# Patient Record
Sex: Male | Born: 1935 | Race: Black or African American | Hispanic: No | Marital: Married | Smoking: Former smoker
Health system: Southern US, Community
[De-identification: ages and names within clinical notes are randomized; demographics above are authoritative.]

## PROBLEM LIST (undated history)

## (undated) DIAGNOSIS — I469 Cardiac arrest, cause unspecified: Secondary | ICD-10-CM

## (undated) DIAGNOSIS — I10 Essential (primary) hypertension: Secondary | ICD-10-CM

## (undated) DIAGNOSIS — J9621 Acute and chronic respiratory failure with hypoxia: Secondary | ICD-10-CM

## (undated) DIAGNOSIS — U071 COVID-19: Secondary | ICD-10-CM

## (undated) DIAGNOSIS — I482 Chronic atrial fibrillation, unspecified: Secondary | ICD-10-CM

## (undated) HISTORY — PX: HIP SURGERY: SHX245

---

## 2001-10-08 ENCOUNTER — Ambulatory Visit (HOSPITAL_COMMUNITY): Admission: RE | Admit: 2001-10-08 | Discharge: 2001-10-08 | Payer: Self-pay | Admitting: Family Medicine

## 2001-10-08 ENCOUNTER — Encounter: Payer: Self-pay | Admitting: Family Medicine

## 2001-10-15 ENCOUNTER — Ambulatory Visit (HOSPITAL_COMMUNITY): Admission: RE | Admit: 2001-10-15 | Discharge: 2001-10-15 | Payer: Self-pay | Admitting: Orthopaedic Surgery

## 2001-10-15 ENCOUNTER — Encounter: Payer: Self-pay | Admitting: Orthopaedic Surgery

## 2014-02-23 ENCOUNTER — Ambulatory Visit (HOSPITAL_COMMUNITY)
Admission: RE | Admit: 2014-02-23 | Discharge: 2014-02-23 | Disposition: A | Discharge status: Home / Self Care | Payer: Medicare Other | Source: Ambulatory Visit | Attending: Family Medicine | Admitting: Family Medicine

## 2014-02-23 ENCOUNTER — Other Ambulatory Visit (HOSPITAL_COMMUNITY): Payer: Self-pay | Admitting: Family Medicine

## 2014-02-23 DIAGNOSIS — M25552 Pain in left hip: Secondary | ICD-10-CM | POA: Insufficient documentation

## 2014-02-23 DIAGNOSIS — M2550 Pain in unspecified joint: Secondary | ICD-10-CM

## 2014-03-10 ENCOUNTER — Encounter: Payer: Self-pay | Admitting: Orthopedic Surgery

## 2014-04-06 ENCOUNTER — Ambulatory Visit: Payer: Medicare Other | Admitting: Orthopedic Surgery

## 2014-04-07 ENCOUNTER — Ambulatory Visit (INDEPENDENT_AMBULATORY_CARE_PROVIDER_SITE_OTHER): Payer: Medicare Other | Admitting: Orthopedic Surgery

## 2014-04-07 VITALS — BP 155/92 | Ht 74.0 in | Wt 216.4 lb

## 2014-04-07 DIAGNOSIS — M1612 Unilateral primary osteoarthritis, left hip: Secondary | ICD-10-CM

## 2014-04-07 DIAGNOSIS — M5136 Other intervertebral disc degeneration, lumbar region: Secondary | ICD-10-CM

## 2014-04-07 NOTE — Patient Instructions (Signed)
CALL [facility] TO SET UP HOME EXERCISE PROGRAM
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
## 2014-10-12 ENCOUNTER — Ambulatory Visit: Payer: Medicare Other | Admitting: Orthopedic Surgery

## 2014-10-12 ENCOUNTER — Encounter: Payer: Self-pay | Admitting: Orthopedic Surgery

## 2018-06-08 ENCOUNTER — Other Ambulatory Visit: Payer: Self-pay

## 2018-06-08 ENCOUNTER — Encounter (HOSPITAL_COMMUNITY): Payer: Self-pay

## 2018-06-08 ENCOUNTER — Emergency Department (HOSPITAL_COMMUNITY)
Admission: EM | Admit: 2018-06-08 | Discharge: 2018-06-08 | Disposition: A | Discharge status: Home / Self Care | Payer: Medicare HMO | Attending: Emergency Medicine | Admitting: Emergency Medicine

## 2018-06-08 DIAGNOSIS — R42 Dizziness and giddiness: Secondary | ICD-10-CM

## 2018-06-08 DIAGNOSIS — W57XXXA Bitten or stung by nonvenomous insect and other nonvenomous arthropods, initial encounter: Secondary | ICD-10-CM | POA: Insufficient documentation

## 2018-06-08 DIAGNOSIS — S40862A Insect bite (nonvenomous) of left upper arm, initial encounter: Secondary | ICD-10-CM | POA: Diagnosis not present

## 2018-06-08 DIAGNOSIS — I10 Essential (primary) hypertension: Secondary | ICD-10-CM | POA: Insufficient documentation

## 2018-06-08 DIAGNOSIS — Y999 Unspecified external cause status: Secondary | ICD-10-CM | POA: Diagnosis not present

## 2018-06-08 DIAGNOSIS — Y929 Unspecified place or not applicable: Secondary | ICD-10-CM | POA: Insufficient documentation

## 2018-06-08 DIAGNOSIS — Y939 Activity, unspecified: Secondary | ICD-10-CM | POA: Diagnosis not present

## 2018-06-08 HISTORY — DX: Essential (primary) hypertension: I10

## 2018-06-08 NOTE — ED Triage Notes (Addendum)
Pt reports something bit him under r arm a few days ago.  Reports area is tender and he has been having some dizziness.    Pt says he fell due to dizziness while playing golf yesterday.  Denies injury.  Denies fever.

## 2018-06-08 NOTE — Discharge Instructions (Addendum)
Take 1/2 your blood pressure medication a day. Follow up with your primary care doctor this week for blood pressure recheck and recheck of your dizziness.  Make sure you are staying well hydrated. Your urine should be clear to pale yellow.  Monitor your armpit for signs of infection - look for pain, swelling, redness.  Return to the ER if you develop fevers, rash, chest pain, worsening, dizziness, or any new, worsening, or concerning symptoms.

## 2018-06-08 NOTE — ED Notes (Signed)
Pt ambulated well without assistance. Pt denies dizziness when ambulated.

## 2018-06-08 NOTE — ED Provider Notes (Signed)
[facility] EMERGENCY DEPARTMENT Provider Note   CSN: [id number] Arrival date & time: 06/08/18  [id number]    History   Chief Complaint Chief Complaint  Patient presents with  . Insect Bite    HPI Robert Carpenter is a 85 y.o. male presenting for evaluation of insect bite and dizziness.  Patient states 2 days ago he started to have itching under his right axilla.  He thinks he was bitten by something.  He reports persistent itching.  He denies pain, erythema, swelling.  He has not applied anything to this area.  He denies bite elsewhere. Additionally, patient states 2 days ago he started to have intermittent dizziness.  Dizziness is worse when he changes position, such as leaning forward or quickly turning his head.  It is described as a combination of feeling dizzy and off-balance.  No dizziness at rest.  He denies headache, vision changes, slurred speech, chest pain, shortness of breath, nausea, vomiting, cough, fevers.  He denies rashes.  He was started on lisinopril 10 mg 4 days ago, prior to this was not on any blood pressure medication.  He also takes Viagra intermittently, had a dose yesterday but none recently before that. He denies any changes, urinary symptoms, abnormal bowel movements.  He denies sick contacts at home.     HPI  Past Medical History:  Diagnosis Date  . Hypertension     There are no active problems to display for this patient.   Past Surgical History:  Procedure Laterality Date  . HIP SURGERY          Home Medications    Prior to Admission medications   Medication Sig Start Date End Date Taking? Authorizing Provider  IBUPROFEN PO Take by mouth.    [provider]    Family History No family history on file.  Social History Social History   Tobacco Use  . Smoking status: Never Smoker  . Smokeless tobacco: Never Used  Substance Use Topics  . Alcohol use: Never    Frequency: Never  . Drug use: Never     Allergies   Patient has  no known allergies.   Review of Systems Review of Systems  Skin:       Insect R axilla  Neurological: Positive for dizziness (intermittent).  All other systems reviewed and are negative.    Physical Exam Updated Vital Signs BP (!) 152/64 (BP Location: Left Arm)   Pulse (!) 58   Temp 98.6 F (37 C) (Oral)   Resp (!) 22   Ht 6\' 1"  (1.854 m)   Wt 88 kg   SpO2 99%   BMI 25.60 kg/m   Physical Exam Vitals signs and nursing note reviewed.  Constitutional:      General: He is not in acute distress.    Appearance: He is well-developed.     Comments: Resting comfortable in the bed in NAD  HENT:     Head: Normocephalic and atraumatic.  Eyes:     Extraocular Movements: Extraocular movements intact.     Conjunctiva/sclera: Conjunctivae normal.     Pupils: Pupils are equal, round, and reactive to light.     Comments: EOMI and PERRLA. No nystagmus  Neck:     Musculoskeletal: Normal range of motion and neck supple.  Cardiovascular:     Rate and Rhythm: Normal rate and regular rhythm.     Pulses: Normal pulses.  Pulmonary:     Effort: Pulmonary effort is normal. No respiratory distress.  Breath sounds: Normal breath sounds. No wheezing.     Comments: Clear lung sounds in all fields Abdominal:     General: There is no distension.     Palpations: Abdomen is soft. There is no mass.     Tenderness: There is no abdominal tenderness. There is no guarding or rebound.  Musculoskeletal: Normal range of motion.     Comments: Strength and sensation intact x4.   Skin:    General: Skin is warm and dry.     Capillary Refill: Capillary refill takes less than 2 seconds.     Findings: No rash.     Comments: No rash noted.  2 mm tick of R axilla. No surrounding tenderness, inflammation, erythema, or induration.   Neurological:     General: No focal deficit present.     Mental Status: He is alert and oriented to person, place, and time.     GCS: GCS eye subscore is 4. GCS verbal subscore  is 5. GCS motor subscore is 6.     Cranial Nerves: Cranial nerves are intact.     Sensory: Sensation is intact.     Motor: Motor function is intact.     Coordination: Coordination is intact.     Gait: Gait is intact.      ED Treatments / Results  Labs (all labs ordered are listed, but only abnormal results are displayed) Labs Reviewed - No data to display  EKG EKG Interpretation  Date/Time:  Sunday June 08 2018 09:18:22 EDT Ventricular Rate:  54 PR Interval:    QRS Duration: 157 QT Interval:  469 QTC Calculation: 445 R Axis:   -52 Text Interpretation:  Sinus rhythm Prolonged PR interval Left bundle branch block Confirmed by Nat Christen [phone]) on 06/08/2018 10:45:27 AM   Radiology No results found.  Procedures .Foreign Body Removal Date/Time: 06/08/2018 9:58 AM Performed by: Franchot Heidelberg, PA-C Authorized by: Franchot Heidelberg, PA-C  Consent: Verbal consent obtained. Risks and benefits: risks, benefits and alternatives were discussed Consent given by: patient Patient understanding: patient states understanding of the procedure being performed Patient consent: the patient's understanding of the procedure matches consent given Procedure consent: procedure consent matches procedure scheduled Relevant documents: relevant documents present and verified Patient identity confirmed: verbally with patient Intake: R axilla. Anesthesia method: none.  Sedation: Patient sedated: no  Patient restrained: no Complexity: simple 1 objects recovered. Objects recovered: tick Post-procedure assessment: foreign body removed Patient tolerance: Patient tolerated the procedure well with no immediate complications   (including critical care time)  Medications Ordered in ED Medications - No data to display   Initial Impression / Assessment and Plan / ED Course  I have reviewed the triage vital signs and the nursing notes.  Pertinent labs & imaging results that were available  during my care of the patient were reviewed by me and considered in my medical decision making (see chart for details).        Pt presenting for intermittent dizziness and insect bite. physical exam shows tick of the R axilla without signs of infection. Neuro exam reassuring, no deficits. As he recently started BP meds, postural dizziness like 2/2 new medication, lower suspicion that it is due to tick bite.   Tick removed as described above. No fever, rash, or signs of RMSF. Case discussed with attending, Dr. Lacinda Axon evaluated the pt. EKG shows LBBB, no previous to compare. Pt without any ACS sxs such as cp or SOB. Doubt ACS or cardiac etiology in the setting  of intermitted dizziness with new BP mediation. Discussed with attending, Dr. Adriana Simasook agrees to hold on further blood work or cardiac workup. Will do orthostatics and ambulate pt. If reassuring,w ill d/c with 1/2 BP med dose and close follow up with PCP.    Pt ambulated without difficulty or dizziness. Orthostatics reassuring. Discussed findings and plan with pt. discussed taking 1/2 bp med, and f/u with pcp. Strict return precautions given. At this time, pt appears safe for d/c. Pt states he understands and agrees to plan.    Final Clinical Impressions(s) / ED Diagnoses   Final diagnoses:  Tick bite, initial encounter  Dizziness    ED Discharge Orders    None       Alveria ApleyCaccavale, Quantel Mcinturff, PA-C 06/08/18 1123    Donnetta Hutchingook, Brian, MD 06/08/18 1321

## 2018-09-02 ENCOUNTER — Ambulatory Visit (INDEPENDENT_AMBULATORY_CARE_PROVIDER_SITE_OTHER): Payer: Medicare HMO | Admitting: Orthopaedic Surgery

## 2018-09-02 ENCOUNTER — Other Ambulatory Visit: Payer: Self-pay

## 2018-09-02 ENCOUNTER — Ambulatory Visit: Payer: Medicare HMO

## 2018-09-02 ENCOUNTER — Encounter: Payer: Self-pay | Admitting: Orthopaedic Surgery

## 2018-09-02 VITALS — BP 152/78 | HR 50 | Temp 98.0°F | Ht 74.25 in | Wt 192.1 lb

## 2018-09-02 DIAGNOSIS — M25562 Pain in left knee: Secondary | ICD-10-CM

## 2018-09-02 DIAGNOSIS — G8929 Other chronic pain: Secondary | ICD-10-CM | POA: Diagnosis not present

## 2018-09-02 NOTE — Progress Notes (Signed)
Subjective:    Patient ID: Robert Carpenter, male    DOB: 1933-03-06, 85 y.o.   MRN: [id number]  HPI Robert Carpenter has had left knee pain for six to eight months getting gradually worse. Robert Carpenter has no trauma, no redness, no numbness.  Robert Carpenter has swelling and popping.  Robert Carpenter has tried heat, ice, Tylenol with no help.  It is no better. The right knee hurts at times.     Review of Systems  Constitutional: Positive for activity change.  Musculoskeletal: Positive for gait problem and joint swelling.  All other systems reviewed and are negative.  For Review of Systems, all other systems reviewed and are negative.  The following is a summary of the past history medically, past history surgically, known current medicines, social history and family history.  This information is gathered electronically by the computer from prior information and documentation.  I review this each visit and have found including this information at this point in the chart is beneficial and informative.   Past Medical History:  Diagnosis Date  . Hypertension     Past Surgical History:  Procedure Laterality Date  . HIP SURGERY      Current Outpatient Medications on File Prior to Visit  Medication Sig Dispense Refill  . IBUPROFEN PO Take by mouth.     No current facility-administered medications on file prior to visit.     Social History   Socioeconomic History  . Marital status: Married    Spouse name: Not on file  . Number of children: Not on file  . Years of education: Not on file  . Highest education level: Not on file  Occupational History  . Not on file  Social Needs  . Financial resource strain: Not on file  . Food insecurity    Worry: Not on file    Inability: Not on file  . Transportation needs    Medical: Not on file    Non-medical: Not on file  Tobacco Use  . Smoking status: Never Smoker  . Smokeless tobacco: Never Used  Substance and Sexual Activity  . Alcohol use: Never    Frequency: Never  . Drug  use: Never  . Sexual activity: Not on file  Lifestyle  . Physical activity    Days per week: Not on file    Minutes per session: Not on file  . Stress: Not on file  Relationships  . Social Herbalist on phone: Not on file    Gets together: Not on file    Attends religious service: Not on file    Active member of club or organization: Not on file    Attends meetings of clubs or organizations: Not on file    Relationship status: Not on file  . Intimate partner violence    Fear of current or ex partner: Not on file    Emotionally abused: Not on file    Physically abused: Not on file    Forced sexual activity: Not on file  Other Topics Concern  . Not on file  Social History Narrative  . Not on file    History reviewed. No pertinent family history.  BP (!) 152/78   Pulse (!) 50   Temp 98 F (36.7 C)   Ht 6' 2.25" (1.886 m)   Wt 192 lb 2 oz (87.1 kg)   BMI 24.50 kg/m   Body mass index is 24.5 kg/m.     Objective:   Physical Exam Vitals  signs reviewed.  Constitutional:      Appearance: Robert Carpenter is well-developed.  HENT:     Head: Normocephalic and atraumatic.  Eyes:     Conjunctiva/sclera: Conjunctivae normal.     Pupils: Pupils are equal, round, and reactive to light.  Neck:     Musculoskeletal: Normal range of motion and neck supple.  Cardiovascular:     Rate and Rhythm: Normal rate and regular rhythm.  Pulmonary:     Effort: Pulmonary effort is normal.  Abdominal:     Palpations: Abdomen is soft.  Musculoskeletal:       Legs:  Skin:    General: Skin is warm and dry.  Neurological:     Mental Status: Robert Carpenter is alert and oriented to person, place, and time.     Cranial Nerves: No cranial nerve deficit.     Motor: No abnormal muscle tone.     Coordination: Coordination normal.     Deep Tendon Reflexes: Reflexes are normal and symmetric. Reflexes normal.  Psychiatric:        Behavior: Behavior normal.        Thought Content: Thought content normal.         Judgment: Judgment normal.    X-rays were done of the left knee, reported separately.  Robert Carpenter has moderate DJD more medially.       Assessment & Plan:   Encounter Diagnosis  Name Primary?  . Chronic pain of left knee Yes   PROCEDURE NOTE:  The patient request injection, verbal consent was obtained.  The left knee was prepped appropriately after time out was performed.   Sterile technique was observed and anesthesia was provided by ethyl chloride and a 20-gauge needle was used to inject the knee area.  A 16-gauge needle was then used to aspirate the knee.  Color of fluid aspirated was straw  Total cc's aspirated was 20.    Injection of 1 cc of Depo-Medrol 40 mg with several cc's of plain xylocaine was then performed.  A band aid dressing was applied.  The patient was advised to apply ice later today and tomorrow to the injection sight as needed.  I showed him the x-rays and explained them to him.  Return in three weeks.  I told him to take one Aleve bid.  Call if any problem.  Precautions discussed.   Electronically Signed Darreld McleanWayne Farra Nikolic, MD 9/1/202011:03 AM

## 2018-09-23 ENCOUNTER — Other Ambulatory Visit: Payer: Self-pay

## 2018-09-23 ENCOUNTER — Ambulatory Visit (INDEPENDENT_AMBULATORY_CARE_PROVIDER_SITE_OTHER): Payer: Medicare HMO | Admitting: Orthopaedic Surgery

## 2018-09-23 ENCOUNTER — Encounter: Payer: Self-pay | Admitting: Orthopaedic Surgery

## 2018-09-23 VITALS — BP 150/84 | HR 58 | Temp 97.9°F | Ht 74.0 in | Wt 191.0 lb

## 2018-09-23 DIAGNOSIS — G8929 Other chronic pain: Secondary | ICD-10-CM | POA: Diagnosis not present

## 2018-09-23 DIAGNOSIS — M25562 Pain in left knee: Secondary | ICD-10-CM

## 2018-09-23 NOTE — Progress Notes (Signed)
CC:  My knee is much better  He has little pain of the left knee.  He has no swelling.  He is walking well.  The injection helped.  Encounter Diagnosis  Name Primary?  . Chronic pain of left knee Yes   I will see as needed.  Call if any problem.  Precautions discussed.   Electronically Signed Sanjuana Kava, MD 9/22/20209:31 AM

## 2019-01-13 DIAGNOSIS — I10 Essential (primary) hypertension: Secondary | ICD-10-CM | POA: Diagnosis not present

## 2019-01-13 DIAGNOSIS — Z9119 Patient's noncompliance with other medical treatment and regimen: Secondary | ICD-10-CM | POA: Diagnosis not present

## 2019-01-13 DIAGNOSIS — K5900 Constipation, unspecified: Secondary | ICD-10-CM | POA: Diagnosis not present

## 2019-04-29 DIAGNOSIS — H6693 Otitis media, unspecified, bilateral: Secondary | ICD-10-CM | POA: Diagnosis not present

## 2019-04-29 DIAGNOSIS — H9203 Otalgia, bilateral: Secondary | ICD-10-CM | POA: Diagnosis not present

## 2019-06-17 DIAGNOSIS — I447 Left bundle-branch block, unspecified: Secondary | ICD-10-CM | POA: Diagnosis not present

## 2019-06-17 DIAGNOSIS — I10 Essential (primary) hypertension: Secondary | ICD-10-CM | POA: Diagnosis not present

## 2019-06-17 DIAGNOSIS — Z0001 Encounter for general adult medical examination with abnormal findings: Secondary | ICD-10-CM | POA: Diagnosis not present

## 2019-06-17 DIAGNOSIS — K5900 Constipation, unspecified: Secondary | ICD-10-CM | POA: Diagnosis not present

## 2019-07-21 DIAGNOSIS — I10 Essential (primary) hypertension: Secondary | ICD-10-CM | POA: Diagnosis not present

## 2019-07-23 DIAGNOSIS — I10 Essential (primary) hypertension: Secondary | ICD-10-CM | POA: Diagnosis not present

## 2019-07-23 DIAGNOSIS — Z79899 Other long term (current) drug therapy: Secondary | ICD-10-CM | POA: Diagnosis not present

## 2019-12-14 ENCOUNTER — Encounter (HOSPITAL_COMMUNITY): Payer: Self-pay | Admitting: Emergency Medicine

## 2019-12-14 ENCOUNTER — Other Ambulatory Visit: Payer: Self-pay

## 2019-12-14 ENCOUNTER — Emergency Department (HOSPITAL_COMMUNITY): Payer: Medicare Other

## 2019-12-14 ENCOUNTER — Emergency Department (HOSPITAL_COMMUNITY)
Admission: EM | Admit: 2019-12-14 | Discharge: 2019-12-15 | Disposition: A | Discharge status: Home / Self Care | Payer: Medicare Other | Attending: Emergency Medicine | Admitting: Emergency Medicine

## 2019-12-14 DIAGNOSIS — I10 Essential (primary) hypertension: Secondary | ICD-10-CM | POA: Insufficient documentation

## 2019-12-14 DIAGNOSIS — R739 Hyperglycemia, unspecified: Secondary | ICD-10-CM | POA: Diagnosis not present

## 2019-12-14 DIAGNOSIS — R410 Disorientation, unspecified: Secondary | ICD-10-CM | POA: Insufficient documentation

## 2019-12-14 DIAGNOSIS — M1612 Unilateral primary osteoarthritis, left hip: Secondary | ICD-10-CM | POA: Diagnosis not present

## 2019-12-14 DIAGNOSIS — R531 Weakness: Secondary | ICD-10-CM | POA: Diagnosis present

## 2019-12-14 LAB — BASIC METABOLIC PANEL
Anion gap: 10 (ref 5–15)
BUN: 33 mg/dL — ABNORMAL HIGH (ref 8–23)
CO2: 22 mmol/L (ref 22–32)
Calcium: 8.9 mg/dL (ref 8.9–10.3)
Chloride: 100 mmol/L (ref 98–111)
Creatinine, Ser: 1.98 mg/dL — ABNORMAL HIGH (ref 0.61–1.24)
GFR, Estimated: 33 mL/min — ABNORMAL LOW (ref >60)
Glucose, Bld: 242 mg/dL — ABNORMAL HIGH (ref 70–99)
Potassium: 4 mmol/L (ref 3.5–5.1)
Sodium: 132 mmol/L — ABNORMAL LOW (ref 135–145)

## 2019-12-14 LAB — CBC
HCT: 41.7 % (ref 39.0–52.0)
Hemoglobin: 14.4 g/dL (ref 13.0–17.0)
MCH: 32.3 pg (ref 26.0–34.0)
MCHC: 34.5 g/dL (ref 30.0–36.0)
MCV: 93.5 fL (ref 80.0–100.0)
Platelets: 146 10*3/uL — ABNORMAL LOW (ref 150–400)
RBC: 4.46 MIL/uL (ref 4.22–5.81)
RDW: 13.2 % (ref 11.5–15.5)
WBC: 4.1 10*3/uL (ref 4.0–10.5)
nRBC: 0 % (ref 0.0–0.2)

## 2019-12-14 LAB — HEPATIC FUNCTION PANEL
ALT: 41 U/L (ref 0–44)
AST: 66 U/L — ABNORMAL HIGH (ref 15–41)
Albumin: 3.7 g/dL (ref 3.5–5.0)
Alkaline Phosphatase: 46 U/L (ref 38–126)
Bilirubin, Direct: 0.2 mg/dL (ref 0.0–0.2)
Indirect Bilirubin: 0.5 mg/dL (ref 0.3–0.9)
Total Bilirubin: 0.7 mg/dL (ref 0.3–1.2)
Total Protein: 7.5 g/dL (ref 6.5–8.1)

## 2019-12-14 MED ORDER — SODIUM CHLORIDE 0.9 % IV BOLUS
1000.0000 mL | Freq: Once | INTRAVENOUS | Status: AC
  Administered 2019-12-14: 1000 mL via INTRAVENOUS

## 2019-12-14 NOTE — ED Triage Notes (Addendum)
Pt c/o "not feeling like doing anything". Pt states hes not tired, he just doesn't feel like doing anything x 2 weeks. Pt son states hes been confused at night, having issues with his memory and his mind being "foggy" x a few days.

## 2019-12-14 NOTE — ED Notes (Signed)
Patient transported to CT 

## 2019-12-14 NOTE — ED Provider Notes (Signed)
[facility] EMERGENCY DEPARTMENT Provider Note   CSN: [id number] Arrival date & time: 12/14/19  1540     History Chief Complaint  Patient presents with  . Weakness    Robert Carpenter is a 86 y.o. male.  Patient has been more confused over the last few weeks.  He has a history of urinary tract infection before  The history is provided by the patient and medical records. No language interpreter was used.  Altered Mental Status Presenting symptoms: no behavior changes   Severity:  Mild Most recent episode:  More than 2 days ago Episode history:  Continuous Timing:  Constant Progression:  Waxing and waning Chronicity:  New Context: dementia   Associated symptoms: no abdominal pain, no hallucinations, no headaches, no rash and no seizures        Past Medical History:  Diagnosis Date  . Hypertension     There are no problems to display for this patient.   Past Surgical History:  Procedure Laterality Date  . HIP SURGERY         History reviewed. No pertinent family history.  Social History   Tobacco Use  . Smoking status: Never Smoker  . Smokeless tobacco: Never Used  Substance Use Topics  . Alcohol use: Never  . Drug use: Never    Home Medications Prior to Admission medications   Medication Sig Start Date End Date Taking? Authorizing Provider  IBUPROFEN PO Take by mouth.    [provider]    Allergies    Patient has no known allergies.  Review of Systems   Review of Systems  Constitutional: Negative for appetite change and fatigue.  HENT: Negative for congestion, ear discharge and sinus pressure.   Eyes: Negative for discharge.  Respiratory: Negative for cough.   Cardiovascular: Negative for chest pain.  Gastrointestinal: Negative for abdominal pain and diarrhea.  Genitourinary: Negative for frequency and hematuria.  Musculoskeletal: Negative for back pain.  Skin: Negative for rash.  Neurological: Negative for seizures and  headaches.  Psychiatric/Behavioral: Negative for hallucinations.    Physical Exam Updated Vital Signs BP 106/60 (BP Location: Right Arm)   Pulse 67   Temp 98.9 F (37.2 C) (Oral)   Resp 17   Ht 6\' 2"  (1.88 m)   Wt 88.5 kg   SpO2 97%   BMI 25.04 kg/m   Physical Exam Vitals reviewed.  Constitutional:      Appearance: He is well-developed.  HENT:     Head: Normocephalic.  Eyes:     General: No scleral icterus.    Extraocular Movements: EOM normal.     Conjunctiva/sclera: Conjunctivae normal.  Neck:     Thyroid: No thyromegaly.  Cardiovascular:     Rate and Rhythm: Normal rate and regular rhythm.     Heart sounds: No murmur heard. No friction rub. No gallop.   Pulmonary:     Breath sounds: No stridor. No wheezing or rales.  Chest:     Chest wall: No tenderness.  Abdominal:     General: There is no distension.     Tenderness: There is no abdominal tenderness. There is no rebound.  Musculoskeletal:        General: No edema. Normal range of motion.     Cervical back: Neck supple.     Comments: Tender left hip  Lymphadenopathy:     Cervical: No cervical adenopathy.  Skin:    Findings: No erythema or rash.  Neurological:     Mental Status: He  is alert and oriented to person, place, and time.     Motor: No abnormal muscle tone.     Coordination: Coordination normal.  Psychiatric:        Mood and Affect: Mood and affect normal.        Behavior: Behavior normal.     ED Results / Procedures / Treatments   Labs (all labs ordered are listed, but only abnormal results are displayed) Labs Reviewed  BASIC METABOLIC PANEL - Abnormal; Notable for the following components:      Result Value   Sodium 132 (*)    Glucose, Bld 242 (*)    BUN 33 (*)    Creatinine, Ser 1.98 (*)    GFR, Estimated 33 (*)    All other components within normal limits  CBC - Abnormal; Notable for the following components:   Platelets 146 (*)    All other components within normal limits   URINALYSIS, ROUTINE W REFLEX MICROSCOPIC  HEPATIC FUNCTION PANEL    EKG None  Radiology No results found.  Procedures Procedures (including critical care time)  Medications Ordered in ED Medications  sodium chloride 0.9 % bolus 1,000 mL (has no administration in time range)    ED Course  I have reviewed the triage vital signs and the nursing notes.  Pertinent labs & imaging results that were available during my care of the patient were reviewed by me and considered in my medical decision making (see chart for details).    MDM Rules/Calculators/A&P                          Patient labs are unremarkable except for elevated glucose.  Patient does not know if he is on anything for diabetes.  X-ray showed some arthritis in his hip and will send him home with some Motrin and he will follow-up with his PCP Final Clinical Impression(s) / ED Diagnoses Final diagnoses:  None    Rx / DC Orders ED Discharge Orders    None       Bethann Berkshire, MD 12/15/19 0017

## 2019-12-15 LAB — URINALYSIS, ROUTINE W REFLEX MICROSCOPIC
Bacteria, UA: NONE SEEN
Bilirubin Urine: NEGATIVE
Glucose, UA: 150 mg/dL — AB
Hgb urine dipstick: NEGATIVE
Ketones, ur: 5 mg/dL — AB
Leukocytes,Ua: NEGATIVE
Nitrite: NEGATIVE
Protein, ur: 100 mg/dL — AB
Specific Gravity, Urine: 1.026 (ref 1.005–1.030)
pH: 5 (ref 5.0–8.0)

## 2019-12-15 MED ORDER — IBUPROFEN 800 MG PO TABS: 800.0000 mg | ORAL_TABLET | Freq: Three times a day (TID) | ORAL | 0 refills | Status: DC | PRN

## 2019-12-15 MED ORDER — ACETAMINOPHEN 325 MG PO TABS
650.0000 mg | ORAL_TABLET | Freq: Once | ORAL | Status: AC
  Administered 2019-12-15: 650 mg via ORAL
  Filled 2019-12-15: qty 2

## 2019-12-15 NOTE — Discharge Instructions (Signed)
Follow-up with your family doctor this week or next week.  Make sure you bring a family member with you to go over your medicine

## 2020-02-19 DIAGNOSIS — Z86718 Personal history of other venous thrombosis and embolism: Secondary | ICD-10-CM | POA: Insufficient documentation

## 2020-02-19 DIAGNOSIS — Z8674 Personal history of sudden cardiac arrest: Secondary | ICD-10-CM | POA: Insufficient documentation

## 2020-03-09 ENCOUNTER — Inpatient Hospital Stay
Admission: EM | Admit: 2020-03-09 | Discharge: 2020-03-29 | Disposition: A | Discharge status: Skilled Nursing Facility | Payer: Medicare Other | Attending: Internal Medicine | Admitting: Internal Medicine

## 2020-03-09 ENCOUNTER — Other Ambulatory Visit (HOSPITAL_COMMUNITY): Payer: Medicare Other

## 2020-03-09 ENCOUNTER — Ambulatory Visit (HOSPITAL_COMMUNITY)
Admission: AD | Admit: 2020-03-09 | Discharge: 2020-03-09 | Disposition: A | Discharge status: Home / Self Care | Payer: Medicare Other | Source: Other Acute Inpatient Hospital | Attending: Internal Medicine | Admitting: Internal Medicine

## 2020-03-09 DIAGNOSIS — Z931 Gastrostomy status: Secondary | ICD-10-CM

## 2020-03-09 DIAGNOSIS — J9690 Respiratory failure, unspecified, unspecified whether with hypoxia or hypercapnia: Secondary | ICD-10-CM

## 2020-03-09 DIAGNOSIS — J9621 Acute and chronic respiratory failure with hypoxia: Secondary | ICD-10-CM | POA: Insufficient documentation

## 2020-03-09 DIAGNOSIS — J189 Pneumonia, unspecified organism: Secondary | ICD-10-CM

## 2020-03-09 DIAGNOSIS — I469 Cardiac arrest, cause unspecified: Secondary | ICD-10-CM | POA: Diagnosis present

## 2020-03-09 DIAGNOSIS — I482 Chronic atrial fibrillation, unspecified: Secondary | ICD-10-CM | POA: Diagnosis not present

## 2020-03-09 DIAGNOSIS — U071 COVID-19: Secondary | ICD-10-CM | POA: Diagnosis not present

## 2020-03-09 DIAGNOSIS — Z431 Encounter for attention to gastrostomy: Secondary | ICD-10-CM

## 2020-03-09 HISTORY — DX: Cardiac arrest, cause unspecified: I46.9

## 2020-03-09 HISTORY — DX: COVID-19: U07.1

## 2020-03-09 HISTORY — DX: Acute and chronic respiratory failure with hypoxia: J96.21

## 2020-03-09 HISTORY — DX: Chronic atrial fibrillation, unspecified: I48.20

## 2020-03-10 ENCOUNTER — Encounter: Payer: Self-pay | Admitting: Internal Medicine

## 2020-03-10 ENCOUNTER — Other Ambulatory Visit (HOSPITAL_COMMUNITY): Payer: Medicare Other

## 2020-03-10 DIAGNOSIS — U071 COVID-19: Secondary | ICD-10-CM | POA: Diagnosis not present

## 2020-03-10 DIAGNOSIS — I482 Chronic atrial fibrillation, unspecified: Secondary | ICD-10-CM | POA: Diagnosis not present

## 2020-03-10 DIAGNOSIS — J9621 Acute and chronic respiratory failure with hypoxia: Secondary | ICD-10-CM

## 2020-03-10 DIAGNOSIS — I469 Cardiac arrest, cause unspecified: Secondary | ICD-10-CM | POA: Diagnosis not present

## 2020-03-10 LAB — COMPREHENSIVE METABOLIC PANEL
ALT: 29 U/L (ref 0–44)
AST: 26 U/L (ref 15–41)
Albumin: 2.5 g/dL — ABNORMAL LOW (ref 3.5–5.0)
Alkaline Phosphatase: 59 U/L (ref 38–126)
Anion gap: 6 (ref 5–15)
BUN: 13 mg/dL (ref 8–23)
CO2: 29 mmol/L (ref 22–32)
Calcium: 9.4 mg/dL (ref 8.9–10.3)
Chloride: 101 mmol/L (ref 98–111)
Creatinine, Ser: 0.88 mg/dL (ref 0.61–1.24)
GFR, Estimated: >60 mL/min (ref >60)
Glucose, Bld: 109 mg/dL — ABNORMAL HIGH (ref 70–99)
Potassium: 3.9 mmol/L (ref 3.5–5.1)
Sodium: 136 mmol/L (ref 135–145)
Total Bilirubin: 0.7 mg/dL (ref 0.3–1.2)
Total Protein: 7.7 g/dL (ref 6.5–8.1)

## 2020-03-10 LAB — CBC WITH DIFFERENTIAL/PLATELET
Abs Immature Granulocytes: 0.02 10*3/uL (ref 0.00–0.07)
Basophils Absolute: 0 10*3/uL (ref 0.0–0.1)
Basophils Relative: 1 %
Eosinophils Absolute: 0.3 10*3/uL (ref 0.0–0.5)
Eosinophils Relative: 4 %
HCT: 36.7 % — ABNORMAL LOW (ref 39.0–52.0)
Hemoglobin: 12.5 g/dL — ABNORMAL LOW (ref 13.0–17.0)
Immature Granulocytes: 0 %
Lymphocytes Relative: 41 %
Lymphs Abs: 2.9 10*3/uL (ref 0.7–4.0)
MCH: 32 pg (ref 26.0–34.0)
MCHC: 34.1 g/dL (ref 30.0–36.0)
MCV: 93.9 fL (ref 80.0–100.0)
Monocytes Absolute: 0.6 10*3/uL (ref 0.1–1.0)
Monocytes Relative: 9 %
Neutro Abs: 3.2 10*3/uL (ref 1.7–7.7)
Neutrophils Relative %: 45 %
Platelets: 366 10*3/uL (ref 150–400)
RBC: 3.91 MIL/uL — ABNORMAL LOW (ref 4.22–5.81)
RDW: 14.1 % (ref 11.5–15.5)
WBC: 7.1 10*3/uL (ref 4.0–10.5)
nRBC: 0 % (ref 0.0–0.2)

## 2020-03-10 LAB — HEMOGLOBIN A1C
Hgb A1c MFr Bld: 5.8 % — ABNORMAL HIGH (ref 4.8–5.6)
Mean Plasma Glucose: 119.76 mg/dL

## 2020-03-10 LAB — PHOSPHORUS: Phosphorus: 3.1 mg/dL (ref 2.5–4.6)

## 2020-03-10 LAB — MAGNESIUM: Magnesium: 2 mg/dL (ref 1.7–2.4)

## 2020-03-10 LAB — PROTIME-INR
INR: 1.4 — ABNORMAL HIGH (ref 0.8–1.2)
Prothrombin Time: 16.2 seconds — ABNORMAL HIGH (ref 11.4–15.2)

## 2020-03-10 NOTE — Progress Notes (Signed)
Interventional Radiology Progress Note  Robert Carpenter is an 87 year old man with history of acute-on-chronic respiratory failure s/p trach placement with G-tube for long-term nutrition last replaced by IR at [facility].  Patient now admitted to [facility] for ongoing rehabillitation.  IR consulted for clogged G-tube.   PA to beside this AM to attempt to declog gastrostomy tube.   Physical Exam: NAD, trach in place, resting comfortably in bed.  Abdomen: Balloon-retention G-tube in place with stopcock adapter. Dried TF in tubing. Site intact, clean, dry.  No degradation of the tube.   Declogged gastrostomy tube with syringe injection wit h minimal effort.  Able to aspirate gastric contents and clean out tube.  G-tube currently functioning well. Ready for use.   [name], MS RD PA-C 2:09 PM    I spent a total of 20 Minutes    in face to face in clinical consultation, greater than 50% of which was counseling/coordinating care for dysphagia, gastrostomy tube malfunction.

## 2020-03-10 NOTE — Consult Note (Signed)
Pulmonary Critical Care Medicine [facility] [place]  PULMONARY SERVICE  Date of Service: 03/10/2020  PULMONARY CRITICAL CARE CONSULT   Robert Carpenter  WLN:[id number]  DOB: January 18, 1933   DOA: 03/09/2020  Referring Physician: Carron Curie, MD  HPI: Robert Carpenter is a 87 y.o. male seen for follow up of Acute on Chronic Respiratory Failure.  Patient has multiple medical problems including atrial fibrillation cardiac arrest weakness G-tube hypertension DVT thyroid nodule came into the hospital with a diagnosis of COVID-19 virus infection.  Patient was transferred to [facility] back in December and had a trach placed on January 24.  The tracheostomy apparently underwent a change on February 8 and patient subsequently pulled out his trach and coded suffered a cardiac arrest.  Patient did undergo CPR with return of circulation.  Patient also suffered ischemia from demand.  Other complicated course included development of sepsis with shock.  The patient is now transferred to our facility for further management possibility of weaning assessment.  At the time presentation patient is on T collar on 28% using the PMV  Review of Systems:  ROS performed and is unremarkable other than noted above.  Past Medical History:  Diagnosis Date  . Hypertension     Past Surgical History:  Procedure Laterality Date  . HIP SURGERY      Social History:    reports that he has never smoked. He has never used smokeless tobacco. He reports that he does not drink alcohol and does not use drugs.  Family History: Non-Contributory to the present illness  No Known Allergies  Medications: Reviewed on Rounds  Physical Exam:  Vitals: Temperature 97.8 pulse 72 respiratory rate is 29 blood pressure 120/67 saturations 99%  Ventilator Settings off the ventilator on T collar FiO2 28%  . General: Comfortable at this time . Eyes: Grossly normal lids, irises & conjunctiva . ENT: grossly tongue is  normal . Neck: no obvious mass . Cardiovascular: S1-S2 normal no gallop or rub . Respiratory: No rhonchi no rales are noted . Abdomen: Soft and nontender . Skin: no rash seen on limited exam . Musculoskeletal: not rigid . Psychiatric:unable to assess . Neurologic: no seizure no involuntary movements         Labs on Admission:  Basic Metabolic Panel: Recent Labs  Lab 03/10/20 0409  NA 136  K 3.9  CL 101  CO2 29  GLUCOSE 109*  BUN 13  CREATININE 0.88  CALCIUM 9.4  MG 2.0  PHOS 3.1    No results for input(s): PHART, PCO2ART, PO2ART, HCO3, O2SAT in the last 168 hours.  Liver Function Tests: Recent Labs  Lab 03/10/20 0409  AST 26  ALT 29  ALKPHOS 59  BILITOT 0.7  PROT 7.7  ALBUMIN 2.5*   No results for input(s): LIPASE, AMYLASE in the last 168 hours. No results for input(s): AMMONIA in the last 168 hours.  CBC: Recent Labs  Lab 03/10/20 0409  WBC 7.1  NEUTROABS 3.2  HGB 12.5*  HCT 36.7*  MCV 93.9  PLT 366    Cardiac Enzymes: No results for input(s): CKTOTAL, CKMB, CKMBINDEX, TROPONINI in the last 168 hours.  BNP (last 3 results) No results for input(s): BNP in the last 8760 hours.  ProBNP (last 3 results) No results for input(s): PROBNP in the last 8760 hours.   Radiological Exams on Admission: DG CHEST PORT 1 VIEW  Result Date: 03/10/2020 CLINICAL DATA:  Respiratory failure. EXAM: PORTABLE CHEST 1 VIEW COMPARISON:  Chest  x-ray 03/03/2020. FINDINGS: Interim removal of left PICC line. Tracheostomy tube in stable position. Stable cardiomegaly. Multifocal bilateral pulmonary infiltrates again noted. Interim slight improvement on the right. Stable elevation left hemidiaphragm. No pleural effusion or pneumothorax. Stable cardiomegaly. IMPRESSION: 1. Interim removal of left PICC line. Tracheostomy tube in stable position. 2. Multifocal bilateral pulmonary infiltrates again noted. Interim slight improvement on the right. Electronically Signed   By: Maisie Fus   Register   On: 03/10/2020 05:47   DG Abd Portable 1V  Result Date: 03/09/2020 CLINICAL DATA:  PEG status.  Respiratory failure. EXAM: PORTABLE ABDOMEN - 1 VIEW COMPARISON:  Abdominal CT 01/27/2020.  Chest radiographs 03/03/2020. FINDINGS: 1427 hours. The balloon of a percutaneous feeding tube projects over the left upper quadrant of the abdomen. No visible enteric contrast to assess the specific location of this to. The bowel gas pattern is nonobstructive. Contrast is present within scattered colonic diverticula. Bibasilar airspace opacities and diffuse lumbar spondylosis are noted. IMPRESSION: The balloon of a percutaneous feeding tube projects over the left upper quadrant of the abdomen. More specific localization of the tube could be performed by repeating a radiograph after instilling contrast through it. Electronically Signed   By: Carey Bullocks M.D.   On: 03/09/2020 15:04    Assessment/Plan Active Problems:   Acute on chronic respiratory failure with hypoxia (HCC)   Chronic atrial fibrillation (HCC)   Cardiac arrest (HCC)   COVID-19 virus infection   1. Acute on chronic respiratory failure hypoxia plan is to continue with the T collar and PMV looks like he is doing quite well.  I will have to do a little bit more research as far as whether he is going to be ultimately decannulated appears the patient had a trach in place back in January and when the patient pulled the trach out apparently coded. 2. Chronic atrial fibrillation rate now rate is controlled we will continue to monitor on telemetry. 3. Cardiac arrest right now rhythm is stable we will continue to follow along. 4. COVID-19 virus infection in recovery we will continue with supportive care  I have personally seen and evaluated the patient, evaluated laboratory and imaging results, formulated the assessment and plan and placed orders. The Patient requires high complexity decision making with multiple systems involvement.  Case  was discussed on Rounds with the Respiratory Therapy Director and the Respiratory staff Time Spent  Yevonne Pax, MD [facility] Pulmonary Critical Care Medicine Sleep Medicine

## 2020-03-11 DIAGNOSIS — I482 Chronic atrial fibrillation, unspecified: Secondary | ICD-10-CM | POA: Diagnosis not present

## 2020-03-11 DIAGNOSIS — I469 Cardiac arrest, cause unspecified: Secondary | ICD-10-CM | POA: Diagnosis not present

## 2020-03-11 DIAGNOSIS — U071 COVID-19: Secondary | ICD-10-CM | POA: Diagnosis not present

## 2020-03-11 DIAGNOSIS — J9621 Acute and chronic respiratory failure with hypoxia: Secondary | ICD-10-CM | POA: Diagnosis not present

## 2020-03-11 NOTE — Progress Notes (Signed)
Pulmonary Critical Care Medicine [facility] [place]   PULMONARY CRITICAL CARE SERVICE  PROGRESS NOTE  Date of Service: 03/11/2020  Robert Carpenter  [id number]:[id number]  DOB: 12-Sep-1933   DOA: 03/09/2020  Referring Physician: Carron Curie, MD  HPI: Robert Carpenter is a 87 y.o. male seen for follow up of Acute on Chronic Respiratory Failure.  Patient currently is on T collar 28% FiO2 should be able to change over to a cuffless trach need to be careful that he does not pull out his tracheostomy as he has done in the past  Medications: Reviewed on Rounds  Physical Exam:  Vitals: Temperature is 96.5 pulse 69 respiratory rate 29 blood pressure is 135/69 saturations 94%  Ventilator Settings on T collar FiO2 28%  . General: Comfortable at this time . Eyes: Grossly normal lids, irises & conjunctiva . ENT: grossly tongue is normal . Neck: no obvious mass . Cardiovascular: S1 S2 normal no gallop . Respiratory: No rhonchi very coarse percent . Abdomen: soft . Skin: no rash seen on limited exam . Musculoskeletal: not rigid . Psychiatric:unable to assess . Neurologic: no seizure no involuntary movements         Lab Data:   Basic Metabolic Panel: Recent Labs  Lab 03/10/20 0409  NA 136  K 3.9  CL 101  CO2 29  GLUCOSE 109*  BUN 13  CREATININE 0.88  CALCIUM 9.4  MG 2.0  PHOS 3.1    ABG: No results for input(s): PHART, PCO2ART, PO2ART, HCO3, O2SAT in the last 168 hours.  Liver Function Tests: Recent Labs  Lab 03/10/20 0409  AST 26  ALT 29  ALKPHOS 59  BILITOT 0.7  PROT 7.7  ALBUMIN 2.5*   No results for input(s): LIPASE, AMYLASE in the last 168 hours. No results for input(s): AMMONIA in the last 168 hours.  CBC: Recent Labs  Lab 03/10/20 0409  WBC 7.1  NEUTROABS 3.2  HGB 12.5*  HCT 36.7*  MCV 93.9  PLT 366    Cardiac Enzymes: No results for input(s): CKTOTAL, CKMB, CKMBINDEX, TROPONINI in the last 168 hours.  BNP (last 3 results) No results  for input(s): BNP in the last 8760 hours.  ProBNP (last 3 results) No results for input(s): PROBNP in the last 8760 hours.  Radiological Exams: DG CHEST PORT 1 VIEW  Result Date: 03/10/2020 CLINICAL DATA:  Respiratory failure. EXAM: PORTABLE CHEST 1 VIEW COMPARISON:  Chest x-ray 03/03/2020. FINDINGS: Interim removal of left PICC line. Tracheostomy tube in stable position. Stable cardiomegaly. Multifocal bilateral pulmonary infiltrates again noted. Interim slight improvement on the right. Stable elevation left hemidiaphragm. No pleural effusion or pneumothorax. Stable cardiomegaly. IMPRESSION: 1. Interim removal of left PICC line. Tracheostomy tube in stable position. 2. Multifocal bilateral pulmonary infiltrates again noted. Interim slight improvement on the right. Electronically Signed   By: Maisie Fus  Register   On: 03/10/2020 05:47   DG Abd Portable 1V  Result Date: 03/09/2020 CLINICAL DATA:  PEG status.  Respiratory failure. EXAM: PORTABLE ABDOMEN - 1 VIEW COMPARISON:  Abdominal CT 01/27/2020.  Chest radiographs 03/03/2020. FINDINGS: 1427 hours. The balloon of a percutaneous feeding tube projects over the left upper quadrant of the abdomen. No visible enteric contrast to assess the specific location of this to. The bowel gas pattern is nonobstructive. Contrast is present within scattered colonic diverticula. Bibasilar airspace opacities and diffuse lumbar spondylosis are noted. IMPRESSION: The balloon of a percutaneous feeding tube projects over the left upper quadrant of the abdomen. More specific  localization of the tube could be performed by repeating a radiograph after instilling contrast through it. Electronically Signed   By: Carey Bullocks M.D.   On: 03/09/2020 15:04    Assessment/Plan Active Problems:   Acute on chronic respiratory failure with hypoxia (HCC)   Chronic atrial fibrillation (HCC)   Cardiac arrest (HCC)   COVID-19 virus infection   1. Acute on chronic respiratory failure  with hypoxia patient will proceed to capping after changing out to a cuffless trach 2. Chronic atrial fibrillation rate is controlled 3. Cardiac arrest rhythm is stable 4. COVID-19 virus infection we will continue with supportive care   I have personally seen and evaluated the patient, evaluated laboratory and imaging results, formulated the assessment and plan and placed orders. The Patient requires high complexity decision making with multiple systems involvement.  Rounds were done with the Respiratory Therapy Director and Staff therapists and discussed with nursing staff also.  Yevonne Pax, MD [facility] Pulmonary Critical Care Medicine Sleep Medicine

## 2020-03-12 ENCOUNTER — Other Ambulatory Visit (HOSPITAL_COMMUNITY): Payer: Medicare Other

## 2020-03-12 DIAGNOSIS — J9621 Acute and chronic respiratory failure with hypoxia: Secondary | ICD-10-CM | POA: Diagnosis not present

## 2020-03-12 DIAGNOSIS — U071 COVID-19: Secondary | ICD-10-CM | POA: Diagnosis not present

## 2020-03-12 DIAGNOSIS — I482 Chronic atrial fibrillation, unspecified: Secondary | ICD-10-CM | POA: Diagnosis not present

## 2020-03-12 DIAGNOSIS — I469 Cardiac arrest, cause unspecified: Secondary | ICD-10-CM | POA: Diagnosis not present

## 2020-03-12 NOTE — Progress Notes (Signed)
Pulmonary Critical Care Medicine [facility] [place]   PULMONARY CRITICAL CARE SERVICE  PROGRESS NOTE  Date of Service: 03/12/2020  Robert Carpenter  OXB:[id number]  DOB: 02-Mar-1933   DOA: 03/09/2020  Referring Physician: Carron Curie, MD  HPI: Robert Carpenter is a 87 y.o. male seen for follow up of Acute on Chronic Respiratory Failure.  Patient is comfortable right now remains on T collar on 28% FiO2  Medications: Reviewed on Rounds  Physical Exam:  Vitals: Temperature is 97.6 pulse 70 respiratory blood pressure is reevaluated O2 saturation is 98  Ventilator Settings on T collar with an FiO2 of 28%  . General: Comfortable at this time . Eyes: Grossly normal lids, irises & conjunctiva . ENT: grossly tongue is normal . Neck: no obvious mass . Cardiovascular: S1 S2 normal no gallop . Respiratory: No rhonchi very coarse breath sounds are noted at this time . Abdomen: soft . Skin: no rash seen on limited exam . Musculoskeletal: not rigid . Psychiatric:unable to assess . Neurologic: no seizure no involuntary movements         Lab Data:   Basic Metabolic Panel: Recent Labs  Lab 03/10/20 0409  NA 136  K 3.9  CL 101  CO2 29  GLUCOSE 109*  BUN 13  CREATININE 0.88  CALCIUM 9.4  MG 2.0  PHOS 3.1    ABG: No results for input(s): PHART, PCO2ART, PO2ART, HCO3, O2SAT in the last 168 hours.  Liver Function Tests: Recent Labs  Lab 03/10/20 0409  AST 26  ALT 29  ALKPHOS 59  BILITOT 0.7  PROT 7.7  ALBUMIN 2.5*   No results for input(s): LIPASE, AMYLASE in the last 168 hours. No results for input(s): AMMONIA in the last 168 hours.  CBC: Recent Labs  Lab 03/10/20 0409  WBC 7.1  NEUTROABS 3.2  HGB 12.5*  HCT 36.7*  MCV 93.9  PLT 366    Cardiac Enzymes: No results for input(s): CKTOTAL, CKMB, CKMBINDEX, TROPONINI in the last 168 hours.  BNP (last 3 results) No results for input(s): BNP in the last 8760 hours.  ProBNP (last 3 results) No  results for input(s): PROBNP in the last 8760 hours.  Radiological Exams: DG ABDOMEN PEG TUBE LOCATION  Result Date: 03/12/2020 CLINICAL DATA:  Gastrostomy placement EXAM: ABDOMEN - 1 VIEW COMPARISON:  March 09, 2020 FINDINGS: Approximately 50 mL Omnipaque 300 contrast was administered into a gastrostomy catheter. Contrast flows from the catheter in the stomach. Contrast flows from the stomach into the duodenum. No contrast extravasation. There is no appreciable bowel dilatation or air-fluid level to suggest bowel obstruction. No free air noted. Moderate stool in colon. 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 IMPRESSION: Gastrostomy catheter positioned in stomach. Contrast flows from stomach into duodenum. No contrast extravasation. No bowel obstruction or free air evident on supine examination. Advanced arthropathy left hip joint. Electronically Signed   By: Bretta Bang III M.D.   On: 03/12/2020 09:52    Assessment/Plan Active Problems:   Acute on chronic respiratory failure with hypoxia (HCC)   Chronic atrial fibrillation (HCC)   Cardiac arrest (HCC)   COVID-19 virus infection   1. Acute on chronic respiratory failure "we will continue with T collar we will continue with secretion management supportive care. 2. Chronic atrial fibrillation rate is controlled 3. Cardiac arrest rhythm is stable 4. COVID-19 virus infection in recovery phase   I have personally seen and evaluated the patient, evaluated laboratory and imaging results,  formulated the assessment and plan and placed orders. The Patient requires high complexity decision making with multiple systems involvement.  Rounds were done with the Respiratory Therapy Director and Staff therapists and discussed with nursing staff also.  Allyne Gee, MD [facility] Pulmonary Critical Care Medicine Sleep Medicine

## 2020-03-14 DIAGNOSIS — U071 COVID-19: Secondary | ICD-10-CM | POA: Diagnosis not present

## 2020-03-14 DIAGNOSIS — I482 Chronic atrial fibrillation, unspecified: Secondary | ICD-10-CM | POA: Diagnosis not present

## 2020-03-14 DIAGNOSIS — I469 Cardiac arrest, cause unspecified: Secondary | ICD-10-CM | POA: Diagnosis not present

## 2020-03-14 DIAGNOSIS — J9621 Acute and chronic respiratory failure with hypoxia: Secondary | ICD-10-CM | POA: Diagnosis not present

## 2020-03-14 NOTE — Progress Notes (Signed)
Pulmonary Critical Care Medicine [facility] [place]   PULMONARY CRITICAL CARE SERVICE  PROGRESS NOTE  Date of Service: 03/14/2020  Robert Carpenter  IZT:[id number]  DOB: December 08, 1933   DOA: 03/09/2020  Referring Physician: Carron Curie, MD  HPI: Robert Carpenter is a 87 y.o. male seen for follow up of Acute on Chronic Respiratory Failure.  Remains off the ventilator on room air on T collar using PMV  Medications: Reviewed on Rounds  Physical Exam:  Vitals: Temperature 96.9 pulse 70 respiratory 24 blood pressure is 140/85 saturations 98%  Ventilator Settings off the vent T collar PMV  . General: Comfortable at this time . Eyes: Grossly normal lids, irises & conjunctiva . ENT: grossly tongue is normal . Neck: no obvious mass . Cardiovascular: S1 S2 normal no gallop . Respiratory: No rhonchi no rales . Abdomen: soft . Skin: no rash seen on limited exam . Musculoskeletal: not rigid . Psychiatric:unable to assess . Neurologic: no seizure no involuntary movements         Lab Data:   Basic Metabolic Panel: Recent Labs  Lab 03/10/20 0409  NA 136  K 3.9  CL 101  CO2 29  GLUCOSE 109*  BUN 13  CREATININE 0.88  CALCIUM 9.4  MG 2.0  PHOS 3.1    ABG: No results for input(s): PHART, PCO2ART, PO2ART, HCO3, O2SAT in the last 168 hours.  Liver Function Tests: Recent Labs  Lab 03/10/20 0409  AST 26  ALT 29  ALKPHOS 59  BILITOT 0.7  PROT 7.7  ALBUMIN 2.5*   No results for input(s): LIPASE, AMYLASE in the last 168 hours. No results for input(s): AMMONIA in the last 168 hours.  CBC: Recent Labs  Lab 03/10/20 0409  WBC 7.1  NEUTROABS 3.2  HGB 12.5*  HCT 36.7*  MCV 93.9  PLT 366    Cardiac Enzymes: No results for input(s): CKTOTAL, CKMB, CKMBINDEX, TROPONINI in the last 168 hours.  BNP (last 3 results) No results for input(s): BNP in the last 8760 hours.  ProBNP (last 3 results) No results for input(s): PROBNP in the last 8760  hours.  Radiological Exams: No results found.  Assessment/Plan Active Problems:   Acute on chronic respiratory failure with hypoxia (HCC)   Chronic atrial fibrillation (HCC)   Cardiac arrest (HCC)   COVID-19 virus infection   1. Acute on chronic respiratory failure hypoxia plan is to continue with the wean and proceed to capping trials 2. Chronic atrial fibrillation rate is controlled 3. Cardiac arrest rhythm stable 4. COVID-19 virus infection no change we will continue to monitor   I have personally seen and evaluated the patient, evaluated laboratory and imaging results, formulated the assessment and plan and placed orders. The Patient requires high complexity decision making with multiple systems involvement.  Rounds were done with the Respiratory Therapy Director and Staff therapists and discussed with nursing staff also.  Yevonne Pax, MD [facility] Pulmonary Critical Care Medicine Sleep Medicine

## 2020-03-15 DIAGNOSIS — I482 Chronic atrial fibrillation, unspecified: Secondary | ICD-10-CM | POA: Diagnosis not present

## 2020-03-15 DIAGNOSIS — I469 Cardiac arrest, cause unspecified: Secondary | ICD-10-CM | POA: Diagnosis not present

## 2020-03-15 DIAGNOSIS — U071 COVID-19: Secondary | ICD-10-CM | POA: Diagnosis not present

## 2020-03-15 DIAGNOSIS — J9621 Acute and chronic respiratory failure with hypoxia: Secondary | ICD-10-CM | POA: Diagnosis not present

## 2020-03-15 LAB — CBC
HCT: 36.8 % — ABNORMAL LOW (ref 39.0–52.0)
Hemoglobin: 12.5 g/dL — ABNORMAL LOW (ref 13.0–17.0)
MCH: 32 pg (ref 26.0–34.0)
MCHC: 34 g/dL (ref 30.0–36.0)
MCV: 94.1 fL (ref 80.0–100.0)
Platelets: 385 10*3/uL (ref 150–400)
RBC: 3.91 MIL/uL — ABNORMAL LOW (ref 4.22–5.81)
RDW: 13.7 % (ref 11.5–15.5)
WBC: 8 10*3/uL (ref 4.0–10.5)
nRBC: 0 % (ref 0.0–0.2)

## 2020-03-15 LAB — BASIC METABOLIC PANEL
Anion gap: 8 (ref 5–15)
BUN: 18 mg/dL (ref 8–23)
CO2: 32 mmol/L (ref 22–32)
Calcium: 9.6 mg/dL (ref 8.9–10.3)
Chloride: 99 mmol/L (ref 98–111)
Creatinine, Ser: 0.88 mg/dL (ref 0.61–1.24)
GFR, Estimated: >60 mL/min (ref >60)
Glucose, Bld: 93 mg/dL (ref 70–99)
Potassium: 3.8 mmol/L (ref 3.5–5.1)
Sodium: 139 mmol/L (ref 135–145)

## 2020-03-15 LAB — PHOSPHORUS: Phosphorus: 2.8 mg/dL (ref 2.5–4.6)

## 2020-03-15 LAB — MAGNESIUM: Magnesium: 2 mg/dL (ref 1.7–2.4)

## 2020-03-15 NOTE — Progress Notes (Signed)
Pulmonary Critical Care Medicine [facility] [place]   PULMONARY CRITICAL CARE SERVICE  PROGRESS NOTE  Date of Service: 03/15/2020  Robert Carpenter  [id number]  DOB: 1933/09/04   DOA: 03/09/2020  Referring Physician: Carron Curie, MD  HPI: Robert Carpenter is a 87 y.o. male seen for follow up of Acute on Chronic Respiratory Failure.  Patient is capping trials 2 L oxygen will be completing 24 hours today  Medications: Reviewed on Rounds  Physical Exam:  Vitals: Temperature is 97.2 pulse 72 respiratory 19 blood pressure 150/72 saturations 100%  Ventilator Settings capping of the ventilator   General: Comfortable at this time  Eyes: Grossly normal lids, irises & conjunctiva  ENT: grossly tongue is normal  Neck: no obvious mass  Cardiovascular: S1 S2 normal no gallop  Respiratory: No rhonchi very coarse breath sounds  Abdomen: soft  Skin: no rash seen on limited exam  Musculoskeletal: not rigid  Psychiatric:unable to assess  Neurologic: no seizure no involuntary movements         Lab Data:   Basic Metabolic Panel: Recent Labs  Lab 03/10/20 0409 03/15/20 0423  NA 136 139  K 3.9 3.8  CL 101 99  CO2 29 32  GLUCOSE 109* 93  BUN 13 18  CREATININE 0.88 0.88  CALCIUM 9.4 9.6  MG 2.0 2.0  PHOS 3.1 2.8    ABG: No results for input(s): PHART, PCO2ART, PO2ART, HCO3, O2SAT in the last 168 hours.  Liver Function Tests: Recent Labs  Lab 03/10/20 0409  AST 26  ALT 29  ALKPHOS 59  BILITOT 0.7  PROT 7.7  ALBUMIN 2.5*   No results for input(s): LIPASE, AMYLASE in the last 168 hours. No results for input(s): AMMONIA in the last 168 hours.  CBC: Recent Labs  Lab 03/10/20 0409 03/15/20 0423  WBC 7.1 8.0  NEUTROABS 3.2  --   HGB 12.5* 12.5*  HCT 36.7* 36.8*  MCV 93.9 94.1  PLT 366 385    Cardiac Enzymes: No results for input(s): CKTOTAL, CKMB, CKMBINDEX, TROPONINI in the last 168 hours.  BNP (last 3 results) No results for  input(s): BNP in the last 8760 hours.  ProBNP (last 3 results) No results for input(s): PROBNP in the last 8760 hours.  Radiological Exams: No results found.  Assessment/Plan Active Problems:   Acute on chronic respiratory failure with hypoxia (HCC)   Chronic atrial fibrillation (HCC)   Cardiac arrest (HCC)   COVID-19 virus infection   1. Acute on chronic respiratory failure hypoxia plan is to continue with capping trials titrate as tolerated 2. Chronic atrial fibrillation rate is controlled we will continue to follow along. 3. Cardiac arrest rhythm is stable we will continue with supportive care 4. COVID-19 virus infection recovery   I have personally seen and evaluated the patient, evaluated laboratory and imaging results, formulated the assessment and plan and placed orders. The Patient requires high complexity decision making with multiple systems involvement.  Rounds were done with the Respiratory Therapy Director and Staff therapists and discussed with nursing staff also.  Yevonne Pax, MD [facility] Pulmonary Critical Care Medicine Sleep Medicine

## 2020-03-16 ENCOUNTER — Other Ambulatory Visit (HOSPITAL_COMMUNITY): Payer: Medicare Other

## 2020-03-16 DIAGNOSIS — I482 Chronic atrial fibrillation, unspecified: Secondary | ICD-10-CM | POA: Diagnosis not present

## 2020-03-16 DIAGNOSIS — I469 Cardiac arrest, cause unspecified: Secondary | ICD-10-CM | POA: Diagnosis not present

## 2020-03-16 DIAGNOSIS — U071 COVID-19: Secondary | ICD-10-CM | POA: Diagnosis not present

## 2020-03-16 DIAGNOSIS — J9621 Acute and chronic respiratory failure with hypoxia: Secondary | ICD-10-CM | POA: Diagnosis not present

## 2020-03-16 HISTORY — PX: IR REPLC GASTRO/COLONIC TUBE PERCUT W/FLUORO: IMG2333

## 2020-03-16 MED ORDER — IOHEXOL 300 MG/ML  SOLN
50.0000 mL | Freq: Once | INTRAMUSCULAR | Status: AC | PRN
  Administered 2020-03-16: 15 mL

## 2020-03-16 MED ORDER — LIDOCAINE VISCOUS HCL 2 % MT SOLN
OROMUCOSAL | Status: AC
  Filled 2020-03-16: qty 15

## 2020-03-16 NOTE — Consult Note (Signed)
Chief Complaint: Gastrostomy tube fell out. Request is for Gastrostomy tube replacement.   Referring Physician(s): C. Dierdre Searles NP  Supervising Physician: Irish Lack  Patient Status: [facility] - In-pt ([facility])  History of Present Illness: Robert Carpenter is a 87 y.o. male inpatient ([facility]). History acute on chronic respiratory failure s/p trach placement on 1.24.22, a fib, COVID, bilateral DVT ( on eliquis) dysphagia s/p gastrostomy tube placement in February 2022. Patient pulled out the gastrostomy tube overnight.  Per [facility] Team was unable to  Place a foley catheter. Team is requesting a gastrostomy tube replacement. Size unknown.  Past Medical History:  Diagnosis Date  . Acute on chronic respiratory failure with hypoxia (HCC)   . Cardiac arrest (HCC)   . Chronic atrial fibrillation (HCC)   . COVID-19 virus infection   . Hypertension     Past Surgical History:  Procedure Laterality Date  . HIP SURGERY      Allergies: Patient has no known allergies.  Medications: Prior to Admission medications   Medication Sig Start Date End Date Taking? Authorizing Provider  ibuprofen (ADVIL) 800 MG tablet Take 1 tablet (800 mg total) by mouth every 8 (eight) hours as needed. 12/15/19   Bethann Berkshire, MD     No family history on file.  Social History   Socioeconomic History  . Marital status: Married    Spouse name: Not on file  . Number of children: Not on file  . Years of education: Not on file  . Highest education level: Not on file  Occupational History  . Not on file  Tobacco Use  . Smoking status: Never Smoker  . Smokeless tobacco: Never Used  Substance and Sexual Activity  . Alcohol use: Never  . Drug use: Never  . Sexual activity: Not on file  Other Topics Concern  . Not on file  Social History Narrative  . Not on file   Social Determinants of Health   Financial Resource Strain: Not on file  Food Insecurity: Not on file  Transportation Needs: Not on file   Physical Activity: Not on file  Stress: Not on file  Social Connections: Not on file     Imaging: DG ABDOMEN PEG TUBE LOCATION  Result Date: 03/12/2020 CLINICAL DATA:  Gastrostomy placement EXAM: ABDOMEN - 1 VIEW COMPARISON:  March 09, 2020 FINDINGS: Approximately 50 mL Omnipaque 300 contrast was administered into a gastrostomy catheter. Contrast flows from the catheter in the stomach. Contrast flows from the stomach into the duodenum. No contrast extravasation. There is no appreciable bowel dilatation or air-fluid level to suggest bowel obstruction. No free air noted. Moderate stool in colon. 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 IMPRESSION: Gastrostomy catheter positioned in stomach. Contrast flows from stomach into duodenum. No contrast extravasation. No bowel obstruction or free air evident on supine examination. Advanced arthropathy left hip joint. Electronically Signed   By: Bretta Bang III M.D.   On: 03/12/2020 09:52   DG CHEST PORT 1 VIEW  Result Date: 03/10/2020 CLINICAL DATA:  Respiratory failure. EXAM: PORTABLE CHEST 1 VIEW COMPARISON:  Chest x-ray 03/03/2020. FINDINGS: Interim removal of left PICC line. Tracheostomy tube in stable position. Stable cardiomegaly. Multifocal bilateral pulmonary infiltrates again noted. Interim slight improvement on the right. Stable elevation left hemidiaphragm. No pleural effusion or pneumothorax. Stable cardiomegaly. IMPRESSION: 1. Interim removal of left PICC line. Tracheostomy tube in stable position. 2. Multifocal bilateral pulmonary infiltrates again noted. Interim slight improvement on the right. Electronically Signed  ByMaisie Fus  Register   On: 03/10/2020 05:47   DG Abd Portable 1V  Result Date: 03/09/2020 CLINICAL DATA:  PEG status.  Respiratory failure. EXAM: PORTABLE ABDOMEN - 1 VIEW COMPARISON:  Abdominal CT 01/27/2020.  Chest radiographs 03/03/2020. FINDINGS: 1427 hours. The balloon of a percutaneous feeding tube  projects over the left upper quadrant of the abdomen. No visible enteric contrast to assess the specific location of this to. The bowel gas pattern is nonobstructive. Contrast is present within scattered colonic diverticula. Bibasilar airspace opacities and diffuse lumbar spondylosis are noted. IMPRESSION: The balloon of a percutaneous feeding tube projects over the left upper quadrant of the abdomen. More specific localization of the tube could be performed by repeating a radiograph after instilling contrast through it. Electronically Signed   By: Carey Bullocks M.D.   On: 03/09/2020 15:04    Labs:  CBC: Recent Labs    12/14/19 1754 03/10/20 0409 03/15/20 0423  WBC 4.1 7.1 8.0  HGB 14.4 12.5* 12.5*  HCT 41.7 36.7* 36.8*  PLT 146* 366 385    COAGS: Recent Labs    03/10/20 0409  INR 1.4*    BMP: Recent Labs    12/14/19 1754 03/10/20 0409 03/15/20 0423  NA 132* 136 139  K 4.0 3.9 3.8  CL 100 101 99  CO2 22 29 32  GLUCOSE 242* 109* 93  BUN 33* 13 18  CALCIUM 8.9 9.4 9.6  CREATININE 1.98* 0.88 0.88  GFRNONAA 33* >60 >60    LIVER FUNCTION TESTS: Recent Labs    12/14/19 1754 03/10/20 0409  BILITOT 0.7 0.7  AST 66* 26  ALT 41 29  ALKPHOS 46 59  PROT 7.5 7.7  ALBUMIN 3.7 2.5*    Assessment and Plan:  87 y.o. male inpatient ([facility]). History acute on chronic respiratory failure s/p trach placement on 1.24.22, a fib, COVID, bilateral DVT ( on eliquis) dysphagia s/p gastrostomy tube placement in February 2022. Patient pulled out the gastrostomy tube overnight.  Per [facility] Team was unable to  Place a foley catheter. Team is requesting a gastrostomy tube replacement. Size unknown.  Abd from 3.12.22 shows G tube in good position. Patient is on eliquis. All labs are within acceptable parameters.   Risks and benefits image guided gastrostomy tube placement was discussed with the patient including, but not limited to the need for a barium enema during the procedure,  bleeding, infection, peritonitis and/or damage to adjacent structures.  All of the patient's son's  questions were answered, patient is agreeable to proceed.  Consent signed and in chart.   Thank you for this interesting consult.  I greatly enjoyed meeting Robert Carpenter and look forward to participating in their care.  A copy of this report was sent to the requesting provider on this date.  Electronically Signed: Alene Mires, NP 03/16/2020, 8:45 AM   I spent a total of 20 Minutes    in face to face in clinical consultation, greater than 50% of which was counseling/coordinating care for gastrostomy tube replacement.

## 2020-03-16 NOTE — Procedures (Signed)
Interventional Radiology Procedure Note  Procedure: Replacement of gastrostomy tube  Complications: None  Estimated Blood Loss: None  Findings: Small tract catheterized with 5Fr catheter. Tract dilated to 16 Fr over wire. New 14 Fr balloon retention gastrostomy tube placed with tip in body of stomach. OK to use.  Jodi Marble. Fredia Sorrow, M.D Pager:  [phone]

## 2020-03-16 NOTE — Progress Notes (Signed)
Pulmonary Critical Care Medicine [facility] [place]   PULMONARY CRITICAL CARE SERVICE  PROGRESS NOTE  Date of Service: 03/16/2020  Robert Carpenter  ZOX:[id number]  DOB: [birth year]   DOA: 03/09/2020  Referring Physician: Carron Curie, MD  HPI: Robert Carpenter is a 87 y.o. male seen for follow up of Acute on Chronic Respiratory Failure.  Patient is capping right now and will be completing 48 hours  Medications: Reviewed on Rounds  Physical Exam:  Vitals: Temperature is 97 pulse 77 respiratory blood pressure 95/50 saturations 93%  Ventilator Settings capping of the ventilator  . General: Comfortable at this time . Eyes: Grossly normal lids, irises & conjunctiva . ENT: grossly tongue is normal . Neck: no obvious mass . Cardiovascular: S1 S2 normal no gallop . Respiratory: No rhonchi rales are noted at this time . Abdomen: soft . Skin: no rash seen on limited exam . Musculoskeletal: not rigid . Psychiatric:unable to assess . Neurologic: no seizure no involuntary movements         Lab Data:   Basic Metabolic Panel: Recent Labs  Lab 03/10/20 0409 03/15/20 0423  NA 136 139  K 3.9 3.8  CL 101 99  CO2 29 32  GLUCOSE 109* 93  BUN 13 18  CREATININE 0.88 0.88  CALCIUM 9.4 9.6  MG 2.0 2.0  PHOS 3.1 2.8    ABG: No results for input(s): PHART, PCO2ART, PO2ART, HCO3, O2SAT in the last 168 hours.  Liver Function Tests: Recent Labs  Lab 03/10/20 0409  AST 26  ALT 29  ALKPHOS 59  BILITOT 0.7  PROT 7.7  ALBUMIN 2.5*   No results for input(s): LIPASE, AMYLASE in the last 168 hours. No results for input(s): AMMONIA in the last 168 hours.  CBC: Recent Labs  Lab 03/10/20 0409 03/15/20 0423  WBC 7.1 8.0  NEUTROABS 3.2  --   HGB 12.5* 12.5*  HCT 36.7* 36.8*  MCV 93.9 94.1  PLT 366 385    Cardiac Enzymes: No results for input(s): CKTOTAL, CKMB, CKMBINDEX, TROPONINI in the last 168 hours.  BNP (last 3 results) No results for input(s): BNP in the  last 8760 hours.  ProBNP (last 3 results) No results for input(s): PROBNP in the last 8760 hours.  Radiological Exams: No results found.  Assessment/Plan Active Problems:   Acute on chronic respiratory failure with hypoxia (HCC)   Chronic atrial fibrillation (HCC)   Cardiac arrest (HCC)   COVID-19 virus infection   1. "Acute on chronic respiratory failure with hypoxia history of hypoxia patient is doing well with capping trials which will be continued.  Completing 48 hours 2. Chronic atrial fibrillation rate is controlled we will continue to monitor 3. Cardiac arrest rhythm stable at this time 4. COVID-19 virus infection recovery   I have personally seen and evaluated the patient, evaluated laboratory and imaging results, formulated the assessment and plan and placed orders. The Patient requires high complexity decision making with multiple systems involvement.  Rounds were done with the Respiratory Therapy Director and Staff therapists and discussed with nursing staff also.  Yevonne Pax, MD [facility] Pulmonary Critical Care Medicine Sleep Medicine

## 2020-03-17 DIAGNOSIS — J9621 Acute and chronic respiratory failure with hypoxia: Secondary | ICD-10-CM | POA: Diagnosis not present

## 2020-03-17 DIAGNOSIS — I482 Chronic atrial fibrillation, unspecified: Secondary | ICD-10-CM | POA: Diagnosis not present

## 2020-03-17 DIAGNOSIS — I469 Cardiac arrest, cause unspecified: Secondary | ICD-10-CM | POA: Diagnosis not present

## 2020-03-17 DIAGNOSIS — U071 COVID-19: Secondary | ICD-10-CM | POA: Diagnosis not present

## 2020-03-17 NOTE — Progress Notes (Signed)
Pulmonary Critical Care Medicine [facility] [place]   PULMONARY CRITICAL CARE SERVICE  PROGRESS NOTE  Date of Service: 03/17/2020  Robert Carpenter  UYQ:[id number]  DOB: [birth year]   DOA: 03/09/2020  Referring Physician: Carron Curie, MD  HPI: Robert Carpenter is a 87 y.o. male seen for follow up of Acute on Chronic Respiratory Failure.  The patient is afebrile capping on room air doing very well ready for decannulation  Medications: Reviewed on Rounds  Physical Exam:  Vitals: Temperature is 96.9 pulse 62 respiratory rate is 20 blood pressure is 118/74  Ventilator Settings capping off the ventilator  . General: Comfortable at this time . Eyes: Grossly normal lids, irises & conjunctiva . ENT: grossly tongue is normal . Neck: no obvious mass . Cardiovascular: S1 S2 normal no gallop . Respiratory: No rhonchi no rales are noted at this time . Abdomen: soft . Skin: no rash seen on limited exam . Musculoskeletal: not rigid . Psychiatric:unable to assess . Neurologic: no seizure no involuntary movements         Lab Data:   Basic Metabolic Panel: Recent Labs  Lab 03/15/20 0423  NA 139  K 3.8  CL 99  CO2 32  GLUCOSE 93  BUN 18  CREATININE 0.88  CALCIUM 9.6  MG 2.0  PHOS 2.8    ABG: No results for input(s): PHART, PCO2ART, PO2ART, HCO3, O2SAT in the last 168 hours.  Liver Function Tests: No results for input(s): AST, ALT, ALKPHOS, BILITOT, PROT, ALBUMIN in the last 168 hours. No results for input(s): LIPASE, AMYLASE in the last 168 hours. No results for input(s): AMMONIA in the last 168 hours.  CBC: Recent Labs  Lab 03/15/20 0423  WBC 8.0  HGB 12.5*  HCT 36.8*  MCV 94.1  PLT 385    Cardiac Enzymes: No results for input(s): CKTOTAL, CKMB, CKMBINDEX, TROPONINI in the last 168 hours.  BNP (last 3 results) No results for input(s): BNP in the last 8760 hours.  ProBNP (last 3 results) No results for input(s): PROBNP in the last 8760  hours.  Radiological Exams: IR Replc Gastro/Colonic Tube Percut W/Fluoro  Result Date: 03/16/2020 INDICATION: Indwelling percutaneous gastrostomy tube has become dislodged. EXAM: REPLACEMENT OF PERCUTANEOUS GASTROSTOMY TUBE UNDER FLUOROSCOPY MEDICATIONS: None ANESTHESIA/SEDATION: None CONTRAST:  15 mL Omnipaque 300-administered into the gastric lumen. FLUOROSCOPY TIME:  Fluoroscopy Time: 6 seconds.  1.0 mGy. COMPLICATIONS: None immediate. PROCEDURE: The pre-existing gastrostomy tube tract was probed with a 5 French catheter. Contrast was injected via the catheter under fluoroscopy. A guidewire was advanced into the gastric lumen. The catheter was removed. The percutaneous tract was dilated over the wire up to 16 [place]. A 14 French balloon retention gastrostomy tube was then advanced over the wire and into the stomach. The retention balloon was inflated with 7 mL of diluted contrast. The tube was injected with contrast and a fluoroscopic spot image obtained to confirm position. FINDINGS: A small percutaneous tract was able to be catheterized with a 5 French catheter advanced into the lumen of the stomach. Tract dilatation was performed in order to re-advance a balloon retention gastrostomy tube. A 14 French balloon retention gastrostomy tube was placed with the tip located in the body of the stomach. IMPRESSION: Replacement of dislodged percutaneous gastrostomy tube under fluoroscopy after catheterization of the gastrostomy tract and tract dilatation. A 14 French balloon retention gastrostomy tube was placed with the tip located in the body of the stomach. Electronically Signed   By: Irish Lack  M.D.   On: 03/16/2020 16:55    Assessment/Plan Active Problems:   Acute on chronic respiratory failure with hypoxia (HCC)   Chronic atrial fibrillation (HCC)   Cardiac arrest (HCC)   COVID-19 virus infection   1. Acute on chronic respiratory failure hypoxia patient is ready for decannulation will  proceed 2. Chronic atrial fibrillation rate controlled 3. Cardiac arrest rhythm stable 4. COVID-19 virus infection recovery we will continue to monitor   I have personally seen and evaluated the patient, evaluated laboratory and imaging results, formulated the assessment and plan and placed orders. The Patient requires high complexity decision making with multiple systems involvement.  Rounds were done with the Respiratory Therapy Director and Staff therapists and discussed with nursing staff also.  Yevonne Pax, MD [facility] Pulmonary Critical Care Medicine Sleep Medicine

## 2020-03-18 LAB — CBC
HCT: 37.1 % — ABNORMAL LOW (ref 39.0–52.0)
Hemoglobin: 12.2 g/dL — ABNORMAL LOW (ref 13.0–17.0)
MCH: 31.5 pg (ref 26.0–34.0)
MCHC: 32.9 g/dL (ref 30.0–36.0)
MCV: 95.9 fL (ref 80.0–100.0)
Platelets: 377 10*3/uL (ref 150–400)
RBC: 3.87 MIL/uL — ABNORMAL LOW (ref 4.22–5.81)
RDW: 13.8 % (ref 11.5–15.5)
WBC: 6.5 10*3/uL (ref 4.0–10.5)
nRBC: 0 % (ref 0.0–0.2)

## 2020-03-18 LAB — BASIC METABOLIC PANEL
Anion gap: 6 (ref 5–15)
BUN: 18 mg/dL (ref 8–23)
CO2: 35 mmol/L — ABNORMAL HIGH (ref 22–32)
Calcium: 9.3 mg/dL (ref 8.9–10.3)
Chloride: 99 mmol/L (ref 98–111)
Creatinine, Ser: 0.95 mg/dL (ref 0.61–1.24)
GFR, Estimated: >60 mL/min (ref >60)
Glucose, Bld: 120 mg/dL — ABNORMAL HIGH (ref 70–99)
Potassium: 3.4 mmol/L — ABNORMAL LOW (ref 3.5–5.1)
Sodium: 140 mmol/L (ref 135–145)

## 2020-03-18 LAB — PHOSPHORUS: Phosphorus: 2.6 mg/dL (ref 2.5–4.6)

## 2020-03-18 LAB — MAGNESIUM: Magnesium: 2 mg/dL (ref 1.7–2.4)

## 2020-03-19 LAB — POTASSIUM: Potassium: 3.5 mmol/L (ref 3.5–5.1)

## 2020-03-20 LAB — POTASSIUM: Potassium: 4 mmol/L (ref 3.5–5.1)

## 2020-03-22 LAB — BASIC METABOLIC PANEL
Anion gap: 8 (ref 5–15)
BUN: 22 mg/dL (ref 8–23)
CO2: 31 mmol/L (ref 22–32)
Calcium: 9.2 mg/dL (ref 8.9–10.3)
Chloride: 100 mmol/L (ref 98–111)
Creatinine, Ser: 0.89 mg/dL (ref 0.61–1.24)
GFR, Estimated: >60 mL/min (ref >60)
Glucose, Bld: 108 mg/dL — ABNORMAL HIGH (ref 70–99)
Potassium: 3.9 mmol/L (ref 3.5–5.1)
Sodium: 139 mmol/L (ref 135–145)

## 2020-03-23 LAB — BASIC METABOLIC PANEL
Anion gap: 5 (ref 5–15)
BUN: 18 mg/dL (ref 8–23)
CO2: 32 mmol/L (ref 22–32)
Calcium: 9 mg/dL (ref 8.9–10.3)
Chloride: 100 mmol/L (ref 98–111)
Creatinine, Ser: 0.9 mg/dL (ref 0.61–1.24)
GFR, Estimated: >60 mL/min (ref >60)
Glucose, Bld: 107 mg/dL — ABNORMAL HIGH (ref 70–99)
Potassium: 3.5 mmol/L (ref 3.5–5.1)
Sodium: 137 mmol/L (ref 135–145)

## 2020-03-23 LAB — MAGNESIUM: Magnesium: 2.1 mg/dL (ref 1.7–2.4)

## 2020-03-23 LAB — CBC
HCT: 34.5 % — ABNORMAL LOW (ref 39.0–52.0)
Hemoglobin: 11.5 g/dL — ABNORMAL LOW (ref 13.0–17.0)
MCH: 32.1 pg (ref 26.0–34.0)
MCHC: 33.3 g/dL (ref 30.0–36.0)
MCV: 96.4 fL (ref 80.0–100.0)
Platelets: 316 10*3/uL (ref 150–400)
RBC: 3.58 MIL/uL — ABNORMAL LOW (ref 4.22–5.81)
RDW: 14.6 % (ref 11.5–15.5)
WBC: 4.9 10*3/uL (ref 4.0–10.5)
nRBC: 0 % (ref 0.0–0.2)

## 2020-03-26 LAB — CBC
HCT: 41.3 % (ref 39.0–52.0)
Hemoglobin: 13.8 g/dL (ref 13.0–17.0)
MCH: 31.7 pg (ref 26.0–34.0)
MCHC: 33.4 g/dL (ref 30.0–36.0)
MCV: 94.7 fL (ref 80.0–100.0)
Platelets: 365 10*3/uL (ref 150–400)
RBC: 4.36 MIL/uL (ref 4.22–5.81)
RDW: 14.7 % (ref 11.5–15.5)
WBC: 7.7 10*3/uL (ref 4.0–10.5)
nRBC: 0 % (ref 0.0–0.2)

## 2020-03-26 LAB — BASIC METABOLIC PANEL
Anion gap: 11 (ref 5–15)
BUN: 23 mg/dL (ref 8–23)
CO2: 25 mmol/L (ref 22–32)
Calcium: 9.4 mg/dL (ref 8.9–10.3)
Chloride: 102 mmol/L (ref 98–111)
Creatinine, Ser: 0.94 mg/dL (ref 0.61–1.24)
GFR, Estimated: >60 mL/min (ref >60)
Glucose, Bld: 118 mg/dL — ABNORMAL HIGH (ref 70–99)
Potassium: 3.8 mmol/L (ref 3.5–5.1)
Sodium: 138 mmol/L (ref 135–145)

## 2020-03-26 LAB — URINALYSIS, ROUTINE W REFLEX MICROSCOPIC
Bilirubin Urine: NEGATIVE
Glucose, UA: NEGATIVE mg/dL
Hgb urine dipstick: NEGATIVE
Ketones, ur: NEGATIVE mg/dL
Nitrite: NEGATIVE
Protein, ur: NEGATIVE mg/dL
Specific Gravity, Urine: 1.02 (ref 1.005–1.030)
pH: 6 (ref 5.0–8.0)

## 2020-03-26 LAB — MAGNESIUM: Magnesium: 2.1 mg/dL (ref 1.7–2.4)

## 2020-03-28 ENCOUNTER — Other Ambulatory Visit (HOSPITAL_COMMUNITY): Payer: Medicare Other

## 2020-03-28 LAB — URINE CULTURE: Culture: >=100000 — AB

## 2020-03-29 ENCOUNTER — Other Ambulatory Visit (HOSPITAL_COMMUNITY): Payer: Medicare Other

## 2020-03-29 LAB — CBC
HCT: 39 % (ref 39.0–52.0)
Hemoglobin: 13 g/dL (ref 13.0–17.0)
MCH: 32.2 pg (ref 26.0–34.0)
MCHC: 33.3 g/dL (ref 30.0–36.0)
MCV: 96.5 fL (ref 80.0–100.0)
Platelets: 344 10*3/uL (ref 150–400)
RBC: 4.04 MIL/uL — ABNORMAL LOW (ref 4.22–5.81)
RDW: 14.9 % (ref 11.5–15.5)
WBC: 6.2 10*3/uL (ref 4.0–10.5)
nRBC: 0 % (ref 0.0–0.2)

## 2020-03-29 LAB — BASIC METABOLIC PANEL
Anion gap: 8 (ref 5–15)
BUN: 22 mg/dL (ref 8–23)
CO2: 31 mmol/L (ref 22–32)
Calcium: 9.2 mg/dL (ref 8.9–10.3)
Chloride: 103 mmol/L (ref 98–111)
Creatinine, Ser: 0.84 mg/dL (ref 0.61–1.24)
GFR, Estimated: >60 mL/min (ref >60)
Glucose, Bld: 100 mg/dL — ABNORMAL HIGH (ref 70–99)
Potassium: 3.4 mmol/L — ABNORMAL LOW (ref 3.5–5.1)
Sodium: 142 mmol/L (ref 135–145)

## 2020-03-29 LAB — PHOSPHORUS: Phosphorus: 3.7 mg/dL (ref 2.5–4.6)

## 2020-03-29 LAB — NOVEL CORONAVIRUS, NAA (HOSP ORDER, SEND-OUT TO REF LAB; TAT 18-24 HRS): SARS-CoV-2, NAA: NOT DETECTED

## 2020-03-29 LAB — MAGNESIUM: Magnesium: 2.1 mg/dL (ref 1.7–2.4)

## 2020-04-07 ENCOUNTER — Emergency Department (HOSPITAL_COMMUNITY): Payer: Medicare Other

## 2020-04-07 ENCOUNTER — Emergency Department (HOSPITAL_COMMUNITY)
Admission: EM | Admit: 2020-04-07 | Discharge: 2020-04-07 | Disposition: A | Discharge status: Home / Self Care | Payer: Medicare Other | Attending: Emergency Medicine | Admitting: Emergency Medicine

## 2020-04-07 ENCOUNTER — Encounter (HOSPITAL_COMMUNITY): Payer: Self-pay

## 2020-04-07 ENCOUNTER — Other Ambulatory Visit: Payer: Self-pay

## 2020-04-07 DIAGNOSIS — Z8616 Personal history of COVID-19: Secondary | ICD-10-CM | POA: Diagnosis not present

## 2020-04-07 DIAGNOSIS — F0390 Unspecified dementia without behavioral disturbance: Secondary | ICD-10-CM | POA: Insufficient documentation

## 2020-04-07 DIAGNOSIS — K9423 Gastrostomy malfunction: Secondary | ICD-10-CM | POA: Insufficient documentation

## 2020-04-07 DIAGNOSIS — I482 Chronic atrial fibrillation, unspecified: Secondary | ICD-10-CM | POA: Diagnosis not present

## 2020-04-07 DIAGNOSIS — R1310 Dysphagia, unspecified: Secondary | ICD-10-CM | POA: Diagnosis not present

## 2020-04-07 DIAGNOSIS — I10 Essential (primary) hypertension: Secondary | ICD-10-CM | POA: Insufficient documentation

## 2020-04-07 DIAGNOSIS — T85528A Displacement of other gastrointestinal prosthetic devices, implants and grafts, initial encounter: Secondary | ICD-10-CM

## 2020-04-07 HISTORY — PX: IR REPLC GASTRO/COLONIC TUBE PERCUT W/FLUORO: IMG2333

## 2020-04-07 MED ORDER — IOHEXOL 300 MG/ML  SOLN
50.0000 mL | Freq: Once | INTRAMUSCULAR | Status: AC | PRN
  Administered 2020-04-07: 15 mL

## 2020-04-07 MED ORDER — LIDOCAINE VISCOUS HCL 2 % MT SOLN
OROMUCOSAL | Status: AC
  Filled 2020-04-07: qty 15

## 2020-04-07 NOTE — ED Notes (Signed)
Pt is number 8 on the list for [facility]

## 2020-04-07 NOTE — ED Triage Notes (Signed)
Per EMS: Called out to facility, PT woke up and g-tube was out, does not remember how or when it happened. Sometime overnight. EMS does not know the size of tube.

## 2020-04-07 NOTE — Discharge Instructions (Signed)
A 14 French G-tube replacement have been performed.  It is ready for immediate use.

## 2020-04-07 NOTE — ED Provider Notes (Signed)
I personally evaluated the patient during the encounter and completed a history, physical, procedures, medical decision making to contribute to the overall care of the patient and decision making for the patient briefly, the patient is a 87 y.o. male here for G-tube that fell out.  Unable to pass a 33 [place] or 10 [place] Foley catheter.  Patient per our IR note had a 76 [place] G-tube placed 3 weeks ago.  Original G-tube was placed in mid February.  Trying to find a smaller [place] Foley catheter past but will have to talk to IR about replacing G-tube.  It appears a several weeks ago and this happened IR had to put in a 5 [place] Foley catheter and do multiple dilations until they were able to put in a 14 [place].  Supposedly feeding tube fell out overnight.  Patient is on continuous tube feeds.  Will attempt to try to get a smaller Foley catheter and but believe IR will need to use some fluoroscopy and over-the-wire catheter exchanges.  This chart was dictated using voice recognition software.  Despite best efforts to proofread,  errors can occur which can change the documentation meaning.     EKG Interpretation None           Virgina Norfolk, DO 04/07/20 5625

## 2020-04-07 NOTE — ED Provider Notes (Signed)
[facility] EMERGENCY DEPARTMENT Provider Note   CSN: [id number] Arrival date & time: 04/07/20  6962     History Chief Complaint  Patient presents with  . G-Tube Replacement    Robert Carpenter is a 87 y.o. male.  The history is provided by the EMS personnel, the patient, the nursing home and medical records. No language interpreter was used.     87 year old male significant history of atrial fibrillation, hypertension, brought here via EMS from facility due to dislodgment of G-tube.  Per EMS, pt woke up and notice that his Gtube was out but does not when or how it happened.  Pt is a poor historian with difficult speech to understand.  I have called pt's nursing unit and spoke with the nurse manager who report pt is on continuous tube feeding through his G-tube.  It was dislodged some time this AM.  Per note, G-tube was placed in February of this year.  Past Medical History:  Diagnosis Date  . Acute on chronic respiratory failure with hypoxia (HCC)   . Cardiac arrest (HCC)   . Chronic atrial fibrillation (HCC)   . COVID-19 virus infection   . Hypertension     Patient Active Problem List   Diagnosis Date Noted  . Acute on chronic respiratory failure with hypoxia (HCC)   . Chronic atrial fibrillation (HCC)   . Cardiac arrest (HCC)   . COVID-19 virus infection     Past Surgical History:  Procedure Laterality Date  . HIP SURGERY    . IR REPLC GASTRO/COLONIC TUBE PERCUT W/FLUORO  03/16/2020       History reviewed. No pertinent family history.  Social History   Tobacco Use  . Smoking status: Never Smoker  . Smokeless tobacco: Never Used  Substance Use Topics  . Alcohol use: Never  . Drug use: Never    Home Medications Prior to Admission medications   Medication Sig Start Date End Date Taking? Authorizing Provider  ibuprofen (ADVIL) 800 MG tablet Take 1 tablet (800 mg total) by mouth every 8 (eight) hours as needed. 12/15/19   Bethann Berkshire, MD     Allergies    Patient has no known allergies.  Review of Systems   Review of Systems  Unable to perform ROS: Dementia    Physical Exam Updated Vital Signs BP 129/75   Pulse 79   Temp 98.3 F (36.8 C) (Oral)   Resp 16   SpO2 98%   Physical Exam Vitals and nursing note reviewed.  Constitutional:      General: He is not in acute distress.    Appearance: He is well-developed.  HENT:     Head: Atraumatic.  Eyes:     Conjunctiva/sclera: Conjunctivae normal.  Cardiovascular:     Rate and Rhythm: Rhythm irregular.     Pulses: Normal pulses.     Heart sounds: Normal heart sounds.  Pulmonary:     Effort: Pulmonary effort is normal.     Breath sounds: Normal breath sounds.  Abdominal:     Palpations: Abdomen is soft.     Tenderness: There is no abdominal tenderness.     Comments: G-tube site noted without tube in place. It is nontender to palpation.   Musculoskeletal:     Cervical back: Neck supple.  Skin:    Findings: No rash.  Neurological:     Mental Status: He is alert.     ED Results / Procedures / Treatments   Labs (all labs ordered  are listed, but only abnormal results are displayed) Labs Reviewed - No data to display  EKG None  Radiology IR Replc Gastro/Colonic Tube Percut W/Fluoro  Result Date: 04/07/2020 INDICATION: Interval removal of percutaneous gastrostomy tube with inability to replace the gastrostomy tube at the patient's bedside. As such, patient presents now for fluoroscopic guided replacement pre-existing percutaneous gastrostomy tube. EXAM: FLUOROSCOPIC GUIDED REPLACEMENT OF GASTROSTOMY TUBE COMPARISON:  Fluoroscopic guided replacement of gastrostomy tube-03/16/2020 MEDICATIONS: None. CONTRAST:  82mL OMNIPAQUE IOHEXOL 300 MG/ML SOLN - administered into the gastric lumen FLUOROSCOPY TIME:  49 seconds COMPLICATIONS: None immediate. PROCEDURE: A timeout was performed prior to the initiation of the procedure. The track of the pre-existing gastrostomy  tube was cannulated with a Kumpe catheter was advanced through the subcutaneous tract to the level the gastric lumen. Contrast injection confirmed appropriate positioning Next, over a short Amplatz wire, the track was dilated ultimately allowing placement of a 14 French balloon retention gastrostomy tube. The balloon was inflated with saline and dilute contrast and pulled against the anterior inner lumen of the stomach and the external disc was cinched. Contrast was injected and a post procedural spot fluoroscopic image was obtained confirming appropriate positioning and functionality of the new gastrostomy tube. A dressing was applied. The patient tolerated the procedure well without immediate postprocedural complication. IMPRESSION: Successful fluoroscopic guided replacement of a new 14-French gastrostomy tube. The gastrostomy tube is ready for immediate use. Electronically Signed   By: Simonne Come M.D.   On: 04/07/2020 13:56    Procedures Procedures   Medications Ordered in ED Medications  lidocaine (XYLOCAINE) 2 % viscous mouth solution (  Not Given 04/07/20 1210)  iohexol (OMNIPAQUE) 300 MG/ML solution 50 mL (15 mLs Per Tube Contrast Given 04/07/20 1120)    ED Course  I have reviewed the triage vital signs and the nursing notes.  Pertinent labs & imaging results that were available during my care of the patient were reviewed by me and considered in my medical decision making (see chart for details).    MDM Rules/Calculators/A&P                          BP (!) 142/76 (BP Location: Right Arm)   Pulse 81   Temp 98.4 F (36.9 C) (Oral)   Resp 16   SpO2 98%   Final Clinical Impression(s) / ED Diagnoses Final diagnoses:  Dislodged gastrostomy tube    Rx / DC Orders ED Discharge Orders    None     9:16 AM Patient had a G-tube dislodgment sometime last night.  He needs that for continuous feeding.  From prior note appears to be a 26 [place].  We have attempted to insert a 10 [place]  Foley without any success.  I did reach out to IR specialist who recommend consulting for IR eval management so that an IR PA can replace it as appropriate.  Patient currently without any symptoms.  Last IR intervention in March 2022.  Care discussed with DR. Curatolo.  Interventional radiologist was able to successfully replace a 14 French gastrostomy tube under fluoroscopic guidance and it is immediately ready for use.  Patient tolerates well.  He is stable for discharge.   Fayrene Helper, PA-C 04/07/20 1524    Curatolo, Adam, DO 04/07/20 [phone]

## 2020-04-07 NOTE — ED Notes (Signed)
[facility] CALLED BY Burnette Valenti UNABLE TO GIVE TIME  

## 2020-04-07 NOTE — Procedures (Signed)
Pre procedural Dx: Dysphagia, poorly functioning feeding tube. Post procedural Dx: Same  Successful fluoroscopic guided replacement of existing 14 Fr gastrostomy tube.   The feeding tube is ready for immediate use.  EBL: None  Complications: None immediate.  Katherina Right, MD Pager #: [phone]

## 2020-05-24 ENCOUNTER — Other Ambulatory Visit: Payer: Self-pay

## 2020-05-24 ENCOUNTER — Emergency Department (HOSPITAL_COMMUNITY): Payer: Medicare Other

## 2020-05-24 ENCOUNTER — Encounter (HOSPITAL_COMMUNITY): Payer: Self-pay

## 2020-05-24 ENCOUNTER — Emergency Department (HOSPITAL_COMMUNITY)
Admission: EM | Admit: 2020-05-24 | Discharge: 2020-05-24 | Disposition: A | Discharge status: Home / Self Care | Payer: Medicare Other | Attending: Emergency Medicine | Admitting: Emergency Medicine

## 2020-05-24 DIAGNOSIS — Z8616 Personal history of COVID-19: Secondary | ICD-10-CM | POA: Insufficient documentation

## 2020-05-24 DIAGNOSIS — I10 Essential (primary) hypertension: Secondary | ICD-10-CM | POA: Diagnosis not present

## 2020-05-24 DIAGNOSIS — K9423 Gastrostomy malfunction: Secondary | ICD-10-CM | POA: Insufficient documentation

## 2020-05-24 DIAGNOSIS — Z09 Encounter for follow-up examination after completed treatment for conditions other than malignant neoplasm: Secondary | ICD-10-CM

## 2020-05-24 DIAGNOSIS — Z431 Encounter for attention to gastrostomy: Secondary | ICD-10-CM

## 2020-05-24 MED ORDER — DIATRIZOATE MEGLUMINE & SODIUM 66-10 % PO SOLN
30.0000 mL | Freq: Once | ORAL | Status: AC
  Administered 2020-05-24: 30 mL

## 2020-05-24 NOTE — ED Triage Notes (Signed)
EMS reports that SNF was trying to flush peg tube and met resistance so they forced fluid in and now believe PEG tube has become dislodged

## 2020-05-24 NOTE — ED Notes (Signed)
Called [facility] for transport back to [facility] care. Patient discharged

## 2020-05-24 NOTE — ED Triage Notes (Signed)
Patient BIB [facility] from [facility] for possible peg tube obstruction. Patient has no c/o pain.

## 2020-05-24 NOTE — ED Provider Notes (Signed)
[facility]-EMERGENCY DEPT Provider Note   CSN: [id number] Arrival date & time: 05/24/20  1720     History Chief Complaint  Patient presents with  . peg tube obstructed    Robert Carpenter is a 87 y.o. male.  87 year old male here with possible G-tube malfunction.  Was at the nursing home today and had trouble flushing the patient's tube.  Concern for possible G-tube dislocation.  Patient himself has no complaints at this time.        Past Medical History:  Diagnosis Date  . Acute on chronic respiratory failure with hypoxia (HCC)   . Cardiac arrest (HCC)   . Chronic atrial fibrillation (HCC)   . COVID-19 virus infection   . Hypertension     Patient Active Problem List   Diagnosis Date Noted  . Acute on chronic respiratory failure with hypoxia (HCC)   . Chronic atrial fibrillation (HCC)   . Cardiac arrest (HCC)   . COVID-19 virus infection     Past Surgical History:  Procedure Laterality Date  . HIP SURGERY    . IR REPLC GASTRO/COLONIC TUBE PERCUT W/FLUORO  03/16/2020  . IR REPLC GASTRO/COLONIC TUBE PERCUT W/FLUORO  04/07/2020       No family history on file.  Social History   Tobacco Use  . Smoking status: Never Smoker  . Smokeless tobacco: Never Used  Substance Use Topics  . Alcohol use: Never  . Drug use: Never    Home Medications Prior to Admission medications   Medication Sig Start Date End Date Taking? Authorizing Provider  ibuprofen (ADVIL) 800 MG tablet Take 1 tablet (800 mg total) by mouth every 8 (eight) hours as needed. 12/15/19   Bethann Berkshire, MD    Allergies    Patient has no known allergies.  Review of Systems   Review of Systems  All other systems reviewed and are negative.   Physical Exam Updated Vital Signs There were no vitals taken for this visit.  Physical Exam Vitals and nursing note reviewed.  Constitutional:      General: He is not in acute distress.    Appearance: Normal appearance. He is  well-developed. He is not toxic-appearing.  HENT:     Head: Normocephalic and atraumatic.  Eyes:     General: Lids are normal.     Conjunctiva/sclera: Conjunctivae normal.     Pupils: Pupils are equal, round, and reactive to light.  Neck:     Thyroid: No thyroid mass.     Trachea: No tracheal deviation.  Cardiovascular:     Rate and Rhythm: Normal rate and regular rhythm.     Heart sounds: Normal heart sounds. No murmur heard. No gallop.   Pulmonary:     Effort: Pulmonary effort is normal. No respiratory distress.     Breath sounds: Normal breath sounds. No stridor. No decreased breath sounds, wheezing, rhonchi or rales.  Abdominal:     General: Bowel sounds are normal. There is no distension.     Palpations: Abdomen is soft.     Tenderness: There is no abdominal tenderness. There is no rebound.    Musculoskeletal:        General: No tenderness. Normal range of motion.     Cervical back: Normal range of motion and neck supple.  Skin:    General: Skin is warm and dry.     Findings: No abrasion or rash.  Neurological:     Mental Status: He is alert and oriented to person,  place, and time.     GCS: GCS eye subscore is 4. GCS verbal subscore is 5. GCS motor subscore is 6.     Cranial Nerves: No cranial nerve deficit.     Sensory: No sensory deficit.  Psychiatric:        Speech: Speech normal.        Behavior: Behavior normal.     ED Results / Procedures / Treatments   Labs (all labs ordered are listed, but only abnormal results are displayed) Labs Reviewed - No data to display  EKG None  Radiology No results found.  Procedures Procedures   Medications Ordered in ED Medications - No data to display  ED Course  I have reviewed the triage vital signs and the nursing notes.  Pertinent labs & imaging results that were available during my care of the patient were reviewed by me and considered in my medical decision making (see chart for details).    MDM  Rules/Calculators/A&P                          Patient abdominal flat plate with Gastrografin instilled by radiology and shows good placement of patient's G-tube.  Patient notes that he has not used in over a week as he is able to eat at this time.  Will discharge back to facility Final Clinical Impression(s) / ED Diagnoses Final diagnoses:  None    Rx / DC Orders ED Discharge Orders    None       Lorre Nick, MD 05/24/20 1842

## 2020-05-24 NOTE — Discharge Instructions (Addendum)
The G-tube is in good position here and functioning appropriately Follow-up with your doctor as needed

## 2020-05-26 ENCOUNTER — Emergency Department (HOSPITAL_COMMUNITY)
Admission: EM | Admit: 2020-05-26 | Discharge: 2020-05-26 | Disposition: A | Discharge status: Home / Self Care | Payer: Medicare Other | Attending: Emergency Medicine | Admitting: Emergency Medicine

## 2020-05-26 ENCOUNTER — Encounter (HOSPITAL_COMMUNITY): Payer: Self-pay | Admitting: Emergency Medicine

## 2020-05-26 DIAGNOSIS — Z87891 Personal history of nicotine dependence: Secondary | ICD-10-CM | POA: Insufficient documentation

## 2020-05-26 DIAGNOSIS — K9423 Gastrostomy malfunction: Secondary | ICD-10-CM | POA: Insufficient documentation

## 2020-05-26 DIAGNOSIS — Z8616 Personal history of COVID-19: Secondary | ICD-10-CM | POA: Insufficient documentation

## 2020-05-26 DIAGNOSIS — I10 Essential (primary) hypertension: Secondary | ICD-10-CM | POA: Diagnosis not present

## 2020-05-26 DIAGNOSIS — T85528A Displacement of other gastrointestinal prosthetic devices, implants and grafts, initial encounter: Secondary | ICD-10-CM

## 2020-05-26 NOTE — ED Provider Notes (Signed)
[facility] EMERGENCY DEPARTMENT Provider Note  CSN: [id number] Arrival date & time: 05/26/20 2057    History No chief complaint on file.   HPI  Robert Carpenter is a 87 y.o. male brought to the ED via EMS from SNF for evaluation after G-tube came out. Patient states he has not used it in 2 weeks and has been eating fine. I spoke with staff at SNF who report they still occasionally use it for tube feeds but he often eats enough not to need it. Patient does not want it put back in but will consent to replacement attempt since the staff reports they are still using it. He is unsure how long the tube has been out today.    Past Medical History:  Diagnosis Date  . Acute on chronic respiratory failure with hypoxia (HCC)   . Cardiac arrest (HCC)   . Chronic atrial fibrillation (HCC)   . COVID-19 virus infection   . Hypertension     Past Surgical History:  Procedure Laterality Date  . HIP SURGERY    . IR REPLC GASTRO/COLONIC TUBE PERCUT W/FLUORO  03/16/2020  . IR REPLC GASTRO/COLONIC TUBE PERCUT W/FLUORO  04/07/2020    No family history on file.  Social History   Tobacco Use  . Smoking status: Former Smoker    Types: Cigarettes    Quit date: 1970    Years since quitting: 52.4  . Smokeless tobacco: Never Used  Substance Use Topics  . Alcohol use: Not Currently  . Drug use: Never     Home Medications Prior to Admission medications   Medication Sig Start Date End Date Taking? Authorizing Provider  ibuprofen (ADVIL) 800 MG tablet Take 1 tablet (800 mg total) by mouth every 8 (eight) hours as needed. 12/15/19   Bethann Berkshire, MD     Allergies    Patient has no known allergies.   Review of Systems   Review of Systems A comprehensive review of systems was completed and negative except as noted in HPI.    Physical Exam BP 140/81   Pulse 70   Temp 98.1 F (36.7 C) (Oral)   Resp 17   Ht 6\' 2"  (1.88 m)   Wt 72.6 kg   SpO2 99%   BMI 20.54 kg/m   Physical  Exam Vitals and nursing note reviewed.  HENT:     Head: Normocephalic.     Nose: Nose normal.  Eyes:     Extraocular Movements: Extraocular movements intact.  Pulmonary:     Effort: Pulmonary effort is normal.  Abdominal:     Comments: Gastrostomy stoma in LUQ, no bleeding  Musculoskeletal:        General: Normal range of motion.     Cervical back: Neck supple.  Skin:    Findings: No rash (on exposed skin).  Neurological:     Mental Status: He is alert and oriented to person, place, and time.  Psychiatric:        Mood and Affect: Mood normal.      ED Results / Procedures / Treatments   Labs (all labs ordered are listed, but only abnormal results are displayed) Labs Reviewed - No data to display  EKG None  Radiology No results found.  Procedures Procedures  Medications Ordered in the ED Medications - No data to display   MDM Rules/Calculators/A&P MDM Per old notes, patient had 14Fr G-tube although it appears they have had difficulty with replacing in the past. I attempted with a  16Fr tube (the smallest available tonight) but was unable to pass the tube. Patient refuses additional attempts. Recommend he discuss the necessity of the tube with his providers at [facility] and they can schedule an IR replacement during daytime if so desired.  ED Course  I have reviewed the triage vital signs and the nursing notes.  Pertinent labs & imaging results that were available during my care of the patient were reviewed by me and considered in my medical decision making (see chart for details).     Final Clinical Impression(s) / ED Diagnoses Final diagnoses:  Dislodged gastrostomy tube    Rx / DC Orders ED Discharge Orders    None       Pollyann Savoy, MD 05/26/20 2230

## 2020-05-26 NOTE — Discharge Instructions (Addendum)
We were unable to replace the patient's G-tube tonight. He has refused any additional attempts in the ER. Please discuss the necessity of the tube with his doctor. If you decide the tube is necessary, please schedule an outpatient replacement with the Interventional Radiology department.

## 2020-05-26 NOTE — ED Triage Notes (Signed)
Patient presents from [facility] due to removing his own G tube. He does not believe he needs it any longer and should be able to take everything PO at this point. EMS recommend he come to see a MD to determine this.    EMS vitals: 126 BP palpated 69 HR 97%

## 2020-05-26 NOTE — ED Notes (Signed)
[facility] called for transport back to [facility] 

## 2020-06-10 ENCOUNTER — Emergency Department (HOSPITAL_COMMUNITY)
Admission: EM | Admit: 2020-06-10 | Discharge: 2020-06-11 | Disposition: A | Discharge status: Home / Self Care | Payer: Medicare Other | Attending: Emergency Medicine | Admitting: Emergency Medicine

## 2020-06-10 ENCOUNTER — Emergency Department (HOSPITAL_COMMUNITY): Payer: Medicare Other

## 2020-06-10 DIAGNOSIS — R4182 Altered mental status, unspecified: Secondary | ICD-10-CM | POA: Diagnosis present

## 2020-06-10 DIAGNOSIS — I10 Essential (primary) hypertension: Secondary | ICD-10-CM | POA: Diagnosis not present

## 2020-06-10 DIAGNOSIS — Z28311 Partially vaccinated for covid-19: Secondary | ICD-10-CM | POA: Diagnosis not present

## 2020-06-10 DIAGNOSIS — R5383 Other fatigue: Secondary | ICD-10-CM

## 2020-06-10 DIAGNOSIS — U071 COVID-19: Secondary | ICD-10-CM | POA: Diagnosis not present

## 2020-06-10 DIAGNOSIS — Z87891 Personal history of nicotine dependence: Secondary | ICD-10-CM | POA: Diagnosis not present

## 2020-06-10 DIAGNOSIS — Z8616 Personal history of COVID-19: Secondary | ICD-10-CM | POA: Insufficient documentation

## 2020-06-10 LAB — CBC WITH DIFFERENTIAL/PLATELET
Abs Immature Granulocytes: 0.03 10*3/uL (ref 0.00–0.07)
Basophils Absolute: 0 10*3/uL (ref 0.0–0.1)
Basophils Relative: 1 %
Eosinophils Absolute: 0 10*3/uL (ref 0.0–0.5)
Eosinophils Relative: 1 %
HCT: 39.6 % (ref 39.0–52.0)
Hemoglobin: 13 g/dL (ref 13.0–17.0)
Immature Granulocytes: 0 %
Lymphocytes Relative: 21 %
Lymphs Abs: 1.5 10*3/uL (ref 0.7–4.0)
MCH: 30 pg (ref 26.0–34.0)
MCHC: 32.8 g/dL (ref 30.0–36.0)
MCV: 91.2 fL (ref 80.0–100.0)
Monocytes Absolute: 0.5 10*3/uL (ref 0.1–1.0)
Monocytes Relative: 7 %
Neutro Abs: 5.2 10*3/uL (ref 1.7–7.7)
Neutrophils Relative %: 70 %
Platelets: 408 10*3/uL — ABNORMAL HIGH (ref 150–400)
RBC: 4.34 MIL/uL (ref 4.22–5.81)
RDW: 14.7 % (ref 11.5–15.5)
WBC: 7.4 10*3/uL (ref 4.0–10.5)
nRBC: 0 % (ref 0.0–0.2)

## 2020-06-10 LAB — COMPREHENSIVE METABOLIC PANEL
ALT: 16 U/L (ref 0–44)
AST: 19 U/L (ref 15–41)
Albumin: 2.8 g/dL — ABNORMAL LOW (ref 3.5–5.0)
Alkaline Phosphatase: 58 U/L (ref 38–126)
Anion gap: 10 (ref 5–15)
BUN: 16 mg/dL (ref 8–23)
CO2: 27 mmol/L (ref 22–32)
Calcium: 9 mg/dL (ref 8.9–10.3)
Chloride: 103 mmol/L (ref 98–111)
Creatinine, Ser: 0.95 mg/dL (ref 0.61–1.24)
GFR, Estimated: >60 mL/min (ref >60)
Glucose, Bld: 99 mg/dL (ref 70–99)
Potassium: 3.6 mmol/L (ref 3.5–5.1)
Sodium: 140 mmol/L (ref 135–145)
Total Bilirubin: 1 mg/dL (ref 0.3–1.2)
Total Protein: 7.8 g/dL (ref 6.5–8.1)

## 2020-06-10 LAB — URINALYSIS, ROUTINE W REFLEX MICROSCOPIC
Bacteria, UA: NONE SEEN
Bilirubin Urine: NEGATIVE
Glucose, UA: NEGATIVE mg/dL
Hgb urine dipstick: NEGATIVE
Ketones, ur: 20 mg/dL — AB
Nitrite: NEGATIVE
Protein, ur: 30 mg/dL — AB
Specific Gravity, Urine: 1.017 (ref 1.005–1.030)
pH: 5 (ref 5.0–8.0)

## 2020-06-10 LAB — CBG MONITORING, ED: Glucose-Capillary: 106 mg/dL — ABNORMAL HIGH (ref 70–99)

## 2020-06-10 MED ORDER — SODIUM CHLORIDE 0.9 % IV BOLUS
500.0000 mL | Freq: Once | INTRAVENOUS | Status: AC
  Administered 2020-06-10: 500 mL via INTRAVENOUS

## 2020-06-10 NOTE — ED Notes (Signed)
PTAR Called 

## 2020-06-10 NOTE — ED Notes (Signed)
Pt given food and drink per order.

## 2020-06-10 NOTE — ED Provider Notes (Signed)
[facility] EMERGENCY DEPARTMENT Provider Note   CSN: [id number] Arrival date & time: 06/10/20  1405     History Chief Complaint  Patient presents with   Altered Mental Status    Robert Carpenter is a 87 y.o. male history of chronic atrial fibrillation (on Eliquis), hypertension, acute on chronic respiratory failure, history of DVT.  He is a resident at [facility].  Patient is alert to person and time.  Level 5 caveat applies.  Per facility representative beyond, patient's mental status is alert to person and intermittently alert to time and place.  Facility representative reports that patient tested positive for COVID-19 on 06/06/20; today he had had low oxygen saturation today on room air and was somnolent.  He has been partially vaccinated for COVID-19 only receiving one of the 2 immunizations.  He received all of his morning medications.  He had unwitnessed fall on Wednesday, patient reported that he did not hit his head or lose consciousness to facility staff.  Left hip x-ray obtained after fall was reportedly unremarkable  Patient is a poor historian.  Patient is not sure why he is in the emergency department.  Patient denies any pain, discomfort, or shortness of breath at this time.   Altered Mental Status     Past Medical History:  Diagnosis Date   Acute on chronic respiratory failure with hypoxia (HCC)    Cardiac arrest (HCC)    Chronic atrial fibrillation (HCC)    COVID-19 virus infection    Hypertension     Patient Active Problem List   Diagnosis Date Noted   Acute on chronic respiratory failure with hypoxia (HCC)    Chronic atrial fibrillation (HCC)    Cardiac arrest (HCC)    COVID-19 virus infection     Past Surgical History:  Procedure Laterality Date   HIP SURGERY     IR REPLC GASTRO/COLONIC TUBE PERCUT W/FLUORO  03/16/2020   IR REPLC GASTRO/COLONIC TUBE PERCUT W/FLUORO  04/07/2020       No family history on  file.  Social History   Tobacco Use   Smoking status: Former    Pack years: 0.00    Types: Cigarettes    Quit date: 1970    Years since quitting: 52.4   Smokeless tobacco: Never  Substance Use Topics   Alcohol use: Not Currently   Drug use: Never    Home Medications Prior to Admission medications   Medication Sig Start Date End Date Taking? Authorizing Provider  ibuprofen (ADVIL) 800 MG tablet Take 1 tablet (800 mg total) by mouth every 8 (eight) hours as needed. 12/15/19   Bethann Berkshire, MD    Allergies    Patient has no known allergies.  Review of Systems   Review of Systems  Unable to perform ROS: Other (he is only alert to person and intermittently time and place at baseline)   Physical Exam Updated Vital Signs BP 107/78 (BP Location: Right Arm)   Pulse 74   Temp (!) 97.3 F (36.3 C) (Oral)   Resp (!) 23   SpO2 99%   Physical Exam Vitals and nursing note reviewed.  Constitutional:      General: He is not in acute distress.    Appearance: He is not ill-appearing, toxic-appearing or diaphoretic.  HENT:     Head: Normocephalic and atraumatic.  Eyes:     General: No scleral icterus.       Right eye: No discharge.  Left eye: No discharge.     Extraocular Movements: Extraocular movements intact.     Pupils: Pupils are equal, round, and reactive to light.  Cardiovascular:     Rate and Rhythm: Normal rate.  Pulmonary:     Effort: Pulmonary effort is normal. No tachypnea, bradypnea or respiratory distress.     Breath sounds: Normal breath sounds. No stridor.  Abdominal:     General: Abdomen is flat. Bowel sounds are increased. There is no distension. There are no signs of injury.     Palpations: Abdomen is soft. There is no mass or pulsatile mass.     Tenderness: There is no abdominal tenderness. There is no guarding or rebound.  Musculoskeletal:     Cervical back: Normal range of motion and neck supple. No rigidity.     Right lower leg: No swelling or  tenderness. No edema.     Left lower leg: No swelling or tenderness. No edema.  Skin:    General: Skin is warm and dry.     Coloration: Skin is not cyanotic or pale.  Neurological:     General: No focal deficit present.     Mental Status: He is alert. Mental status is at baseline.     GCS: GCS eye subscore is 3. GCS verbal subscore is 4. GCS motor subscore is 6.     Cranial Nerves: No cranial nerve deficit or facial asymmetry.     Sensory: Sensation is intact.     Motor: No weakness, tremor, seizure activity or pronator drift.     Coordination: Finger-Nose-Finger Test normal.     Comments: CN II-XII intact; performed in supine position, equal grip strength +5 strength to bilateral upper extremities, +5 strength to dorsiflexion and plantarflexion, patient able to left both legs against gravity and hold each there without difficulty   Patient was easily arousable to voice  Psychiatric:        Behavior: Behavior is cooperative.    ED Results / Procedures / Treatments   Labs (all labs ordered are listed, but only abnormal results are displayed) Labs Reviewed  CBG MONITORING, ED - Abnormal; Notable for the following components:      Result Value   Glucose-Capillary 106 (*)    All other components within normal limits  CBC WITH DIFFERENTIAL/PLATELET  COMPREHENSIVE METABOLIC PANEL  URINALYSIS, ROUTINE W REFLEX MICROSCOPIC  POC SARS CORONAVIRUS 2 AG -  ED    EKG None  Radiology No results found.  Procedures Procedures   Medications Ordered in ED Medications - No data to display  ED Course  I have reviewed the triage vital signs and the nursing notes.  Pertinent labs & imaging results that were available during my care of the patient were reviewed by me and considered in my medical decision making (see chart for details).    MDM Rules/Calculators/A&P                          87 year old male easily arousable to voice.  Patient is found to be alert to person and time  only.  Per facility staff patient is normally alert to person and intermittently time and place.  [facility] concerns of change in mental status and hypoxia.  Patient is a positive for COVID-19 on 6/6.  Patient has not fully vaccinated for COVID-19.  Patient is a poor historian.  Level 5 caveat applies due to his baseline  confusion.  Will obtain COVID-19 antigen test, chest x-ray, urinalysis, CMP, CBC.  We will also obtain CT head without contrast due to patient's recent fall and being on blood thinners.  Chest x-ray shows persistent bilateral opacities may reflect areas of scarring.  Superimposed acute process difficult to exclude.  Noncontrast head CT shows no acute intracranial abnormality.  Stable atrophy and chronic microvascular ischemic changes.  Patient care transferred to [facility] at the end of my shift. Patient presentation, ED course, and plan of care discussed with review of all pertinent labs and imaging. Please see his/her note for further details regarding further ED course and disposition.   At time of signout lab work and urinalysis pending.  Oxygen saturations 98% or greater on room air.  Patient appears to be at his baseline mental status.  If lab work is unremarkable and oxygen saturations remained in normal range plan to discharge patient back to skilled nursing facility.  Unsure when patient's symptoms started he may qualify for antiviral therapy.  Final Clinical Impression(s) / ED Diagnoses Final diagnoses:  None    Rx / DC Orders ED Discharge Orders     None        Haskel Schroeder, PA-C 06/10/20 1806    Rozelle Logan, DO 06/11/20 [phone]

## 2020-06-10 NOTE — ED Triage Notes (Addendum)
Pt arrives from [facility] via EMS. Diagnosed with Covid x5 days. Staff reports he is altered today. Pt is normally confused but ambulatory but today pt somnolent, fatigued. 22g IV established in R hand, 250 ml NS administered. BGL 150 BP 140/70 HR 100 RR 20 O2 99% RA

## 2020-06-10 NOTE — ED Provider Notes (Signed)
5:30 PM Signout from [employer] at shift change.   Patient with recent diagnosis of COVID.  Reportedly somnolent today.  Awaiting completion of work-up.  Chest x-ray with some chronic findings, cannot rule out superimposed pneumonia, but not very compelling overall.  Patient has not been hypoxic here.  BP 118/82   Pulse 71   Temp (!) 97.3 F (36.3 C) (Oral)   Resp (!) 23   SpO2 100%   6:29 PM patient rechecked.  He is sleeping, off oxygen normal oxygen saturation.  Does not appear to be in any distress.  10:36 PM there was a delay in obtaining urine.  This has now been obtained and resulted and does not show signs of infection.  Patient is up in bed watching TV.  He is alert and conversant.  He is not in any respiratory distress. RR documented into 30's here but when I check he is 22-24 breaths per minute. He speaks in full sentences without any signs of increased work of breathing.   Occasional tachycardia noted due to paroxysmal atrial fibrillation.  Spoke with patient's daughter Imelda Pillow. Will plan on d/c.   BP 103/84   Pulse 93   Temp (!) 97.3 F (36.3 C) (Oral)   Resp (!) 34   SpO2 98% Renne Crigler, Cordelia Poche 06/10/20 2242    Cathren Laine, MD 06/11/20 1535

## 2020-06-10 NOTE — ED Notes (Signed)
Attempted to call report to [facility]. No answer.

## 2020-06-10 NOTE — ED Notes (Signed)
Male pure wick put on pt

## 2020-06-10 NOTE — ED Notes (Signed)
Daughter TYREECE GELLES [phone] would like an update

## 2020-06-10 NOTE — Discharge Instructions (Signed)
Please read and follow all provided instructions.  Your diagnoses today include:  1. Lethargy   2. COVID-19     Tests performed today include: Blood cell counts (white, red, and platelets) Electrolytes  Kidney function test Urine test to check for infection - no infection Chest x-ray - no signs of significant pneumonia Head CT - no signs of bleeding or other injury to the brain Vital signs. See below for your results today.   Medications prescribed:  None  Take any prescribed medications only as directed.  Home care instructions:  Follow any educational materials contained in this packet.  Follow-up instructions: Please follow-up with your primary care provider in the next 3 days for further evaluation of your symptoms.   Return instructions:  Please return to the Emergency Department if you experience worsening symptoms.  Please return if you have worsening confusion, worsening trouble breathing, persistent vomiting. Please return if you have any other emergent concerns.  Additional Information:  Your vital signs today were: BP 103/84   Pulse 93   Temp (!) 97.3 F (36.3 C) (Oral)   Resp (!) 34   SpO2 98%  If your blood pressure (BP) was elevated above 135/85 this visit, please have this repeated by your doctor within one month. --------------

## 2020-06-27 ENCOUNTER — Other Ambulatory Visit (HOSPITAL_COMMUNITY): Payer: Self-pay | Admitting: Internal Medicine

## 2020-06-27 DIAGNOSIS — R1310 Dysphagia, unspecified: Secondary | ICD-10-CM

## 2020-06-29 ENCOUNTER — Other Ambulatory Visit: Payer: Self-pay | Admitting: Radiology

## 2020-06-30 ENCOUNTER — Encounter (HOSPITAL_COMMUNITY): Payer: Self-pay

## 2020-06-30 ENCOUNTER — Other Ambulatory Visit: Payer: Self-pay

## 2020-06-30 ENCOUNTER — Ambulatory Visit (HOSPITAL_COMMUNITY)
Admission: RE | Admit: 2020-06-30 | Discharge: 2020-06-30 | Disposition: A | Discharge status: Home / Self Care | Payer: Medicare Other | Source: Ambulatory Visit | Attending: Internal Medicine | Admitting: Internal Medicine

## 2020-06-30 DIAGNOSIS — Z539 Procedure and treatment not carried out, unspecified reason: Secondary | ICD-10-CM | POA: Insufficient documentation

## 2020-06-30 DIAGNOSIS — Z79899 Other long term (current) drug therapy: Secondary | ICD-10-CM | POA: Insufficient documentation

## 2020-06-30 DIAGNOSIS — Z794 Long term (current) use of insulin: Secondary | ICD-10-CM | POA: Insufficient documentation

## 2020-06-30 DIAGNOSIS — Z87891 Personal history of nicotine dependence: Secondary | ICD-10-CM | POA: Diagnosis not present

## 2020-06-30 DIAGNOSIS — Z7901 Long term (current) use of anticoagulants: Secondary | ICD-10-CM | POA: Insufficient documentation

## 2020-06-30 DIAGNOSIS — I482 Chronic atrial fibrillation, unspecified: Secondary | ICD-10-CM | POA: Insufficient documentation

## 2020-06-30 DIAGNOSIS — Z8616 Personal history of COVID-19: Secondary | ICD-10-CM | POA: Insufficient documentation

## 2020-06-30 DIAGNOSIS — R1310 Dysphagia, unspecified: Secondary | ICD-10-CM | POA: Diagnosis not present

## 2020-06-30 DIAGNOSIS — Z86718 Personal history of other venous thrombosis and embolism: Secondary | ICD-10-CM | POA: Diagnosis not present

## 2020-06-30 LAB — BASIC METABOLIC PANEL
Anion gap: 7 (ref 5–15)
BUN: 9 mg/dL (ref 8–23)
CO2: 35 mmol/L — ABNORMAL HIGH (ref 22–32)
Calcium: 9.1 mg/dL (ref 8.9–10.3)
Chloride: 97 mmol/L — ABNORMAL LOW (ref 98–111)
Creatinine, Ser: 1.08 mg/dL (ref 0.61–1.24)
GFR, Estimated: >60 mL/min (ref >60)
Glucose, Bld: 102 mg/dL — ABNORMAL HIGH (ref 70–99)
Potassium: 5.4 mmol/L — ABNORMAL HIGH (ref 3.5–5.1)
Sodium: 139 mmol/L (ref 135–145)

## 2020-06-30 LAB — CBC
HCT: 41.3 % (ref 39.0–52.0)
Hemoglobin: 13.5 g/dL (ref 13.0–17.0)
MCH: 29.7 pg (ref 26.0–34.0)
MCHC: 32.7 g/dL (ref 30.0–36.0)
MCV: 90.8 fL (ref 80.0–100.0)
Platelets: 299 10*3/uL (ref 150–400)
RBC: 4.55 MIL/uL (ref 4.22–5.81)
RDW: 15 % (ref 11.5–15.5)
WBC: 5.9 10*3/uL (ref 4.0–10.5)
nRBC: 0 % (ref 0.0–0.2)

## 2020-06-30 MED ORDER — CEFAZOLIN SODIUM-DEXTROSE 2-4 GM/100ML-% IV SOLN: 2.0000 g | INTRAVENOUS | Status: DC

## 2020-06-30 MED ORDER — SODIUM CHLORIDE 0.9 % IV SOLN: INTRAVENOUS | Status: DC

## 2020-06-30 NOTE — H&P (Signed)
Chief Complaint: Gastrostomy tube replacement  Referring Physician(s): Eloisa Northern  Supervising Physician: Ruel Favors  Patient Status: [facility] - Out-pt  History of Present Illness: Robert Carpenter is a 87 y.o. male with history acute on chronic respiratory failure.  He underwent trach placement on 01/25/20, A fib, h/o COVID, bilateral DVT ( on eliquis), dysphagia s/p gastrostomy tube placement in February 2022.  He had replacement of the tube 03/16/2020 by Dr. Fredia Sorrow after it was inadvertently removed.  It was replaced again on 04/07/2020 by Dr. Grace Isaac after another inadvertent removal.  The patient has again removed it, about 5 weeks ago.  Per his son, who is also POA, he is eating but is not maintaining his weight so the Gtube is needed for supplementation.   He is NPO. No nausea/vomiting. No Fever/chills. ROS negative.   Past Medical History:  Diagnosis Date   Acute on chronic respiratory failure with hypoxia (HCC)    Cardiac arrest (HCC)    Chronic atrial fibrillation (HCC)    COVID-19 virus infection    Hypertension     Past Surgical History:  Procedure Laterality Date   HIP SURGERY     IR REPLC GASTRO/COLONIC TUBE PERCUT W/FLUORO  03/16/2020   IR REPLC GASTRO/COLONIC TUBE PERCUT W/FLUORO  04/07/2020    Allergies: Patient has no known allergies.  Medications: Prior to Admission medications   Medication Sig Start Date End Date Taking? Authorizing Provider  amantadine (SYMMETREL) 100 MG capsule Take 100 mg by mouth daily.   Yes [provider]  amLODipine (NORVASC) 10 MG tablet Take 10 mg by mouth daily.   Yes [provider]  apixaban (ELIQUIS) 5 MG TABS tablet Take 5 mg by mouth 2 (two) times daily.   Yes [provider]  diclofenac Sodium (VOLTAREN) 1 % GEL Apply 2 g topically in the morning and at bedtime. To left hip   Yes [provider]  famotidine (PEPCID) 20 MG tablet Take 20 mg by mouth 2 (two) times daily.   Yes  [provider]  guaiFENesin (ROBITUSSIN) 100 MG/5ML liquid Take 200 mg by mouth every 4 (four) hours as needed for cough.   Yes [provider]  insulin lispro (HUMALOG) 100 UNIT/ML injection Inject 2-10 Units into the skin 3 (three) times daily before meals. 150-200 = 2, 201-250= 4, 251-300= 6 units, 301-350 = 8 units, 351-400 = 10 units   Yes [provider]  melatonin 3 MG TABS tablet Take 3 mg by mouth at bedtime.   Yes [provider]  mirtazapine (REMERON) 7.5 MG tablet Take 7.5 mg by mouth at bedtime.   Yes [provider]  Multiple Vitamin (MULTIVITAMIN WITH MINERALS) TABS tablet Take 1 tablet by mouth daily.   Yes [provider]  Omega-3 Fatty Acids (FISH OIL) 1000 MG CAPS Take 1,000 mg by mouth daily.   Yes [provider]  senna (SENOKOT) 8.6 MG TABS tablet Take 1 tablet by mouth at bedtime.   Yes [provider]  sertraline (ZOLOFT) 50 MG tablet Take 50 mg by mouth daily.   Yes [provider]     No family history on file.  Social History   Socioeconomic History   Marital status: Married    Spouse name: Not on file   Number of children: Not on file   Years of education: Not on file   Highest education level: Not on file  Occupational History   Not on file  Tobacco  Use   Smoking status: Former    Pack years: 0.00    Types: Cigarettes    Quit date: 1970    Years since quitting: 52.5   Smokeless tobacco: Never  Substance and Sexual Activity   Alcohol use: Not Currently   Drug use: Never   Sexual activity: Not on file  Other Topics Concern   Not on file  Social History Narrative   Not on file   Social Determinants of Health   Financial Resource Strain: Not on file  Food Insecurity: Not on file  Transportation Needs: Not on file  Physical Activity: Not on file  Stress: Not on file  Social Connections: Not on file     Review of Systems: A 12 point ROS discussed and pertinent  positives are indicated in the HPI above.  All other systems are negative.  Review of Systems  Vital Signs: BP (!) 153/84   Pulse 62   Temp 98 F (36.7 C) (Oral)   Wt 72.6 kg   SpO2 93%   BMI 20.55 kg/m   Physical Exam Vitals reviewed.  Constitutional:      Appearance: Normal appearance.  HENT:     Head: Normocephalic and atraumatic.  Eyes:     Extraocular Movements: Extraocular movements intact.  Cardiovascular:     Rate and Rhythm: Normal rate and regular rhythm.  Pulmonary:     Effort: Pulmonary effort is normal. No respiratory distress.     Breath sounds: Normal breath sounds.  Abdominal:     General: There is no distension.     Palpations: Abdomen is soft.     Tenderness: There is no abdominal tenderness.  Musculoskeletal:        General: Normal range of motion.     Cervical back: Normal range of motion.  Skin:    General: Skin is warm and dry.  Neurological:     General: No focal deficit present.     Mental Status: He is alert and oriented to person, place, and time.  Psychiatric:        Mood and Affect: Mood normal.        Behavior: Behavior normal.        Thought Content: Thought content normal.    Imaging: CT Head Wo Contrast  Result Date: 06/10/2020 CLINICAL DATA:  Altered mental status. Recently diagnosed with COVID. EXAM: CT HEAD WITHOUT CONTRAST TECHNIQUE: Contiguous axial images were obtained from the base of the skull through the vertex without intravenous contrast. COMPARISON:  CT head dated December 14, 2019. FINDINGS: Brain: No evidence of acute infarction, hemorrhage, hydrocephalus, extra-axial collection or mass lesion/mass effect. Stable atrophy and chronic microvascular ischemic changes. Vascular: Atherosclerotic vascular calcification of the carotid siphons. No hyperdense vessel. Skull: Normal. Negative for fracture or focal lesion. Sinuses/Orbits: No acute finding. Other: Multiple metallic foreign bodies within the scalp are unchanged.  IMPRESSION: 1. No acute intracranial abnormality. 2. Stable atrophy and chronic microvascular ischemic changes. Electronically Signed   By: Obie Dredge M.D.   On: 06/10/2020 15:52   DG Chest Portable 1 View  Result Date: 06/10/2020 CLINICAL DATA:  Cough, COVID positive EXAM: PORTABLE CHEST 1 VIEW COMPARISON:  03/10/2020 FINDINGS: Tracheostomy device is no longer present. Low lung volumes with elevation of the left hemidiaphragm similar to prior study. There are persistent opacities at the left lung base and peripheral right lung and lung base. No significant pleural effusion. Similar cardiomediastinal contours with probable cardiomegaly. IMPRESSION: Persistent bilateral opacities may reflect areas of  scarring. Superimposed acute process difficult to exclude. Electronically Signed   By: Guadlupe Spanish M.D.   On: 06/10/2020 15:40    Labs:  CBC: Recent Labs    03/23/20 0426 03/26/20 0952 03/29/20 0550 06/10/20 1438  WBC 4.9 7.7 6.2 7.4  HGB 11.5* 13.8 13.0 13.0  HCT 34.5* 41.3 39.0 39.6  PLT 316 365 344 408*    COAGS: Recent Labs    03/10/20 0409  INR 1.4*    BMP: Recent Labs    03/23/20 0426 03/26/20 0952 03/29/20 0550 06/10/20 1438  NA 137 138 142 140  K 3.5 3.8 3.4* 3.6  CL 100 102 103 103  CO2 32 25 31 27   GLUCOSE 107* 118* 100* 99  BUN 18 23 22 16   CALCIUM 9.0 9.4 9.2 9.0  CREATININE 0.90 0.94 0.84 0.95  GFRNONAA >60 >60 >60 >60    LIVER FUNCTION TESTS: Recent Labs    12/14/19 1754 03/10/20 0409 06/10/20 1438  BILITOT 0.7 0.7 1.0  AST 66* 26 19  ALT 41 29 16  ALKPHOS 46 59 58  PROT 7.5 7.7 7.8  ALBUMIN 3.7 2.5* 2.8*    TUMOR MARKERS: No results for input(s): AFPTM, CEA, CA199, CHROMGRNA in the last 8760 hours.  Assessment and Plan:  Gastrostomy tube pulled by patient.  The patient is adamant that we do NOT place the tube, however I am unsure his judgment is intact.  Is oriented to person but states he is in the doctors office. He can tell  answer questions appropriately.   His son would like the tube replaced, however did tell me that "if he gives you a fit about it, don't do it".  Will have Dr. 05/10/20 evaluate patient prior to attempt. Will NOT force gtube placement on this patient if he is unwilling even though judgement is questionable and son wishes to proceed.  Risks and benefits image guided gastrostomy tube replacement was discussed with the patient and his son including, but not limited to the need for a barium enema during the procedure, bleeding, infection, peritonitis and/or damage to adjacent structures.  All questions were answered.  Phone consent obtained from son and in chart.  Electronically Signed: 08/10/20, PA-C   06/30/2020, 8:30 AM      I spent a total of   25 Minutes in face to face in clinical consultation, greater than 50% of which was counseling/coordinating care for gtube replacement.

## 2020-06-30 NOTE — Progress Notes (Signed)
Pt adamantly refused placement of gtube. MD and PA aware. Transportation called to pick pt up.

## 2021-01-12 ENCOUNTER — Encounter (HOSPITAL_COMMUNITY): Payer: Self-pay | Admitting: Emergency Medicine

## 2021-01-12 ENCOUNTER — Other Ambulatory Visit: Payer: Self-pay

## 2021-01-12 ENCOUNTER — Inpatient Hospital Stay (HOSPITAL_COMMUNITY)
Admission: EM | Admit: 2021-01-12 | Discharge: 2021-01-23 | DRG: 193 | Disposition: A | Discharge status: Skilled Nursing Facility | Payer: Medicare Other | Source: Skilled Nursing Facility | Attending: Family Medicine | Admitting: Family Medicine

## 2021-01-12 ENCOUNTER — Emergency Department (HOSPITAL_COMMUNITY): Payer: Medicare Other

## 2021-01-12 DIAGNOSIS — R0989 Other specified symptoms and signs involving the circulatory and respiratory systems: Secondary | ICD-10-CM

## 2021-01-12 DIAGNOSIS — J398 Other specified diseases of upper respiratory tract: Secondary | ICD-10-CM

## 2021-01-12 DIAGNOSIS — Z79899 Other long term (current) drug therapy: Secondary | ICD-10-CM

## 2021-01-12 DIAGNOSIS — J811 Chronic pulmonary edema: Secondary | ICD-10-CM

## 2021-01-12 DIAGNOSIS — I482 Chronic atrial fibrillation, unspecified: Secondary | ICD-10-CM | POA: Diagnosis not present

## 2021-01-12 DIAGNOSIS — Z8616 Personal history of COVID-19: Secondary | ICD-10-CM

## 2021-01-12 DIAGNOSIS — A419 Sepsis, unspecified organism: Principal | ICD-10-CM | POA: Diagnosis present

## 2021-01-12 DIAGNOSIS — Z87891 Personal history of nicotine dependence: Secondary | ICD-10-CM

## 2021-01-12 DIAGNOSIS — Z931 Gastrostomy status: Secondary | ICD-10-CM

## 2021-01-12 DIAGNOSIS — I110 Hypertensive heart disease with heart failure: Secondary | ICD-10-CM | POA: Diagnosis present

## 2021-01-12 DIAGNOSIS — J9601 Acute respiratory failure with hypoxia: Secondary | ICD-10-CM | POA: Diagnosis present

## 2021-01-12 DIAGNOSIS — I10 Essential (primary) hypertension: Secondary | ICD-10-CM | POA: Diagnosis not present

## 2021-01-12 DIAGNOSIS — R0902 Hypoxemia: Secondary | ICD-10-CM

## 2021-01-12 DIAGNOSIS — Z8674 Personal history of sudden cardiac arrest: Secondary | ICD-10-CM

## 2021-01-12 DIAGNOSIS — R739 Hyperglycemia, unspecified: Secondary | ICD-10-CM | POA: Diagnosis present

## 2021-01-12 DIAGNOSIS — R7881 Bacteremia: Secondary | ICD-10-CM

## 2021-01-12 DIAGNOSIS — I5022 Chronic systolic (congestive) heart failure: Secondary | ICD-10-CM | POA: Diagnosis present

## 2021-01-12 DIAGNOSIS — Z20822 Contact with and (suspected) exposure to covid-19: Secondary | ICD-10-CM | POA: Diagnosis present

## 2021-01-12 DIAGNOSIS — J159 Unspecified bacterial pneumonia: Principal | ICD-10-CM | POA: Diagnosis present

## 2021-01-12 DIAGNOSIS — E041 Nontoxic single thyroid nodule: Secondary | ICD-10-CM | POA: Diagnosis present

## 2021-01-12 DIAGNOSIS — Z86718 Personal history of other venous thrombosis and embolism: Secondary | ICD-10-CM

## 2021-01-12 DIAGNOSIS — J189 Pneumonia, unspecified organism: Secondary | ICD-10-CM | POA: Diagnosis not present

## 2021-01-12 DIAGNOSIS — R509 Fever, unspecified: Secondary | ICD-10-CM | POA: Diagnosis not present

## 2021-01-12 DIAGNOSIS — I440 Atrioventricular block, first degree: Secondary | ICD-10-CM | POA: Diagnosis present

## 2021-01-12 DIAGNOSIS — Z7901 Long term (current) use of anticoagulants: Secondary | ICD-10-CM

## 2021-01-12 DIAGNOSIS — Z794 Long term (current) use of insulin: Secondary | ICD-10-CM

## 2021-01-12 DIAGNOSIS — N179 Acute kidney failure, unspecified: Secondary | ICD-10-CM | POA: Diagnosis present

## 2021-01-12 LAB — COMPREHENSIVE METABOLIC PANEL
ALT: 201 U/L — ABNORMAL HIGH (ref 0–44)
AST: 65 U/L — ABNORMAL HIGH (ref 15–41)
Albumin: 3.5 g/dL (ref 3.5–5.0)
Alkaline Phosphatase: 134 U/L — ABNORMAL HIGH (ref 38–126)
Anion gap: 3 — ABNORMAL LOW (ref 5–15)
BUN: 20 mg/dL (ref 8–23)
CO2: 26 mmol/L (ref 22–32)
Calcium: 8.4 mg/dL — ABNORMAL LOW (ref 8.9–10.3)
Chloride: 105 mmol/L (ref 98–111)
Creatinine, Ser: 1.38 mg/dL — ABNORMAL HIGH (ref 0.61–1.24)
GFR, Estimated: 50 mL/min — ABNORMAL LOW (ref >60)
Glucose, Bld: 182 mg/dL — ABNORMAL HIGH (ref 70–99)
Potassium: 4.2 mmol/L (ref 3.5–5.1)
Sodium: 134 mmol/L — ABNORMAL LOW (ref 135–145)
Total Bilirubin: 1.2 mg/dL (ref 0.3–1.2)
Total Protein: 7.3 g/dL (ref 6.5–8.1)

## 2021-01-12 LAB — RESP PANEL BY RT-PCR (FLU A&B, COVID) ARPGX2
Influenza A by PCR: NEGATIVE
Influenza B by PCR: NEGATIVE
SARS Coronavirus 2 by RT PCR: NEGATIVE

## 2021-01-12 LAB — CBC WITH DIFFERENTIAL/PLATELET
Abs Immature Granulocytes: 0.08 10*3/uL — ABNORMAL HIGH (ref 0.00–0.07)
Basophils Absolute: 0 10*3/uL (ref 0.0–0.1)
Basophils Relative: 0 %
Eosinophils Absolute: 0 10*3/uL (ref 0.0–0.5)
Eosinophils Relative: 0 %
HCT: 41.4 % (ref 39.0–52.0)
Hemoglobin: 13.8 g/dL (ref 13.0–17.0)
Immature Granulocytes: 1 %
Lymphocytes Relative: 5 %
Lymphs Abs: 0.6 10*3/uL — ABNORMAL LOW (ref 0.7–4.0)
MCH: 30.9 pg (ref 26.0–34.0)
MCHC: 33.3 g/dL (ref 30.0–36.0)
MCV: 92.8 fL (ref 80.0–100.0)
Monocytes Absolute: 0.8 10*3/uL (ref 0.1–1.0)
Monocytes Relative: 6 %
Neutro Abs: 11.5 10*3/uL — ABNORMAL HIGH (ref 1.7–7.7)
Neutrophils Relative %: 88 %
Platelets: 224 10*3/uL (ref 150–400)
RBC: 4.46 MIL/uL (ref 4.22–5.81)
RDW: 14.2 % (ref 11.5–15.5)
WBC: 13 10*3/uL — ABNORMAL HIGH (ref 4.0–10.5)
nRBC: 0 % (ref 0.0–0.2)

## 2021-01-12 LAB — LACTIC ACID, PLASMA
Lactic Acid, Venous: 3.6 mmol/L (ref 0.5–1.9)
Lactic Acid, Venous: 3.4 mmol/L (ref 0.5–1.9)

## 2021-01-12 MED ORDER — IPRATROPIUM-ALBUTEROL 0.5-2.5 (3) MG/3ML IN SOLN
3.0000 mL | Freq: Once | RESPIRATORY_TRACT | Status: AC
  Administered 2021-01-12: 3 mL via RESPIRATORY_TRACT
  Filled 2021-01-12: qty 3

## 2021-01-12 MED ORDER — GUAIFENESIN 100 MG/5ML PO LIQD
5.0000 mL | Freq: Once | ORAL | Status: AC
  Administered 2021-01-12: 5 mL via ORAL
  Filled 2021-01-12: qty 10

## 2021-01-12 MED ORDER — SODIUM CHLORIDE 0.9 % IV BOLUS
2500.0000 mL | Freq: Once | INTRAVENOUS | Status: AC
  Administered 2021-01-12: 2500 mL via INTRAVENOUS

## 2021-01-12 MED ORDER — SODIUM CHLORIDE 0.9 % IV SOLN
500.0000 mg | Freq: Once | INTRAVENOUS | Status: AC
  Administered 2021-01-13: 500 mg via INTRAVENOUS
  Filled 2021-01-12: qty 5

## 2021-01-12 MED ORDER — ACETAMINOPHEN 325 MG PO TABS
650.0000 mg | ORAL_TABLET | Freq: Once | ORAL | Status: AC
  Administered 2021-01-12: 650 mg via ORAL
  Filled 2021-01-12: qty 2

## 2021-01-12 MED ORDER — SODIUM CHLORIDE 0.9 % IV SOLN
1.0000 g | Freq: Once | INTRAVENOUS | Status: AC
  Administered 2021-01-13: 1 g via INTRAVENOUS
  Filled 2021-01-12: qty 10

## 2021-01-12 MED ORDER — SODIUM CHLORIDE 0.9 % IV BOLUS
500.0000 mL | Freq: Once | INTRAVENOUS | Status: AC
  Administered 2021-01-12: 500 mL via INTRAVENOUS

## 2021-01-12 NOTE — ED Provider Notes (Signed)
[facility] DEPT Provider Note   CSN: [id number] Arrival date & time: 01/12/21  1812     History  Chief Complaint  Patient presents with   Fever    Robert Carpenter is a 88 y.o. male.  HPI He presents for evaluation of cough and fever.  He lives in assisted living facility where multiple people have viral illnesses by report of EMS.  During transport his oxygen saturation was low at 85% he was placed on nasal cannula oxygen with improvement of his oxygenation to 96%.  Patient states he has had COVID-vaccine.  He denies vomiting, chest pain, weakness or dizziness.    Home Medications Prior to Admission medications   Medication Sig Start Date End Date Taking? Authorizing Provider  amantadine (SYMMETREL) 100 MG capsule Take 100 mg by mouth daily.    [provider]  amLODipine (NORVASC) 10 MG tablet Take 10 mg by mouth daily.    [provider]  apixaban (ELIQUIS) 5 MG TABS tablet Take 5 mg by mouth 2 (two) times daily.    [provider]  diclofenac Sodium (VOLTAREN) 1 % GEL Apply 2 g topically in the morning and at bedtime. To left hip    [provider]  famotidine (PEPCID) 20 MG tablet Take 20 mg by mouth 2 (two) times daily.    [provider]  guaiFENesin (ROBITUSSIN) 100 MG/5ML liquid Take 200 mg by mouth every 4 (four) hours as needed for cough.    [provider]  insulin lispro (HUMALOG) 100 UNIT/ML injection Inject 2-10 Units into the skin 3 (three) times daily before meals. 150-200 = 2, 201-250= 4, 251-300= 6 units, 301-350 = 8 units, 351-400 = 10 units    [provider]  melatonin 3 MG TABS tablet Take 3 mg by mouth at bedtime.    [provider]  mirtazapine (REMERON) 7.5 MG tablet Take 7.5 mg by mouth at bedtime.    [provider]  Multiple Vitamin (MULTIVITAMIN WITH MINERALS) TABS tablet Take 1 tablet by mouth daily.    [provider]  Omega-3 Fatty  Acids (FISH OIL) 1000 MG CAPS Take 1,000 mg by mouth daily.    [provider]  senna (SENOKOT) 8.6 MG TABS tablet Take 1 tablet by mouth at bedtime.    [provider]  sertraline (ZOLOFT) 50 MG tablet Take 50 mg by mouth daily.    [provider]      Allergies    Patient has no known allergies.    Review of Systems   Review of Systems  All other systems reviewed and are negative.  Physical Exam Updated Vital Signs BP 120/60 (BP Location: Left Arm)    Pulse 81    Temp 99.3 F (37.4 C) (Oral)    Resp 20    SpO2 93%  Physical Exam Vitals and nursing note reviewed.  Constitutional:      General: He is not in acute distress.    Appearance: He is well-developed. He is not ill-appearing, toxic-appearing or diaphoretic.  HENT:     Head: Normocephalic and atraumatic.     Right Ear: External ear normal.     Left Ear: External ear normal.  Eyes:     Conjunctiva/sclera: Conjunctivae normal.     Pupils: Pupils are equal, round, and reactive to light.  Neck:     Trachea: Phonation normal.  Cardiovascular:     Rate and Rhythm: Regular rhythm. Tachycardia present.  Heart sounds: Normal heart sounds.  Pulmonary:     Effort: Pulmonary effort is normal. No respiratory distress.     Breath sounds: No stridor. Rhonchi present. No wheezing.  Chest:     Chest wall: No tenderness.  Abdominal:     General: There is no distension.     Palpations: Abdomen is soft.     Tenderness: There is no abdominal tenderness.  Musculoskeletal:        General: No swelling or tenderness. Normal range of motion.     Cervical back: Normal range of motion and neck supple.     Right lower leg: No edema.     Left lower leg: No edema.  Skin:    General: Skin is warm and dry.  Neurological:     Mental Status: He is alert and oriented to person, place, and time.     Cranial Nerves: No cranial nerve deficit.     Sensory: No sensory deficit.     Motor: No abnormal muscle tone.      Coordination: Coordination normal.  Psychiatric:        Mood and Affect: Mood normal.        Behavior: Behavior normal.        Thought Content: Thought content normal.        Judgment: Judgment normal.    ED Results / Procedures / Treatments   Labs (all labs ordered are listed, but only abnormal results are displayed) Labs Reviewed  COMPREHENSIVE METABOLIC PANEL - Abnormal; Notable for the following components:      Result Value   Sodium 134 (*)    Glucose, Bld 182 (*)    Creatinine, Ser 1.38 (*)    Calcium 8.4 (*)    AST 65 (*)    ALT 201 (*)    Alkaline Phosphatase 134 (*)    GFR, Estimated 50 (*)    Anion gap 3 (*)    All other components within normal limits  CBC WITH DIFFERENTIAL/PLATELET - Abnormal; Notable for the following components:   WBC 13.0 (*)    Neutro Abs 11.5 (*)    Lymphs Abs 0.6 (*)    Abs Immature Granulocytes 0.08 (*)    All other components within normal limits  LACTIC ACID, PLASMA - Abnormal; Notable for the following components:   Lactic Acid, Venous 3.6 (*)    All other components within normal limits  LACTIC ACID, PLASMA - Abnormal; Notable for the following components:   Lactic Acid, Venous 3.4 (*)    All other components within normal limits  RESP PANEL BY RT-PCR (FLU A&B, COVID) ARPGX2  CULTURE, BLOOD (ROUTINE X 2)  CULTURE, BLOOD (ROUTINE X 2)    EKG EKG Interpretation  Date/Time:  Thursday January 12 2021 19:35:52 EST Ventricular Rate:  145 PR Interval:    QRS Duration: 135 QT Interval:  332 QTC Calculation: 516 R Axis:   -70 Text Interpretation: Wide-QRS tachycardia Left bundle branch block Since last tracing rate faster p wave not visible Otherwise no significant change Confirmed by Daleen Bo [id number]) on 01/12/2021 7:43:07 PM  Radiology DG Chest Port 1 View  Result Date: 01/12/2021 CLINICAL DATA:  Shortness of breath EXAM: PORTABLE CHEST 1 VIEW COMPARISON:  06/10/2020, 03/10/2020, 02/19/2020, 12/22/2019, CT lung bases  01/27/2020 FINDINGS: Bandlike opacities at the left base similar compared to previous exams and could represent scarring. Underlying reticular interstitial opacity suspect for a component of underlying chronic lung disease. More acute appearing superimposed ground-glass opacities in the  right thorax. Cardiomediastinal silhouette is enlarged. Tracheal deviation to the right. No pneumothorax. IMPRESSION: 1. Suspect that there is a component of underlying chronic lung disease and scarring. Probable acute superimposed ground-glass opacity in the right thorax which may be due to acute pneumonia. 2. Cardiomegaly 3. Tracheal deviation to the right suspicious for paratracheal mass or enlarged thyroid (CT neck images from 2022 not available for direct comparison at the time of dictation ) Electronically Signed   By: Donavan Foil M.D.   On: 01/12/2021 19:02    Procedures .Critical Care Performed by: Daleen Bo, MD Authorized by: Daleen Bo, MD   Critical care provider statement:    Critical care time (minutes):  35   Critical care start time:  01/12/2021 6:30 PM   Critical care end time:  01/12/2021 11:33 PM   Critical care time was exclusive of:  Separately billable procedures and treating other patients   Critical care was necessary to treat or prevent imminent or life-threatening deterioration of the following conditions:  Respiratory failure   Critical care was time spent personally by me on the following activities:  Blood draw for specimens, development of treatment plan with patient or surrogate, discussions with consultants, evaluation of patient's response to treatment, examination of patient, ordering and performing treatments and interventions, ordering and review of laboratory studies, ordering and review of radiographic studies, pulse oximetry, re-evaluation of patient's condition and review of old charts    Medications Ordered in ED Medications  cefTRIAXone (ROCEPHIN) 1 g in sodium  chloride 0.9 % 100 mL IVPB (has no administration in time range)  azithromycin (ZITHROMAX) 500 mg in sodium chloride 0.9 % 250 mL IVPB (has no administration in time range)  acetaminophen (TYLENOL) tablet 650 mg (650 mg Oral Given 01/12/21 1933)  sodium chloride 0.9 % bolus 500 mL (0 mLs Intravenous Stopped 01/12/21 2010)  ipratropium-albuterol (DUONEB) 0.5-2.5 (3) MG/3ML nebulizer solution 3 mL (3 mLs Nebulization Given 01/12/21 2111)  sodium chloride 0.9 % bolus 2,500 mL (0 mLs Intravenous Stopped 01/12/21 2208)  guaiFENesin (ROBITUSSIN) 100 MG/5ML liquid 5 mL (5 mLs Oral Given 01/12/21 2321)    ED Course/ Medical Decision Making/ A&P Clinical Course as of 01/13/21 0007  Thu Jan 12, 2021  1937 I was called to the room because the patient has become hypotensive and tachycardic.  Heart rate 148, somewhat irregular, and appears to be atrial flutter on the cardiac monitor.  Ordered EKG, IV fluids, lactate and blood cultures.  Will give IV fluid bolus. [EW]  2326 At this time the patient is much improved, he is saturating 96% on room air, not tachypneic or tachycardic.  Blood pressure is normal.  He states he is still coughing somewhat. [EW]    Clinical Course User Index [EW] Daleen Bo, MD                           Medical Decision Making   Patient Vitals for the past 24 hrs:  BP Temp Temp src Pulse Resp SpO2  01/12/21 2321 120/60 -- -- 81 20 93 %  01/12/21 2206 (!) 146/70 99.3 F (37.4 C) Oral 78 20 94 %  01/12/21 2200 (!) 146/70 -- -- 82 -- 100 %  01/12/21 2145 135/70 -- -- 78 (!) 28 99 %  01/12/21 2130 120/66 -- -- 78 (!) 21 98 %  01/12/21 2111 -- -- -- -- -- 94 %  01/12/21 2008 112/62 -- -- 88 18 93 %  01/12/21 1924 -- 99.4 F (37.4 C) Oral -- -- --  01/12/21 1820 120/71 (!) 102.3 F (39.1 C) Oral 97 19 96 %    11:20 PM Reevaluation with update and discussion with patient. After initial assessment and treatment, an updated evaluation reveals no change in clinical status.  Illness risk, worsening illness, discussed. [place] Decision Making: Summary of Illness/Injury: He is setting for evaluation of fever and cough with hypoxia, evaluated in the field by EMS.  He was treated with nasal cannula oxygen with improvement.  Critical Interventions-clinical evaluation, laboratory testing, radiography, support of oxygenation with nasal cannula oxygen, observation and reassessment; to evaluate  Chief Complaint  Patient presents with   Fever    and assess for illness characterized as Acute, Previously Undiagnosed, Uncertain Prognosis, Complicated, Systemic Symptoms, and Threat to Life/Bodily Function   The Differential Diagnoses include pneumonia, viral infection, metabolic disorder.  I did not require  Additional Historical Information from Anyone, as the patient is a good historian.     Clinical Laboratory Tests Ordered, included CBC, Metabolic panel, and lactate, blood cultures, viral panel . Review indicates normal except Vicon high, lactate high, sodium low, glucose high, creatinine high. Emergent testing abnormality management required for stabilization-elevated white count with abnormal chest x-ray, likely bacterial pneumonia, antibiotics ordered  Radiologic Tests Ordered, included chest x-ray.  I independently Visualized: Radiographic images, which show right-sided pneumonia  Cardiac Monitor Tracing which shows variable heart rate, as high as 145, followed by 75-88, indicating somewhat unstable cardiac rhythm  This patient is Presenting for Evaluation of cough, shortness of breath and hypoxia, which does require a range of treatment options, and is a complaint that involves a high risk of morbidity and mortality.  Pharmaceutical Risk Management intravenous antibiotics, parenteral IV fluids Parenteral Treatment    Treatment Complication Risk Evaluation indicates appropriate disposition isHospitalization  After These Interventions, the  Patient was reevaluated and was found with likely bacterial pneumonia, improved after initial treatment, lactate elevated but not increasing, oxygenation normal on room air.  Viral panel negative.  Patient had likely transient A. fib RVR, which responded to IV fluids.  He is currently anticoagulated patient is elderly and debilitated and would benefit from overnight observation, prior to discharge  12:07 AM-Consult complete with hospitalist. Patient case explained and discussed. Requirement for hospitalization agreed on. Possible Risk of decompensation and recurrent unstable cardiac rhythm.  He agrees to admit patient for further evaluation and treatment. Call ended at 12:05 AM  CRITICAL CARE-yes Performed by: Daleen Bo              Final Clinical Impression(s) / ED Diagnoses Final diagnoses:  Community acquired pneumonia of right lung, unspecified part of lung    Rx / DC Orders ED Discharge Orders     None         Daleen Bo, MD 01/13/21 [phone]

## 2021-01-12 NOTE — ED Triage Notes (Signed)
Patient BIB EMS from cough, fever. Per report pt c/o cough, fever, generalize weakness started yesterday. Per report pt is 85% on RA upon arrival. Per report flu is been around the facility for few weeks. Tylenol 650mg  was given by EMS. No report of n/v/d.  BP 110/70 HR 100 RR 20 O2 96% on 3L Temp 102.2

## 2021-01-12 NOTE — H&P (Addendum)
History and Physical   Robert Carpenter IOE:[id number] DOB: 03/07/1933 DOA: 01/12/2021  PCP: Rosaria Ferries, MD   Patient coming from: Nursing facility  Chief Complaint: Fever, cough, weakness  HPI: Robert Carpenter is a 88 y.o. male with medical history significant of atrial fibrillation, hypertension, history of DVT, history of cardiac arrest, history of gastrostomy and tracheostomy who presents from facility with concern for 1 day of fever, cough and increasing weakness.  As above patient presents from his facility where he lives with fever cough and weakness for 1 day.  EMS was called and noted to be saturating 85% on room air improved to 96% on nasal cannula.  There has reportedly been a respiratory illness going around at the facility possibly flu.  Patient reports he has had some ongoing medical issues ever since he was admitted with COVID and had a cardiac arrest last year.  Patient denies fever, chills, chest pain, abdominal pain, constipation, diarrhea, nausea, vomiting.   ED Course: Vital signs in the ED significant for blood pressure in the 182X to 937J systolic, respiratory rate in the teens to 20s on 2 L to maintain oxygen, temperature of 102.3, did have episode of significant tachycardia into the 140s.  Lab work-up shows CMP with sodium 134, creatinine elevated 1.38 from baseline of 1, glucose 182, calcium 8.4, AST 65, ALT 201, alk phos 134.  CBC with leukocytosis to 13.  Lactic acid initially elevated at 3.6 and then improving to 3.4 and repeat.  Respiratory panel for flu and COVID-negative.  Chest x-ray showed chronic lung disease with superimposed groundglass opacity on the right suspicious for pneumonia, cardiomegaly, tracheal deviation to the right suspicious for possible peritracheal mass or enlarged thyroid no prior to compare available to radiologist at the time of the read.  While in the ED patient developed what appears to be episode of atrial flutter with RVR  considering his history of A. fib rate in the 140s per EKG as below.  Patient responded to fluid bolus.  Patient additionally received azithromycin, ceftriaxone, Tylenol, above 3 L fluid bolus and DuoNeb in the ED.  Review of Systems: As per HPI otherwise all other systems reviewed and are negative.  Past Medical History:  Diagnosis Date   Acute on chronic respiratory failure with hypoxia (HCC)    Cardiac arrest (HCC)    Cardiac arrest ([place])    Chronic atrial fibrillation ([place])    COVID-19 virus infection    Hypertension     Past Surgical History:  Procedure Laterality Date   HIP SURGERY     IR [place] GASTRO/COLONIC TUBE PERCUT W/FLUORO  03/16/2020   IR [place] GASTRO/COLONIC TUBE PERCUT W/FLUORO  04/07/2020    Social History  reports that he quit smoking about 53 years ago. His smoking use included cigarettes. He has never used smokeless tobacco. He reports that he does not currently use alcohol. He reports that he does not use drugs.  No Known Allergies  History reviewed. No pertinent family history.  Prior to Admission medications   Medication Sig Start Date End Date Taking? Authorizing Provider  amantadine (SYMMETREL) 100 MG capsule Take 100 mg by mouth daily.    [provider]  amLODipine (NORVASC) 10 MG tablet Take 10 mg by mouth daily.    [provider]  apixaban (ELIQUIS) 5 MG TABS tablet Take 5 mg by mouth 2 (two) times daily.    [provider]  diclofenac Sodium (VOLTAREN) 1 % GEL Apply 2 g topically  in the morning and at bedtime. To left hip    [provider]  famotidine (PEPCID) 20 MG tablet Take 20 mg by mouth 2 (two) times daily.    [provider]  guaiFENesin (ROBITUSSIN) 100 MG/5ML liquid Take 200 mg by mouth every 4 (four) hours as needed for cough.    [provider]  insulin lispro (HUMALOG) 100 UNIT/ML injection Inject 2-10 Units into the skin 3 (three) times daily before meals. 150-200 = 2, 201-250= 4,  251-300= 6 units, 301-350 = 8 units, 351-400 = 10 units    [provider]  melatonin 3 MG TABS tablet Take 3 mg by mouth at bedtime.    [provider]  mirtazapine (REMERON) 7.5 MG tablet Take 7.5 mg by mouth at bedtime.    [provider]  Multiple Vitamin (MULTIVITAMIN WITH MINERALS) TABS tablet Take 1 tablet by mouth daily.    [provider]  Omega-3 Fatty Acids (FISH OIL) 1000 MG CAPS Take 1,000 mg by mouth daily.    [provider]  senna (SENOKOT) 8.6 MG TABS tablet Take 1 tablet by mouth at bedtime.    [provider]  sertraline (ZOLOFT) 50 MG tablet Take 50 mg by mouth daily.    [provider]    Physical Exam: Vitals:   01/12/21 2145 01/12/21 2200 01/12/21 2206 01/12/21 2321  BP: 135/70 (!) 146/70 (!) 146/70 120/60  Pulse: 78 82 78 81  Resp: (!) $RemoveB'28  20 20  'jsosUiUI$ Temp:   99.3 F (37.4 C)   TempSrc:   Oral   SpO2: 99% 100% 94% 93%   Physical Exam Constitutional:      General: He is not in acute distress.    Comments: Thin, somewhat frail appearing elderly male.  HENT:     Head: Normocephalic and atraumatic.     Mouth/Throat:     Mouth: Mucous membranes are moist.     Pharynx: Oropharynx is clear.  Eyes:     Extraocular Movements: Extraocular movements intact.     Pupils: Pupils are equal, round, and reactive to light.  Cardiovascular:     Rate and Rhythm: Normal rate and regular rhythm.     Pulses: Normal pulses.     Heart sounds: Normal heart sounds.  Pulmonary:     Effort: Pulmonary effort is normal. No respiratory distress.     Breath sounds: Normal breath sounds.  Abdominal:     General: Bowel sounds are normal. There is no distension.     Palpations: Abdomen is soft.     Tenderness: There is no abdominal tenderness.  Musculoskeletal:        General: No swelling or deformity.  Skin:    General: Skin is warm and dry.  Neurological:     General: No focal deficit present.     Mental Status: Mental  status is at baseline.   Labs on Admission: I have personally reviewed following labs and imaging studies  CBC: Recent Labs  Lab 01/12/21 2000  WBC 13.0*  NEUTROABS 11.5*  HGB 13.8  HCT 41.4  MCV 92.8  PLT 093    Basic Metabolic Panel: Recent Labs  Lab 01/12/21 2000  NA 134*  K 4.2  CL 105  CO2 26  GLUCOSE 182*  BUN 20  CREATININE 1.38*  CALCIUM 8.4*    GFR: CrCl cannot be calculated (Unknown ideal weight.).  Liver Function Tests: Recent Labs  Lab 01/12/21 2000  AST 65*  ALT 201*  ALKPHOS  134*  BILITOT 1.2  PROT 7.3  ALBUMIN 3.5    Urine analysis:    Component Value Date/Time   COLORURINE YELLOW 06/10/2020 2208   APPEARANCEUR HAZY (A) 06/10/2020 2208   LABSPEC 1.017 06/10/2020 2208   PHURINE 5.0 06/10/2020 [place] 06/10/2020 2208   HGBUR NEGATIVE 06/10/2020 2208   BILIRUBINUR NEGATIVE 06/10/2020 2208   KETONESUR 20 (A) 06/10/2020 2208   PROTEINUR 30 (A) 06/10/2020 2208   NITRITE NEGATIVE 06/10/2020 2208   LEUKOCYTESUR TRACE (A) 06/10/2020 2208    Radiological Exams on Admission: DG Chest Port 1 View  Result Date: 01/12/2021 CLINICAL DATA:  Shortness of breath EXAM: PORTABLE CHEST 1 VIEW COMPARISON:  06/10/2020, 03/10/2020, 02/19/2020, 12/22/2019, CT lung bases 01/27/2020 FINDINGS: Bandlike opacities at the left base similar compared to previous exams and could represent scarring. Underlying reticular interstitial opacity suspect for a component of underlying chronic lung disease. More acute appearing superimposed ground-glass opacities in the right thorax. Cardiomediastinal silhouette is enlarged. Tracheal deviation to the right. No pneumothorax. IMPRESSION: 1. Suspect that there is a component of underlying chronic lung disease and scarring. Probable acute superimposed ground-glass opacity in the right thorax which may be due to acute pneumonia. 2. Cardiomegaly 3. Tracheal deviation to the right suspicious for paratracheal mass or  enlarged thyroid (CT neck images from 2022 not available for direct comparison at the time of dictation ) Electronically Signed   By: Donavan Foil M.D.   On: 01/12/2021 19:02    EKG: Independently reviewed.  Suspected atrial flutter with RVR at 145 bpm.  Also could be sinus tach or SVT.  Left bundle branch block.  Assessment/Plan Principal Problem:   Sepsis due to pneumonia [facility]) Active Problems:   Chronic atrial fibrillation (HCC)   HTN (hypertension)  Sepsis secondary to pneumonia > Presents from facility with fever, cough, weakness. > Initially saturating in the 80s on room air improved on 2 L to the 90s. > Meets sepsis criteria with leukocytosis to 13, fever to 102.3, tachypnea, new oxygen requirement, elevated lactic acid.  Source of pneumonia based on chest x-ray. > Initial lactic acid was 3.6 and was 3.4 on repeat.  Patient has received total 3 L in the ED. - Monitor in progressive unit - Continue with ceftriaxone and azithromycin as started in the ED - Trend renal function electrolytes - Procalcitonin - Follow-up blood cultures - Trend fever curve and white count - Continue to trend lactic acid   AKI > Creatinine elevated to 1.38 from baseline around 1. > Has received multiple liters of IV fluids in the ED will monitor response - Avoid nephrotoxic agents - Trend renal function and electrolytes  Atrial fibrillation > History of atrial fibrillation.  Appear to have episode of possible a flutter with RVR in the ED.  Morphology could have also been consistent with sinus tach or SVT.  This responded to IV fluid bolus.  Appears to be in sinus rhythm now. - Continue home Eliquis, will need pharmacy to confirm medications as he did not arrive with any paperwork that I was able to find from facility.  Hypertension -Hold home antihypertensives considering he has some low normal blood pressures in the ED and has sepsis  Tracheal deviation History of gastrostomy and tracheostomy >  Gastrostomy tube recently removed > Tracheostomy was removed sometime ago. > Noted to have some tracheal deviation on chest x-ray unclear if this could be related to his prior tracheostomy.  Does report some difficulty swallowing at times. -  Thyroid ultrasound for further evaluation will   DVT prophylaxis: Eliquis Code Status:   Full Family Communication:  Attempted to reach patient's daughter by phone, however there is no answer. Disposition Plan:   Patient is from:  Nursing facility  Anticipated DC to:  Same as above  Anticipated DC date:  2 to 4 days  Anticipated DC barriers: None  Consults called:  None  Admission status:  Inpatient, progressive   Severity of Illness: The appropriate patient status for this patient is INPATIENT. Inpatient status is judged to be reasonable and necessary in order to provide the required intensity of service to ensure the patient's safety. The patient's presenting symptoms, physical exam findings, and initial radiographic and laboratory data in the context of their chronic comorbidities is felt to place them at high risk for further clinical deterioration. Furthermore, it is not anticipated that the patient will be medically stable for discharge from the hospital within 2 midnights of admission.   * I certify that at the point of admission it is my clinical judgment that the patient will require inpatient hospital care spanning beyond 2 midnights from the point of admission due to high intensity of service, high risk for further deterioration and high frequency of surveillance required.Marcelyn Bruins MD [facility]  How to contact the [facility] Attending or Consulting provider [place] or covering provider during after hours [place], for this patient?   Check the care team in [facility] and look for a) attending/consulting [facility] provider listed and b) the [facility] team listed Log into [email or web address] and use [place]'s universal password to access. If you do  not have the password, please contact the hospital operator. Locate the [facility] provider you are looking for under [facility] and page to a number that you can be directly reached. If you still have difficulty reaching the provider, please page the [facility] (Director on Call) for the Hospitalists listed on amion for assistance.  01/13/2021, 12:09 AM

## 2021-01-13 ENCOUNTER — Inpatient Hospital Stay (HOSPITAL_COMMUNITY): Payer: Medicare Other

## 2021-01-13 DIAGNOSIS — Z8616 Personal history of COVID-19: Secondary | ICD-10-CM | POA: Diagnosis not present

## 2021-01-13 DIAGNOSIS — Z20822 Contact with and (suspected) exposure to covid-19: Secondary | ICD-10-CM | POA: Diagnosis present

## 2021-01-13 DIAGNOSIS — Z7901 Long term (current) use of anticoagulants: Secondary | ICD-10-CM | POA: Diagnosis not present

## 2021-01-13 DIAGNOSIS — N179 Acute kidney failure, unspecified: Secondary | ICD-10-CM

## 2021-01-13 DIAGNOSIS — Z86718 Personal history of other venous thrombosis and embolism: Secondary | ICD-10-CM | POA: Diagnosis not present

## 2021-01-13 DIAGNOSIS — J9601 Acute respiratory failure with hypoxia: Secondary | ICD-10-CM | POA: Diagnosis not present

## 2021-01-13 DIAGNOSIS — I5022 Chronic systolic (congestive) heart failure: Secondary | ICD-10-CM | POA: Diagnosis present

## 2021-01-13 DIAGNOSIS — Z8674 Personal history of sudden cardiac arrest: Secondary | ICD-10-CM | POA: Diagnosis not present

## 2021-01-13 DIAGNOSIS — E041 Nontoxic single thyroid nodule: Secondary | ICD-10-CM | POA: Diagnosis present

## 2021-01-13 DIAGNOSIS — A419 Sepsis, unspecified organism: Secondary | ICD-10-CM | POA: Diagnosis not present

## 2021-01-13 DIAGNOSIS — I5030 Unspecified diastolic (congestive) heart failure: Secondary | ICD-10-CM | POA: Diagnosis not present

## 2021-01-13 DIAGNOSIS — I10 Essential (primary) hypertension: Secondary | ICD-10-CM

## 2021-01-13 DIAGNOSIS — R509 Fever, unspecified: Secondary | ICD-10-CM | POA: Diagnosis present

## 2021-01-13 DIAGNOSIS — J189 Pneumonia, unspecified organism: Secondary | ICD-10-CM | POA: Diagnosis not present

## 2021-01-13 DIAGNOSIS — J159 Unspecified bacterial pneumonia: Secondary | ICD-10-CM | POA: Diagnosis present

## 2021-01-13 DIAGNOSIS — I440 Atrioventricular block, first degree: Secondary | ICD-10-CM | POA: Diagnosis present

## 2021-01-13 DIAGNOSIS — I110 Hypertensive heart disease with heart failure: Secondary | ICD-10-CM | POA: Diagnosis present

## 2021-01-13 DIAGNOSIS — Z79899 Other long term (current) drug therapy: Secondary | ICD-10-CM | POA: Diagnosis not present

## 2021-01-13 DIAGNOSIS — I482 Chronic atrial fibrillation, unspecified: Secondary | ICD-10-CM | POA: Diagnosis not present

## 2021-01-13 DIAGNOSIS — Z87891 Personal history of nicotine dependence: Secondary | ICD-10-CM | POA: Diagnosis not present

## 2021-01-13 DIAGNOSIS — R739 Hyperglycemia, unspecified: Secondary | ICD-10-CM | POA: Diagnosis present

## 2021-01-13 DIAGNOSIS — Z931 Gastrostomy status: Secondary | ICD-10-CM | POA: Diagnosis not present

## 2021-01-13 DIAGNOSIS — Z794 Long term (current) use of insulin: Secondary | ICD-10-CM | POA: Diagnosis not present

## 2021-01-13 LAB — COMPREHENSIVE METABOLIC PANEL
ALT: 176 U/L — ABNORMAL HIGH (ref 0–44)
AST: 48 U/L — ABNORMAL HIGH (ref 15–41)
Albumin: 3.4 g/dL — ABNORMAL LOW (ref 3.5–5.0)
Alkaline Phosphatase: 115 U/L (ref 38–126)
Anion gap: 12 (ref 5–15)
BUN: 17 mg/dL (ref 8–23)
CO2: 24 mmol/L (ref 22–32)
Calcium: 8.7 mg/dL — ABNORMAL LOW (ref 8.9–10.3)
Chloride: 101 mmol/L (ref 98–111)
Creatinine, Ser: 1.04 mg/dL (ref 0.61–1.24)
GFR, Estimated: >60 mL/min (ref >60)
Glucose, Bld: 148 mg/dL — ABNORMAL HIGH (ref 70–99)
Potassium: 3.9 mmol/L (ref 3.5–5.1)
Sodium: 137 mmol/L (ref 135–145)
Total Bilirubin: 1 mg/dL (ref 0.3–1.2)
Total Protein: 6.8 g/dL (ref 6.5–8.1)

## 2021-01-13 LAB — CBC
HCT: 37.2 % — ABNORMAL LOW (ref 39.0–52.0)
Hemoglobin: 12.4 g/dL — ABNORMAL LOW (ref 13.0–17.0)
MCH: 30.9 pg (ref 26.0–34.0)
MCHC: 33.3 g/dL (ref 30.0–36.0)
MCV: 92.8 fL (ref 80.0–100.0)
Platelets: 212 10*3/uL (ref 150–400)
RBC: 4.01 MIL/uL — ABNORMAL LOW (ref 4.22–5.81)
RDW: 14.3 % (ref 11.5–15.5)
WBC: 17.8 10*3/uL — ABNORMAL HIGH (ref 4.0–10.5)
nRBC: 0 % (ref 0.0–0.2)

## 2021-01-13 LAB — BLOOD CULTURE ID PANEL (REFLEXED) - BCID2

## 2021-01-13 LAB — MAGNESIUM: Magnesium: 1.7 mg/dL (ref 1.7–2.4)

## 2021-01-13 LAB — CORTISOL-AM, BLOOD: Cortisol - AM: 20.9 ug/dL (ref 6.7–22.6)

## 2021-01-13 LAB — PROTIME-INR
INR: 1.6 — ABNORMAL HIGH (ref 0.8–1.2)
Prothrombin Time: 18.7 seconds — ABNORMAL HIGH (ref 11.4–15.2)

## 2021-01-13 LAB — PROCALCITONIN: Procalcitonin: 4.04 ng/mL

## 2021-01-13 MED ORDER — ACETAMINOPHEN 650 MG RE SUPP: 650.0000 mg | Freq: Four times a day (QID) | RECTAL | Status: DC | PRN

## 2021-01-13 MED ORDER — SODIUM CHLORIDE 0.9 % IV SOLN
500.0000 mg | INTRAVENOUS | Status: AC
  Administered 2021-01-13 – 2021-01-17 (×5): 500 mg via INTRAVENOUS
  Filled 2021-01-13 (×5): qty 5

## 2021-01-13 MED ORDER — VANCOMYCIN HCL 1250 MG/250ML IV SOLN
1250.0000 mg | Freq: Once | INTRAVENOUS | Status: AC
  Administered 2021-01-13: 1250 mg via INTRAVENOUS
  Filled 2021-01-13: qty 250

## 2021-01-13 MED ORDER — GUAIFENESIN-DM 100-10 MG/5ML PO SYRP
5.0000 mL | ORAL_SOLUTION | Freq: Four times a day (QID) | ORAL | Status: AC | PRN
  Administered 2021-01-13 (×2): 5 mL via ORAL
  Filled 2021-01-13 (×2): qty 10

## 2021-01-13 MED ORDER — SODIUM CHLORIDE 0.9 % IV SOLN
2.0000 g | INTRAVENOUS | Status: AC
  Administered 2021-01-13 – 2021-01-17 (×5): 2 g via INTRAVENOUS
  Filled 2021-01-13 (×6): qty 20

## 2021-01-13 MED ORDER — VANCOMYCIN HCL 1250 MG/250ML IV SOLN: 1250.0000 mg | INTRAVENOUS | Status: DC

## 2021-01-13 MED ORDER — POLYETHYLENE GLYCOL 3350 17 G PO PACK
17.0000 g | PACK | Freq: Every day | ORAL | Status: DC | PRN
  Administered 2021-01-14 – 2021-01-19 (×5): 17 g via ORAL
  Filled 2021-01-13 (×5): qty 1

## 2021-01-13 MED ORDER — ALBUTEROL SULFATE (2.5 MG/3ML) 0.083% IN NEBU
2.5000 mg | INHALATION_SOLUTION | Freq: Four times a day (QID) | RESPIRATORY_TRACT | Status: AC | PRN
  Administered 2021-01-13 – 2021-01-15 (×2): 2.5 mg via RESPIRATORY_TRACT
  Filled 2021-01-13 (×3): qty 3

## 2021-01-13 MED ORDER — ACETAMINOPHEN 325 MG PO TABS
650.0000 mg | ORAL_TABLET | Freq: Four times a day (QID) | ORAL | Status: DC | PRN
  Administered 2021-01-14 – 2021-01-22 (×4): 650 mg via ORAL
  Filled 2021-01-13 (×4): qty 2

## 2021-01-13 MED ORDER — APIXABAN 5 MG PO TABS
5.0000 mg | ORAL_TABLET | Freq: Two times a day (BID) | ORAL | Status: DC
  Administered 2021-01-13 – 2021-01-23 (×22): 5 mg via ORAL
  Filled 2021-01-13 (×22): qty 1

## 2021-01-13 MED ORDER — SODIUM CHLORIDE 0.9% FLUSH
3.0000 mL | Freq: Two times a day (BID) | INTRAVENOUS | Status: DC
  Administered 2021-01-13 – 2021-01-22 (×16): 3 mL via INTRAVENOUS

## 2021-01-13 NOTE — ED Notes (Signed)
Lunch provided.

## 2021-01-13 NOTE — Plan of Care (Signed)
New admission, 1437.  Sepsis, PNA + Blood cultures  IV Abx started in ER.   Problem: Education: Goal: Knowledge of General Education information will improve Description: Including pain rating scale, medication(s)/side effects and non-pharmacologic comfort measures Outcome: Progressing   Problem: Health Behavior/Discharge Planning: Goal: Ability to manage health-related needs will improve Outcome: Progressing   Problem: Clinical Measurements: Goal: Ability to maintain clinical measurements within normal limits will improve Outcome: Progressing Goal: Will remain free from infection Outcome: Progressing Goal: Diagnostic test results will improve Outcome: Progressing Goal: Respiratory complications will improve Outcome: Progressing Goal: Cardiovascular complication will be avoided Outcome: Progressing   Problem: Activity: Goal: Risk for activity intolerance will decrease Outcome: Progressing   Problem: Nutrition: Goal: Adequate nutrition will be maintained Outcome: Progressing   Problem: Coping: Goal: Level of anxiety will decrease Outcome: Progressing   Problem: Elimination: Goal: Will not experience complications related to bowel motility Outcome: Progressing Goal: Will not experience complications related to urinary retention Outcome: Progressing   Problem: Pain Managment: Goal: General experience of comfort will improve Outcome: Progressing   Problem: Safety: Goal: Ability to remain free from injury will improve Outcome: Progressing   Problem: Skin Integrity: Goal: Risk for impaired skin integrity will decrease Outcome: Progressing   Problem: Fluid Volume: Goal: Hemodynamic stability will improve Outcome: Progressing   Problem: Clinical Measurements: Goal: Diagnostic test results will improve Outcome: Progressing Goal: Signs and symptoms of infection will decrease Outcome: Progressing   Problem: Respiratory: Goal: Ability to maintain adequate  ventilation will improve Outcome: Progressing

## 2021-01-13 NOTE — ED Notes (Signed)
Pt attempted to ambulate to restroom, but gait unsteady. Pt assisted to restroom via wheelchair.

## 2021-01-13 NOTE — Progress Notes (Signed)
7pm-Pt appears to be confused to situation and time, impulsive at times,redirectable. VS are stable. Antibiotics initiated. Reorientation done frequently, NP updated.

## 2021-01-13 NOTE — Progress Notes (Signed)
Pharmacy Antibiotic Note  Robert Carpenter is a 88 y.o. male admitted on 01/12/2021 with  possible bacteremia .  Patient with 1 set so 2 bottles with GPC identified as staph epi per BCID so could be contaminant but pt also with trach hx so could be real infection as well. Pharmacy has been consulted for vancomycin dosing.  Plan: Vanc 1250mg  IV q24 - goal AUC 400-550 Md to reorder blood cultures    Temp (24hrs), Avg:100 F (37.8 C), Min:99.1 F (37.3 C), Max:102.3 F (39.1 C)  Recent Labs  Lab 01/12/21 2000 01/12/21 2137 01/13/21 0420  WBC 13.0*  --  17.8*  CREATININE 1.38*  --  1.04  LATICACIDVEN 3.6* 3.4*  --     CrCl cannot be calculated (Unknown ideal weight.).    No Known Allergies   Thank you for allowing pharmacy to be a part of this patients care.  01/15/21 01/13/2021 6:05 PM

## 2021-01-13 NOTE — Progress Notes (Signed)
PROGRESS NOTE  Robert Carpenter:[id number] DOB: 23-Dec-1933 DOA: 01/12/2021 PCP: Rosaria Ferries, MD  Brief History   COBI DELPH is a 88 y.o. male with medical history significant of atrial fibrillation, hypertension, history of DVT, history of cardiac arrest, history of gastrostomy and tracheostomy who presents from facility with concern for 1 day of fever, cough and increasing weakness.  As above patient presents from his facility where he lives with fever cough and weakness for 1 day.  EMS was called and noted to be saturating 85% on room air improved to 96% on nasal cannula.  There has reportedly been a respiratory illness going around at the facility possibly flu.  Patient reports he has had some ongoing medical issues ever since he was admitted with COVID and had a cardiac arrest last year.  Patient denies fever, chills, chest pain, abdominal pain, constipation, diarrhea, nausea, vomiting.    ED Course: Vital signs in the ED significant for blood pressure in the 349Z to 791T systolic, respiratory rate in the teens to 20s on 2 L to maintain oxygen, temperature of 102.3, did have episode of significant tachycardia into the 140s.  Lab work-up shows CMP with sodium 134, creatinine elevated 1.38 from baseline of 1, glucose 182, calcium 8.4, AST 65, ALT 201, alk phos 134.  CBC with leukocytosis to 13.  Lactic acid initially elevated at 3.6 and then improving to 3.4 and repeat.  Respiratory panel for flu and COVID-negative.  Chest x-ray showed chronic lung disease with superimposed groundglass opacity on the right suspicious for pneumonia, cardiomegaly, tracheal deviation to the right suspicious for possible peritracheal mass or enlarged thyroid no prior to compare available to radiologist at the time of the read.   While in the ED patient developed what appears to be episode of atrial flutter with RVR considering his history of A. fib rate in the 140s per EKG as below.  Patient  responded to fluid bolus.   Patient additionally received azithromycin, ceftriaxone, Tylenol, above 3 L fluid bolus and DuoNeb in the ED.  [facility] was consulted to admit the patient for further evaluation and treatment.  Consultants    Procedures  None  Antibiotics   Anti-infectives (From admission, onward)    Start     Dose/Rate Route Frequency Ordered Stop   01/13/21 2200  cefTRIAXone (ROCEPHIN) 2 g in sodium chloride 0.9 % 100 mL IVPB        2 g 200 mL/hr over 30 Minutes Intravenous Every 24 hours 01/13/21 0007 01/18/21 2159   01/13/21 2200  azithromycin (ZITHROMAX) 500 mg in sodium chloride 0.9 % 250 mL IVPB        500 mg 250 mL/hr over 60 Minutes Intravenous Every 24 hours 01/13/21 0007 01/18/21 2159   01/12/21 2330  cefTRIAXone (ROCEPHIN) 1 g in sodium chloride 0.9 % 100 mL IVPB        1 g 200 mL/hr over 30 Minutes Intravenous  Once 01/12/21 2323 01/13/21 0147   01/12/21 2330  azithromycin (ZITHROMAX) 500 mg in sodium chloride 0.9 % 250 mL IVPB        500 mg 250 mL/hr over 60 Minutes Intravenous  Once 01/12/21 2323 01/13/21 0147      Subjective  The patient is resting comfortably. No new complaints.   Objective   Vitals:  Vitals:   01/13/21 1300 01/13/21 1500  BP: (!) 146/70 (!) 153/75  Pulse: 70 75  Resp: 18 18  Temp:    SpO2: 93% 94%  Exam:  Constitutional:  The patient is awake, alert, and oriented x 3. No acute distress. Respiratory:  No increased work of breathing. No wheezes, rales, or rhonchi No tactile fremitus Cardiovascular:  Regular rate and rhythm No murmurs, ectopy, or gallups. No lateral PMI. No thrills. Abdomen:  Abdomen is soft, non-tender, non-distended No hernias, masses, or organomegaly Normoactive bowel sounds.  Musculoskeletal:  No cyanosis, clubbing, or edema Skin:  No rashes, lesions, ulcers palpation of skin: no induration or nodules Neurologic:  CN 2-12 intact Sensation all 4 extremities intact Psychiatric:  Mental  status Mood, affect appropriate Orientation to person, place, time  judgment and insight appear intact   I have personally reviewed the following:   Today's Data  Vitals  Lab Data  CBC CMP  Micro Data  Blood culture x 2 pending  Imaging  CXR  Cardiology Data  EKG  Other Data    Scheduled Meds:  apixaban  5 mg Oral BID   sodium chloride flush  3 mL Intravenous Q12H   Continuous Infusions:  azithromycin     cefTRIAXone (ROCEPHIN)  IV      Principal Problem:   Sepsis due to pneumonia [facility]) Active Problems:   Chronic atrial fibrillation (HCC)   HTN (hypertension)   AKI (acute kidney injury) ([place])   LOS: 0 days    A & P  Sepsis secondary to pneumonia > Presents from facility with fever, cough, weakness. > Initially saturating in the 80s on room air improved on 2 L to the 90s. > Meets sepsis criteria with leukocytosis to 13, fever to 102.3, tachypnea, new oxygen requirement, elevated lactic acid.  Source of pneumonia based on chest x-ray. > Initial lactic acid was 3.6 and was 3.4 on repeat.  Patient has received total 3 L in the ED. - Monitor in progressive unit - Continue with ceftriaxone and azithromycin as started in the ED - Trend renal function electrolytes - Procalcitonin is 4.04 - Follow-up blood cultures - Trend fever curve and white count - Continue to trend lactic acid    AKI > Creatinine elevated to 1.38 from baseline around 1 at admission > Creatinine today 1.04. > Has received multiple liters of IV fluids in the ED will monitor response - Avoid nephrotoxic agents and hypotension - Trend renal function and electrolytes and volume status   Atrial fibrillation > History of atrial fibrillation.  Appear to have episode of possible a flutter with RVR in the ED.  Morphology could have also been consistent with sinus tach or SVT.  This responded to IV fluid bolus.  Appears to be in sinus rhythm now. - Continue home Eliquis, will need pharmacy to  confirm medications as he did not arrive with any paperwork that I was able to find from facility.   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 > Noted to have some tracheal deviation on chest x-ray unclear if this could be related to his prior tracheostomy.  Does report some difficulty swallowing at times. - Thyroid ultrasound for further evaluation will    DVT prophylaxis:      Eliquis Code Status:              Full Family Communication:       Attempted to reach patient's daughter by phone, however there is no answer. Disposition Plan:  Patient is from:                        Nursing facility             Anticipated DC to:                   Same as above             Anticipated DC date:               2 to 4 days             Anticipated DC barriers:         None      Linkyn Gobin, DO [facility] Direct contact: see [email or web address]  7PM-7AM contact night coverage as above 01/13/2021, 4:13 PM  LOS: 0 days

## 2021-01-13 NOTE — ED Notes (Addendum)
Pt resting in stretcher. Offers no complaints. Pending admission bed at this time.

## 2021-01-14 ENCOUNTER — Inpatient Hospital Stay (HOSPITAL_COMMUNITY): Payer: Medicare Other

## 2021-01-14 LAB — BASIC METABOLIC PANEL
Anion gap: 9 (ref 5–15)
BUN: 14 mg/dL (ref 8–23)
CO2: 26 mmol/L (ref 22–32)
Calcium: 8.9 mg/dL (ref 8.9–10.3)
Chloride: 97 mmol/L — ABNORMAL LOW (ref 98–111)
Creatinine, Ser: 1.03 mg/dL (ref 0.61–1.24)
GFR, Estimated: >60 mL/min (ref >60)
Glucose, Bld: 165 mg/dL — ABNORMAL HIGH (ref 70–99)
Potassium: 3.7 mmol/L (ref 3.5–5.1)
Sodium: 132 mmol/L — ABNORMAL LOW (ref 135–145)

## 2021-01-14 LAB — CBC WITH DIFFERENTIAL/PLATELET
Abs Immature Granulocytes: 0.15 10*3/uL — ABNORMAL HIGH (ref 0.00–0.07)
Basophils Absolute: 0 10*3/uL (ref 0.0–0.1)
Basophils Relative: 0 %
Eosinophils Absolute: 0 10*3/uL (ref 0.0–0.5)
Eosinophils Relative: 0 %
HCT: 37.3 % — ABNORMAL LOW (ref 39.0–52.0)
Hemoglobin: 12.4 g/dL — ABNORMAL LOW (ref 13.0–17.0)
Immature Granulocytes: 1 %
Lymphocytes Relative: 10 %
Lymphs Abs: 1.5 10*3/uL (ref 0.7–4.0)
MCH: 31 pg (ref 26.0–34.0)
MCHC: 33.2 g/dL (ref 30.0–36.0)
MCV: 93.3 fL (ref 80.0–100.0)
Monocytes Absolute: 1.1 10*3/uL — ABNORMAL HIGH (ref 0.1–1.0)
Monocytes Relative: 7 %
Neutro Abs: 12.2 10*3/uL — ABNORMAL HIGH (ref 1.7–7.7)
Neutrophils Relative %: 82 %
Platelets: 209 10*3/uL (ref 150–400)
RBC: 4 MIL/uL — ABNORMAL LOW (ref 4.22–5.81)
RDW: 14.2 % (ref 11.5–15.5)
WBC: 15.1 10*3/uL — ABNORMAL HIGH (ref 4.0–10.5)
nRBC: 0 % (ref 0.0–0.2)

## 2021-01-14 LAB — BLOOD GAS, ARTERIAL
Acid-Base Excess: 0.8 mmol/L (ref 0.0–2.0)
Bicarbonate: 25.3 mmol/L (ref 20.0–28.0)
Drawn by: 22052
FIO2: 28
O2 Saturation: 86.7 %
Patient temperature: 98.6
pCO2 arterial: 42.3 mmHg (ref 32.0–48.0)
pH, Arterial: 7.393 (ref 7.350–7.450)
pO2, Arterial: 54 mmHg — ABNORMAL LOW (ref 83.0–108.0)

## 2021-01-14 LAB — LACTIC ACID, PLASMA
Lactic Acid, Venous: 2.1 mmol/L (ref 0.5–1.9)
Lactic Acid, Venous: 1.9 mmol/L (ref 0.5–1.9)

## 2021-01-14 LAB — CREATININE, SERUM
Creatinine, Ser: 0.97 mg/dL (ref 0.61–1.24)
GFR, Estimated: >60 mL/min (ref >60)

## 2021-01-14 MED ORDER — ALBUTEROL SULFATE (2.5 MG/3ML) 0.083% IN NEBU
2.5000 mg | INHALATION_SOLUTION | Freq: Four times a day (QID) | RESPIRATORY_TRACT | Status: DC
  Administered 2021-01-14 (×3): 2.5 mg via RESPIRATORY_TRACT
  Filled 2021-01-14 (×4): qty 3

## 2021-01-14 MED ORDER — ADULT MULTIVITAMIN W/MINERALS CH
1.0000 | ORAL_TABLET | Freq: Every day | ORAL | Status: DC
  Administered 2021-01-14 – 2021-01-23 (×10): 1 via ORAL
  Filled 2021-01-14 (×10): qty 1

## 2021-01-14 MED ORDER — ORAL CARE MOUTH RINSE
15.0000 mL | Freq: Two times a day (BID) | OROMUCOSAL | Status: DC
  Administered 2021-01-14 – 2021-01-22 (×16): 15 mL via OROMUCOSAL

## 2021-01-14 MED ORDER — IBUPROFEN 200 MG PO TABS
400.0000 mg | ORAL_TABLET | ORAL | Status: DC | PRN
  Administered 2021-01-14 – 2021-01-18 (×6): 400 mg via ORAL
  Filled 2021-01-14 (×6): qty 2

## 2021-01-14 MED ORDER — SENNOSIDES-DOCUSATE SODIUM 8.6-50 MG PO TABS
2.0000 | ORAL_TABLET | Freq: Every day | ORAL | Status: DC
  Administered 2021-01-14 – 2021-01-22 (×9): 2 via ORAL
  Filled 2021-01-14 (×9): qty 2

## 2021-01-14 MED ORDER — SODIUM CHLORIDE 0.9 % IV SOLN: INTRAVENOUS | Status: DC

## 2021-01-14 MED ORDER — APIXABAN 5 MG PO TABS: 5.0000 mg | ORAL_TABLET | Freq: Two times a day (BID) | ORAL | Status: DC

## 2021-01-14 MED ORDER — MIRTAZAPINE 15 MG PO TABS
7.5000 mg | ORAL_TABLET | Freq: Every day | ORAL | Status: DC
  Administered 2021-01-14 – 2021-01-22 (×7): 7.5 mg via ORAL
  Filled 2021-01-14 (×9): qty 1

## 2021-01-14 MED ORDER — VANCOMYCIN HCL 1500 MG/300ML IV SOLN
1500.0000 mg | INTRAVENOUS | Status: DC
  Administered 2021-01-14 – 2021-01-15 (×2): 1500 mg via INTRAVENOUS
  Filled 2021-01-14 (×3): qty 300

## 2021-01-14 MED ORDER — DICLOFENAC SODIUM 1 % EX GEL
2.0000 g | Freq: Four times a day (QID) | CUTANEOUS | Status: DC
  Administered 2021-01-14 – 2021-01-23 (×17): 2 g via TOPICAL
  Filled 2021-01-14: qty 100

## 2021-01-14 MED ORDER — FUROSEMIDE 10 MG/ML IJ SOLN
20.0000 mg | Freq: Once | INTRAMUSCULAR | Status: AC
  Administered 2021-01-14: 20 mg via INTRAVENOUS
  Filled 2021-01-14: qty 2

## 2021-01-14 MED ORDER — SERTRALINE HCL 50 MG PO TABS
50.0000 mg | ORAL_TABLET | Freq: Every day | ORAL | Status: DC
  Administered 2021-01-14 – 2021-01-23 (×10): 50 mg via ORAL
  Filled 2021-01-14 (×10): qty 1

## 2021-01-14 MED ORDER — GUAIFENESIN ER 600 MG PO TB12
1200.0000 mg | ORAL_TABLET | Freq: Two times a day (BID) | ORAL | Status: DC
  Administered 2021-01-14 – 2021-01-23 (×19): 1200 mg via ORAL
  Filled 2021-01-14 (×19): qty 2

## 2021-01-14 NOTE — Progress Notes (Addendum)
Rhonchi auscultated throughout lung fields. With exertion, patient c/o SOB/tachypnea and congestion.    Per patient, "Ever since I smoked that pipe, I can breathe better" - referring to nebulizer breathing treatment.    Patient transferred with steadying assist to chair, remained OOB for 2 hours.    SpO2 93-100% on 2 L/min.  Patient refuses to remove soils pants and brief.  Both are dry.  Skin intact underneath brief.  External condom catheter in place.  Bradd Burner, RN

## 2021-01-14 NOTE — Progress Notes (Signed)
°   01/14/21 1506  Assess: MEWS Score  Temp (!) 101.3 F (38.5 C)  BP (!) 154/69  Pulse Rate 94  Resp (!) 36  Level of Consciousness Alert  SpO2 99 %  O2 Device Nasal Cannula  O2 Flow Rate (L/min) 2 L/min  Assess: MEWS Score  MEWS Temp 1  MEWS Systolic 0  MEWS Pulse 0  MEWS RR 3  MEWS LOC 0  MEWS Score 4  MEWS Score Color Red  Assess: if the MEWS score is Yellow or Red  Were vital signs taken at a resting state? Yes  Focused Assessment Change from prior assessment (see assessment flowsheet)  Does the patient meet 2 or more of the SIRS criteria? Yes  Does the patient have a confirmed or suspected source of infection? Yes  Provider and Rapid Response Notified? Yes  MEWS guidelines implemented *See Row Information* Yes  Treat  MEWS Interventions Escalated (See documentation below)  Take Vital Signs  Increase Vital Sign Frequency  Red: Q 1hr X 4 then Q 4hr X 4, if remains red, continue Q 4hrs  Escalate  MEWS: Escalate Red: discuss with charge nurse/RN and provider, consider discussing with RRT  Notify: Charge Nurse/RN  Name of Charge Nurse/RN Notified Delford Field, RN  Date Charge Nurse/RN Notified 01/14/21  Time Charge Nurse/RN Notified 1507  Notify: Provider  Provider Name/Title Fran Lowes, DO  Date Provider Notified 01/14/21  Time Provider Notified 1508  Notification Type Page  Notification Reason Change in status  Provider response See new orders  Date of Provider Response 01/14/21  Notify: Rapid Response  Name of Rapid Response RN Notified Donnita Falls, RN  Date Rapid Response Notified 01/14/21  Assess: SIRS CRITERIA  SIRS Temperature  1  SIRS Pulse 1  SIRS Respirations  1  SIRS WBC 1  SIRS Score Sum  4

## 2021-01-14 NOTE — Progress Notes (Signed)
Patient received acetaminophen at 1500 for fever of 101.5 (rectal). Patient received ibuprofen at 1600 for fever of 101.6 (rectal). Rectal temp at 1700 102.  Ice packs applied, MD aware.  With any exertion (e.g. sliding himself up in the bed), patient becomes tachypneic, WOB increases.  SpO2 remains in high 90s.  Patient forgetful, frequently trying to get out of bed.  Staff reorienting patient, encouraging him to stay in bed to "help with his breathing."  Encouraging patient to use flutter valve per RR RN recommendation.  Bradd Burner, RN

## 2021-01-14 NOTE — Progress Notes (Signed)
Pharmacy Antibiotic Note  Robert Carpenter is a 88 y.o. male admitted on 01/12/2021 with  possible bacteremia .  Patient with 1 set so 2 bottles with GPC identified as staph epi per BCID so could be contaminant but pt also with trach hx so could be real infection as well. Pharmacy has been consulted for vancomycin dosing.  Plan: Vanc 1500mg   IV q24 - goal AUC 400-550 Est AUC 487 using SCr 1.03 Md to reorder blood cultures    Height: 6\' 2"  (188 cm) Weight: 77.7 kg (171 lb 6.4 oz) IBW/kg (Calculated) : 82.2  Temp (24hrs), Avg:98.9 F (37.2 C), Min:98.2 F (36.8 C), Max:99.8 F (37.7 C)  Recent Labs  Lab 01/12/21 2000 01/12/21 2137 01/13/21 0420 01/14/21 0050 01/14/21 0419 01/14/21 1100  WBC 13.0*  --  17.8*  --   --  15.1*  CREATININE 1.38*  --  1.04  --  0.97 1.03  LATICACIDVEN 3.6* 3.4*  --  2.1* 1.9  --      Estimated Creatinine Clearance: 57.6 mL/min (by C-G formula based on SCr of 1.03 mg/dL).    No Known Allergies  Antimicrobials: 1/13 azith> 1/13 CTX>> 1/13 Vanc >>  Cultures:  1/12 BCx: 2/4 (1 set) GPC - staph epi per BCID 1/13 BCx:   Thank you for allowing pharmacy to be a part of this patients care.  3/12 PharmD, BCPS Clinical Pharmacist [facility] main pharmacy [phone] 01/14/2021 12:53 PM

## 2021-01-14 NOTE — Progress Notes (Addendum)
PROGRESS NOTE  Robert Carpenter YJE:[id number] DOB: 10/22/1933 DOA: 01/12/2021 PCP: Rosaria Ferries, MD  Brief History   Robert Carpenter is a 88 y.o. male with medical history significant of atrial fibrillation, hypertension, history of DVT, history of cardiac arrest, history of gastrostomy and tracheostomy who presents from facility with concern for 1 day of fever, cough and increasing weakness.  As above patient presents from his facility where he lives with fever cough and weakness for 1 day.  EMS was called and noted to be saturating 85% on room air improved to 96% on nasal cannula.  There has reportedly been a respiratory illness going around at the facility possibly flu.  Patient reports he has had some ongoing medical issues ever since he was admitted with COVID and had a cardiac arrest last year.  Patient denies fever, chills, chest pain, abdominal pain, constipation, diarrhea, nausea, vomiting.    ED Course: Vital signs in the ED significant for blood pressure in the 637C to 588F systolic, respiratory rate in the teens to 20s on 2 L to maintain oxygen, temperature of 102.3, did have episode of significant tachycardia into the 140s.  Lab work-up shows CMP with sodium 134, creatinine elevated 1.38 from baseline of 1, glucose 182, calcium 8.4, AST 65, ALT 201, alk phos 134.  CBC with leukocytosis to 13.  Lactic acid initially elevated at 3.6 and then improving to 3.4 and repeat.  Respiratory panel for flu and COVID-negative.  Chest x-ray showed chronic lung disease with superimposed groundglass opacity on the right suspicious for pneumonia, cardiomegaly, tracheal deviation to the right suspicious for possible peritracheal mass or enlarged thyroid no prior to compare available to radiologist at the time of the read.   While in the ED patient developed what appears to be episode of atrial flutter with RVR considering his history of A. fib rate in the 140s per EKG as below.  Patient  responded to fluid bolus.   Patient additionally received azithromycin, ceftriaxone, Tylenol, above 3 L fluid bolus and DuoNeb in the ED.  [facility] was consulted to admit the patient for further evaluation and treatment.  Today the patient has had fevers, increased agitation, and confusion. Fevers have been resistant to treatment with tylenol and ibuprofen. Ice packs are being used.   CXR demonstrated patchy infiltrates vs pulmonary edema.  Consultants    Procedures  None  Antibiotics   Anti-infectives (From admission, onward)    Start     Dose/Rate Route Frequency Ordered Stop   01/14/21 2000  vancomycin (VANCOREADY) IVPB 1250 mg/250 mL  Status:  Discontinued        1,250 mg 166.7 mL/hr over 90 Minutes Intravenous Every 24 hours 01/13/21 1819 01/14/21 1254   01/14/21 1600  vancomycin (VANCOREADY) IVPB 1500 mg/300 mL        1,500 mg 150 mL/hr over 120 Minutes Intravenous Every 24 hours 01/14/21 1254     01/13/21 2200  cefTRIAXone (ROCEPHIN) 2 g in sodium chloride 0.9 % 100 mL IVPB        2 g 200 mL/hr over 30 Minutes Intravenous Every 24 hours 01/13/21 0007 01/18/21 2159   01/13/21 2200  azithromycin (ZITHROMAX) 500 mg in sodium chloride 0.9 % 250 mL IVPB        500 mg 250 mL/hr over 60 Minutes Intravenous Every 24 hours 01/13/21 0007 01/18/21 2159   01/13/21 1830  vancomycin (VANCOREADY) IVPB 1250 mg/250 mL        1,250 mg 166.7 mL/hr  over 90 Minutes Intravenous  Once 01/13/21 1819 01/13/21 2200   01/12/21 2330  cefTRIAXone (ROCEPHIN) 1 g in sodium chloride 0.9 % 100 mL IVPB        1 g 200 mL/hr over 30 Minutes Intravenous  Once 01/12/21 2323 01/13/21 0147   01/12/21 2330  azithromycin (ZITHROMAX) 500 mg in sodium chloride 0.9 % 250 mL IVPB        500 mg 250 mL/hr over 60 Minutes Intravenous  Once 01/12/21 2323 01/13/21 0147      Subjective  The patient is resting comfortably. No new complaints.   Objective   Vitals:  Vitals:   01/14/21 1822 01/14/21 1905  BP:  140/89 138/76  Pulse: (!) 121 80  Resp: (!) 36 (!) 36  Temp: (!) 101 F (38.3 C) (!) 100.4 F (38 C)  SpO2: 99% 98%    Exam:  Constitutional:  The patient is awake, alert, and oriented x 3. No acute distress. Respiratory:  No increased work of breathing. No wheezes, rales, or rhonchi No tactile fremitus Cardiovascular:  Regular rate and rhythm No murmurs, ectopy, or gallups. No lateral PMI. No thrills. Abdomen:  Abdomen is soft, non-tender, non-distended No hernias, masses, or organomegaly Normoactive bowel sounds.  Musculoskeletal:  No cyanosis, clubbing, or edema Skin:  No rashes, lesions, ulcers palpation of skin: no induration or nodules Neurologic:  CN 2-12 intact Sensation all 4 extremities intact Psychiatric:  Mental status Mood, affect appropriate Orientation to person, place, time  judgment and insight appear intact   I have personally reviewed the following:   Today's Data  Vitals  Lab Data  CBC CMP  Micro Data  Blood culture x 2 pending 3rd set of blood cultures  Imaging  CXR  Cardiology Data  EKG  Other Data    Scheduled Meds:  albuterol  2.5 mg Nebulization Q6H   apixaban  5 mg Oral BID   diclofenac Sodium  2 g Topical QID   guaiFENesin  1,200 mg Oral BID   mouth rinse  15 mL Mouth Rinse BID   mirtazapine  7.5 mg Oral QHS   multivitamin with minerals  1 tablet Oral Daily   senna-docusate  2 tablet Oral QHS   sertraline  50 mg Oral Daily   sodium chloride flush  3 mL Intravenous Q12H   Continuous Infusions:  sodium chloride 10 mL/hr at 01/14/21 1652   azithromycin 500 mg (01/13/21 2211)   cefTRIAXone (ROCEPHIN)  IV 2 g (01/13/21 2140)   vancomycin 1,500 mg (01/14/21 1631)    Principal Problem:   Sepsis due to pneumonia [facility]) Active Problems:   Chronic atrial fibrillation (HCC)   HTN (hypertension)   AKI (acute kidney injury) ([place])   LOS: 1 day    A & P  Sepsis secondary to pneumonia > Presents from facility with  fever, cough, weakness. > Initially saturating in the 80s on room air improved on 2 L to the 90s. > Meets sepsis criteria with leukocytosis to 13, fever to 102.3, tachypnea, new oxygen requirement, elevated lactic acid.  Source of pneumonia based on chest x-ray. > Initial lactic acid was 3.6 and was 3.4 on repeat.  Patient has received total 3 L in the ED. - Monitor in progressive unit - Continue with ceftriaxone and azithromycin as started in the ED - Trend renal function electrolytes - Procalcitonin is 4.04 - Follow-up blood cultures - Trend fever curve and white count - Continue to trend lactic acid   Pulmonary edema: Possible  pulmonary edema on CXR vs patchy opacities. Will treat with lasix 20 mg IV x 1. Monitor for response.  Fevers: Throughout the afternoon the patient has had fevers from 101.3 to 102. He has received tylenol and ibuprofen with only minimal improvement. Blood cultures repeated. It has come down somewhat with ice packs. With the fevers the patient has had tachypnea, confusion, and agitation.    AKI > Creatinine elevated to 1.38 from baseline around 1 at admission > Creatinine today 1.03. > Has received multiple liters of IV fluids in the ED will monitor response - Avoid nephrotoxic agents and hypotension - Trend renal function and electrolytes and volume status   Atrial fibrillation > History of atrial fibrillation.  Appear to have episode of possible a flutter with RVR in the ED.  Morphology could have also been consistent with sinus tach or SVT.  This responded to IV fluid bolus.  Appears to be in sinus rhythm now. - Continue home Eliquis, will need pharmacy to confirm medications as he did not arrive with any paperwork that I was able to find from facility.   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 > Noted to have some tracheal deviation on chest x-ray unclear if this could be related to his prior tracheostomy.  Does report some difficulty swallowing at times. - Thyroid ultrasound for further evaluation will   I have seen and examined this patient myself. I have spent 42 minutes in his evaluation and care.   DVT prophylaxis:      Eliquis Code Status:              Full Family Communication:       I have discussed the patient in detail with his son Shanon Brow. All questions have been answered to the best of my ability Disposition Plan:              Patient is from:                        Nursing facility             Anticipated DC to:                   Same as above             Anticipated DC date:               2 to 4 days             Anticipated DC barriers:         None      Leelah Hanna, DO [facility] Direct contact: see [email or web address]  7PM-7AM contact night coverage as above 01/14/2021, 7:30 PM  LOS: 0 days

## 2021-01-14 NOTE — Progress Notes (Addendum)
Patient's bed alarm sounded.  When RN got to patient, patient had already ambulated to bathroom.  Patient became increasingly restless, saying, "I can't breathe," and "I'm so tired."  Patient was escorted back to bed, but continued trying to get out of bed, repeating, "I'm so tired."  VS checked 15 minutes after returning to bed, RR 36, Temp rectal 101.5, SpO2 97%.  WOB remained high.  Tylenol administered.  RT called for scheduled breathing Tx.  MD notified.  Bradd Burner, RN

## 2021-01-14 NOTE — Significant Event (Signed)
Rapid Response Event Note   Reason for Call :  Respiratory distress  Initial Focused Assessment:  Pt upright in bed, labored breathing, monosyllabic. Oriented to person and place, disoriented to time/situation.  100% on 2L [place] 158/84 (105); RR 40; HR 96; 100 2L [place]    Interventions:  ABG Flutter valve 20 mg Lasix Ibuprofen  Plan of Care:  Continue to monitor fever and WOB. Bedside RN advised to call rapid response for any concerns   Event Summary:  Pt breathing more comfortably after use of flutter valve, copious secretions expectorated. HR 93; 100% 2L [place]; 128/68 (84); RR 36 MD Notified: Dr Benny Lennert Call Time: [place] Time: [id number] End Time: 1627  Cyndie Chime, RN

## 2021-01-15 ENCOUNTER — Inpatient Hospital Stay (HOSPITAL_COMMUNITY): Payer: Medicare Other

## 2021-01-15 LAB — CULTURE, BLOOD (ROUTINE X 2): Special Requests: ADEQUATE

## 2021-01-15 LAB — CREATININE, SERUM
Creatinine, Ser: 0.83 mg/dL (ref 0.61–1.24)
GFR, Estimated: >60 mL/min (ref >60)

## 2021-01-15 MED ORDER — ALBUTEROL SULFATE (2.5 MG/3ML) 0.083% IN NEBU
2.5000 mg | INHALATION_SOLUTION | Freq: Three times a day (TID) | RESPIRATORY_TRACT | Status: DC
  Administered 2021-01-15 – 2021-01-17 (×7): 2.5 mg via RESPIRATORY_TRACT
  Filled 2021-01-15 (×7): qty 3

## 2021-01-15 MED ORDER — HYDROXYZINE HCL 25 MG PO TABS
25.0000 mg | ORAL_TABLET | Freq: Once | ORAL | Status: DC
Start: 2021-01-16 — End: 2021-01-15

## 2021-01-15 MED ORDER — HYDROXYZINE HCL 10 MG PO TABS
10.0000 mg | ORAL_TABLET | Freq: Once | ORAL | Status: AC
  Administered 2021-01-16: 10 mg via ORAL
  Filled 2021-01-15: qty 1

## 2021-01-15 NOTE — Progress Notes (Signed)
PROGRESS NOTE  ADEYEMI Carpenter TKZ:[id number] DOB: [birth year] DOA: 01/12/2021 PCP: Rosaria Ferries, MD  Brief History   Robert Carpenter is a 88 y.o. male with medical history significant of atrial fibrillation, hypertension, history of DVT, history of cardiac arrest, history of gastrostomy and tracheostomy who presents from facility with concern for 1 day of fever, cough and increasing weakness.  As above patient presents from his facility where he lives with fever cough and weakness for 1 day.  EMS was called and noted to be saturating 85% on room air improved to 96% on nasal cannula.  There has reportedly been a respiratory illness going around at the facility possibly flu.  Patient reports he has had some ongoing medical issues ever since he was admitted with COVID and had a cardiac arrest last year.  Patient denies fever, chills, chest pain, abdominal pain, constipation, diarrhea, nausea, vomiting.    ED Course: Vital signs in the ED significant for blood pressure in the 573U to 202R systolic, respiratory rate in the teens to 20s on 2 L to maintain oxygen, temperature of 102.3, did have episode of significant tachycardia into the 140s.  Lab work-up shows CMP with sodium 134, creatinine elevated 1.38 from baseline of 1, glucose 182, calcium 8.4, AST 65, ALT 201, alk phos 134.  CBC with leukocytosis to 13.  Lactic acid initially elevated at 3.6 and then improving to 3.4 and repeat.  Respiratory panel for flu and COVID-negative.  Chest x-ray showed chronic lung disease with superimposed groundglass opacity on the right suspicious for pneumonia, cardiomegaly, tracheal deviation to the right suspicious for possible peritracheal mass or enlarged thyroid no prior to compare available to radiologist at the time of the read.   While in the ED patient developed what appears to be episode of atrial flutter with RVR considering his history of A. fib rate in the 140s per EKG as below.  Patient  responded to fluid bolus.   Patient additionally received azithromycin, ceftriaxone, Tylenol, above 3 L fluid bolus and DuoNeb in the ED.  [facility] was consulted to admit the patient for further evaluation and treatment.  On 01/14/2021 the patient has had fevers, increased agitation, and confusion. This appears to have resolved. CXR demonstrated patchy infiltrates vs pulmonary edema.  Consultants    Procedures  None  Antibiotics   Anti-infectives (From admission, onward)    Start     Dose/Rate Route Frequency Ordered Stop   01/14/21 2000  vancomycin (VANCOREADY) IVPB 1250 mg/250 mL  Status:  Discontinued        1,250 mg 166.7 mL/hr over 90 Minutes Intravenous Every 24 hours 01/13/21 1819 01/14/21 1254   01/14/21 1600  vancomycin (VANCOREADY) IVPB 1500 mg/300 mL        1,500 mg 150 mL/hr over 120 Minutes Intravenous Every 24 hours 01/14/21 1254     01/13/21 2200  cefTRIAXone (ROCEPHIN) 2 g in sodium chloride 0.9 % 100 mL IVPB        2 g 200 mL/hr over 30 Minutes Intravenous Every 24 hours 01/13/21 0007 01/18/21 2159   01/13/21 2200  azithromycin (ZITHROMAX) 500 mg in sodium chloride 0.9 % 250 mL IVPB        500 mg 250 mL/hr over 60 Minutes Intravenous Every 24 hours 01/13/21 0007 01/18/21 2159   01/13/21 1830  vancomycin (VANCOREADY) IVPB 1250 mg/250 mL        1,250 mg 166.7 mL/hr over 90 Minutes Intravenous  Once 01/13/21 1819 01/13/21 2200  01/12/21 2330  cefTRIAXone (ROCEPHIN) 1 g in sodium chloride 0.9 % 100 mL IVPB        1 g 200 mL/hr over 30 Minutes Intravenous  Once 01/12/21 2323 01/13/21 0147   01/12/21 2330  azithromycin (ZITHROMAX) 500 mg in sodium chloride 0.9 % 250 mL IVPB        500 mg 250 mL/hr over 60 Minutes Intravenous  Once 01/12/21 2323 01/13/21 0147      Subjective  The patient is resting comfortably. No new complaints.   Objective   Vitals:  Vitals:   01/15/21 1331 01/15/21 1417  BP: 111/72   Pulse: (!) 112   Resp: 18   Temp: 97.9 F (36.6 C)    SpO2: 100% 99%    Exam:  Constitutional:  The patient is awake, alert, and oriented x 3. No acute distress. Respiratory:  No increased work of breathing. No wheezes, rales, or rhonchi No tactile fremitus Cardiovascular:  Regular rate and rhythm No murmurs, ectopy, or gallups. No lateral PMI. No thrills. Abdomen:  Abdomen is soft, non-tender, non-distended No hernias, masses, or organomegaly Normoactive bowel sounds.  Musculoskeletal:  No cyanosis, clubbing, or edema Skin:  No rashes, lesions, ulcers palpation of skin: no induration or nodules Neurologic:  CN 2-12 intact Sensation all 4 extremities intact Psychiatric:  Mental status Mood, affect appropriate Orientation to person, place, time  judgment and insight appear intact   I have personally reviewed the following:   Today's Data  Vitals  Lab Data  CBC CMP  Micro Data  Blood culture x 2 pending 3rd set of blood cultures pending  Imaging  CXR  Cardiology Data  EKG  Other Data    Scheduled Meds:  albuterol  2.5 mg Nebulization TID   apixaban  5 mg Oral BID   diclofenac Sodium  2 g Topical QID   guaiFENesin  1,200 mg Oral BID   mouth rinse  15 mL Mouth Rinse BID   mirtazapine  7.5 mg Oral QHS   multivitamin with minerals  1 tablet Oral Daily   senna-docusate  2 tablet Oral QHS   sertraline  50 mg Oral Daily   sodium chloride flush  3 mL Intravenous Q12H   Continuous Infusions:  sodium chloride 10 mL/hr at 01/14/21 1652   azithromycin 500 mg (01/14/21 2135)   cefTRIAXone (ROCEPHIN)  IV 2 g (01/14/21 2252)   vancomycin 1,500 mg (01/15/21 1504)    Principal Problem:   Sepsis due to pneumonia [facility]) Active Problems:   Chronic atrial fibrillation (HCC)   HTN (hypertension)   AKI (acute kidney injury) ([place])   LOS: 2 days    A & P  Sepsis secondary to pneumonia > Presents from facility with fever, cough, weakness. > Initially saturating in the 80s on room air improved on 2 L to the  90s. > Meets sepsis criteria with leukocytosis to 13, fever to 102.3, tachypnea, new oxygen requirement, elevated lactic acid.  Source of pneumonia based on chest x-ray. > Initial lactic acid was 3.6 and was 3.4 on repeat.  Patient has received total 3 L in the ED. - Monitor in progressive unit - Continue with ceftriaxone and azithromycin as started in the ED - Trend renal function electrolytes - Procalcitonin is 4.04 - Follow-up blood cultures - Trend fever curve and white count - Continue to trend lactic acid   Pulmonary edema: Possible pulmonary edema on CXR vs patchy opacities. Will treat with lasix 20 mg IV x 1. Monitor for  response.  Fevers: Throughout the afternoon the patient has had fevers from 101.3 to 102. He has received tylenol and ibuprofen with only minimal improvement. Blood cultures repeated. It has come down somewhat with ice packs. With the fevers the patient has had tachypnea, confusion, and agitation.    AKI > Creatinine elevated to 1.38 from baseline around 1 at admission > Creatinine today 0.83. > Has received multiple liters of IV fluids in the ED will monitor response - Avoid nephrotoxic agents and hypotension - Trend renal function and electrolytes and volume status   Atrial fibrillation > History of atrial fibrillation.  Appear to have episode of possible a flutter with RVR in the ED.  Morphology could have also been consistent with sinus tach or SVT.  This responded to IV fluid bolus.  Appears to be in sinus rhythm now. - Continue home Eliquis, will need pharmacy to confirm medications as he did not arrive with any paperwork that I was able to find from facility. -HR improved.   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 > Noted to have some  tracheal deviation on chest x-ray unclear if this could be related to his prior tracheostomy.  Does report some difficulty swallowing at times. - Thyroid ultrasound for further evaluation will   I have seen and examined this patient myself. I have spent 32 minutes in his evaluation and care.   DVT prophylaxis:      Eliquis Code Status:              Full Family Communication:      None available Disposition Plan:              Patient is from:                        Nursing facility             Anticipated DC to:                   Same as above             Anticipated DC date:               2 to 4 days             Anticipated DC barriers:         None      Abrar Koone, DO [facility] Direct contact: see [email or web address]  7PM-7AM contact night coverage as above 01/15/2021, 3:31 PM  LOS: 0 days

## 2021-01-15 NOTE — Evaluation (Signed)
Clinical/Bedside Swallow Evaluation Patient Details  Name: Robert Carpenter MRN: [id number] Date of Birth: 09-Oct-1933  Today's Date: 01/15/2021 Time: SLP Start Time (ACUTE ONLY): 0940 SLP Stop Time (ACUTE ONLY): 0955 SLP Time Calculation (min) (ACUTE ONLY): 15 min  Past Medical History:  Past Medical History:  Diagnosis Date   Acute on chronic respiratory failure with hypoxia (HCC)    Cardiac arrest ([employer])    Cardiac arrest ([place])    Chronic atrial fibrillation ([place])    COVID-19 virus infection    Hypertension    Past Surgical History:  Past Surgical History:  Procedure Laterality Date   HIP SURGERY     IR REPLC GASTRO/COLONIC TUBE PERCUT W/FLUORO  03/16/2020   IR REPLC GASTRO/COLONIC TUBE PERCUT W/FLUORO  04/07/2020   HPI:  Patient is an 88 y.o. male with PMH: atrial fibrillation, hypertension, history of DVT, history of cardiac arrest, history of gastrostomy and tracheostomy who presents from facility with concern for 1 day of fever, cough and increasing weakness. He had a Modified Barium Swallow study while a patient in [facility] on 03/29/20 and although full report is not available to read, MBS images/films were and showed delay in initiation of swallow, some pharyngeal retention of barium contrast and penetration of thin liquid barium contrast but no aspiration. CXR completed during current admission showed Stable streaky airspace opacities in the left lung base, persistent patchy and reticular airspace opacities throughout the right lung.    Assessment / Plan / Recommendation  Clinical Impression  Patient presents with clinical s/s of dysphagia as per this bedside swallow evaluation however unable to differentiate between baseline cough versus dysphagia-related cough. As patient did have MBS in March of 2022 while a patient in [facility] which showed thin liquid penetration (no aspiration) and mild pharyngeal retention of barium contrast, SLP recommending  to proceed with MBS to r/o penetration and/or aspiration. SLP Visit Diagnosis: Dysphagia, unspecified (R13.10)    Aspiration Risk  Mild aspiration risk    Diet Recommendation Thin liquid;Regular   Liquid Administration via: Cup;Straw Medication Administration: Whole meds with liquid Supervision: Patient able to self feed Compensations: Slow rate;Small sips/bites Postural Changes: Seated upright at 90 degrees    Other  Recommendations Oral Care Recommendations: Oral care BID    Recommendations for follow up therapy are one component of a multi-disciplinary discharge planning process, led by the attending physician.  Recommendations may be updated based on patient status, additional functional criteria and insurance authorization.  Follow up Recommendations Other (comment) (TBD)      Assistance Recommended at Discharge Intermittent Supervision/Assistance  Functional Status Assessment Patient has had a recent decline in their functional status and demonstrates the ability to make significant improvements in function in a reasonable and predictable amount of time.  Frequency and Duration min 1 x/week  1 week       Prognosis Prognosis for Safe Diet Advancement: Good      Swallow Study   General Date of Onset: 01/14/21 HPI: Patient is an 88 y.o. male with PMH: atrial fibrillation, hypertension, history of DVT, history of cardiac arrest, history of gastrostomy and tracheostomy who presents from facility with concern for 1 day of fever, cough and increasing weakness. He had a Modified Barium Swallow study while a patient in [facility] on 03/29/20 and although full report is not available to read, MBS images/films were and showed delay in initiation of swallow, some pharyngeal retention of barium contrast and penetration of thin liquid barium contrast  but no aspiration. CXR completed during current admission showed Stable streaky airspace opacities in the left lung base,  persistent patchy and reticular airspace opacities throughout the right lung. Type of Study: Bedside Swallow Evaluation Previous Swallow Assessment: MBS March 2022 while patient in [place] Prior to this Study: Regular;Thin liquids Temperature Spikes Noted: No Respiratory Status: Nasal cannula History of Recent Intubation: No Behavior/Cognition: Alert;Cooperative;Pleasant mood Oral Cavity Assessment: Within Functional Limits Oral Care Completed by SLP: No Oral Cavity - Dentition: Edentulous;Other (Comment) (top dentures available but not in place) Vision: Functional for self-feeding Self-Feeding Abilities: Able to feed self Patient Positioning: Upright in bed Baseline Vocal Quality: Normal Volitional Cough: Strong Volitional Swallow: Able to elicit    Oral/Motor/Sensory Function Overall Oral Motor/Sensory Function: Within functional limits   Ice Chips     Thin Liquid Thin Liquid: Impaired Presentation: Straw;Self Fed Pharyngeal  Phase Impairments: Cough - Delayed;Cough - Immediate;Throat Clearing - Delayed Other Comments: Patient did exhibit some immediate and delayed throat clearing and coughing but unable to differentiate from his baseline cough that has been reported by nursing as well as patient.    Nectar Thick     Honey Thick     Puree Puree: Not tested   Solid     Solid: Not tested      Sonia Baller, MA, CCC-SLP Speech Therapy

## 2021-01-15 NOTE — Progress Notes (Signed)
Pt very anxious, wake up from sleep trying to get out of bed. Pt assessed to be hyperventilating and stating "I'm winded", out of breath. Oxygen sat checked to be 94% on 3L. Pt educated on slow, deep breathing, relaxing and prn breathing treatment given. Pt report relief, and breathing normal. Will continue to closely monitor. Delia Heady RN

## 2021-01-15 NOTE — Progress Notes (Signed)
Modified Barium Swallow Progress Note  Patient Details  Name: Robert Carpenter MRN: [id number] Date of Birth: [birth year]  Today's Date: 01/15/2021  Modified Barium Swallow completed.  Full report located under Chart Review in the Imaging Section.  Brief recommendations include the following:  Clinical Impression  Patient presents with a mild oropharyngeal dysphagia as per this MBS. Of note, patient exhibiting improved swallow function as per SLP's comparison to radiology images/films from current study to the one completed during previous hospitalization in March of 2022. During current MBS, patient exhibited mild anterior to posterior transit delays with all consistencies during oral phase of swallow. During pharyngeal phase of swallow, he exhibited swallow initiation delay at level of vallecular sinus with regular and puree solids and at level of pyriform sinus with thin liquids. He exhibited flash penetration (very shallow penetration) (PAS 2) with both cup and straw sips of thin liquids. No aspiration observed. He exhibited trace vallecular and pyriform sinus residuals with thin liquids that did clear with subsequent swallows. Patient appears safe with current PO diet (regular solids, thin liquids) and SLP is not recommending further ST f/u at this time.   Swallow Evaluation Recommendations       SLP Diet Recommendations: Regular solids;Thin liquid   Liquid Administration via: Cup;Straw   Medication Administration: Whole meds with liquid   Supervision: Patient able to self feed   Compensations: Slow rate;Small sips/bites   Postural Changes: Seated upright at 90 degrees   Oral Care Recommendations: Oral care BID        Angela Nevin, MA, CCC-SLP Speech Therapy

## 2021-01-15 NOTE — TOC CM/SW Note (Signed)
°  Transition of Care [facility]) Screening Note   Patient Details  Name: Robert Carpenter Date of Birth: Aug 22, 1933   Transition of Care [facility]) CM/SW Contact:    Keller Bounds, Vinnie Langton, LCSW Phone Number: 01/15/2021, 1:59 PM    Transition of Care Department [facility]) has reviewed patient and no TOC needs have been identified at this time. We will continue to monitor patient advancement through interdisciplinary progression rounds. If new patient transition needs arise, please place a TOC consult.

## 2021-01-16 ENCOUNTER — Inpatient Hospital Stay (HOSPITAL_COMMUNITY): Payer: Medicare Other

## 2021-01-16 DIAGNOSIS — I5030 Unspecified diastolic (congestive) heart failure: Secondary | ICD-10-CM | POA: Diagnosis not present

## 2021-01-16 LAB — BASIC METABOLIC PANEL
Anion gap: 11 (ref 5–15)
BUN: 18 mg/dL (ref 8–23)
CO2: 24 mmol/L (ref 22–32)
Calcium: 8.8 mg/dL — ABNORMAL LOW (ref 8.9–10.3)
Chloride: 102 mmol/L (ref 98–111)
Creatinine, Ser: 1.14 mg/dL (ref 0.61–1.24)
GFR, Estimated: >60 mL/min (ref >60)
Glucose, Bld: 115 mg/dL — ABNORMAL HIGH (ref 70–99)
Potassium: 4.3 mmol/L (ref 3.5–5.1)
Sodium: 137 mmol/L (ref 135–145)

## 2021-01-16 LAB — ECHOCARDIOGRAM COMPLETE
AR max vel: 2.38 cm2
AV Peak grad: 5 mmHg
Ao pk vel: 1.12 m/s
Area-P 1/2: 3.61 cm2
Calc EF: 43.6 %
Height: 74 in
S' Lateral: 3 cm
Single Plane A2C EF: 45.4 %
Single Plane A4C EF: 41.9 %
Weight: 2742.4 oz

## 2021-01-16 LAB — CBC WITH DIFFERENTIAL/PLATELET
Abs Immature Granulocytes: 0.06 10*3/uL (ref 0.00–0.07)
Basophils Absolute: 0 10*3/uL (ref 0.0–0.1)
Basophils Relative: 0 %
Eosinophils Absolute: 0.3 10*3/uL (ref 0.0–0.5)
Eosinophils Relative: 3 %
HCT: 37.3 % — ABNORMAL LOW (ref 39.0–52.0)
Hemoglobin: 12.1 g/dL — ABNORMAL LOW (ref 13.0–17.0)
Immature Granulocytes: 1 %
Lymphocytes Relative: 11 %
Lymphs Abs: 1.4 10*3/uL (ref 0.7–4.0)
MCH: 30.7 pg (ref 26.0–34.0)
MCHC: 32.4 g/dL (ref 30.0–36.0)
MCV: 94.7 fL (ref 80.0–100.0)
Monocytes Absolute: 1 10*3/uL (ref 0.1–1.0)
Monocytes Relative: 8 %
Neutro Abs: 9.9 10*3/uL — ABNORMAL HIGH (ref 1.7–7.7)
Neutrophils Relative %: 77 %
Platelets: 252 10*3/uL (ref 150–400)
RBC: 3.94 MIL/uL — ABNORMAL LOW (ref 4.22–5.81)
RDW: 14.3 % (ref 11.5–15.5)
WBC: 12.8 10*3/uL — ABNORMAL HIGH (ref 4.0–10.5)
nRBC: 0 % (ref 0.0–0.2)

## 2021-01-16 LAB — MRSA NEXT GEN BY PCR, NASAL: MRSA by PCR Next Gen: NOT DETECTED

## 2021-01-16 MED ORDER — GUAIFENESIN-DM 100-10 MG/5ML PO SYRP
5.0000 mL | ORAL_SOLUTION | ORAL | Status: DC | PRN
  Administered 2021-01-16 – 2021-01-20 (×5): 5 mL via ORAL
  Filled 2021-01-16 (×5): qty 10

## 2021-01-16 MED ORDER — LIDOCAINE VISCOUS HCL 2 % MT SOLN
15.0000 mL | OROMUCOSAL | Status: DC | PRN
  Administered 2021-01-16: 15 mL via OROMUCOSAL
  Filled 2021-01-16: qty 15

## 2021-01-16 MED ORDER — LIP MEDEX EX OINT
TOPICAL_OINTMENT | CUTANEOUS | Status: DC | PRN
  Filled 2021-01-16: qty 7

## 2021-01-16 NOTE — Progress Notes (Signed)
Pt assessed to be very anxious, kept on trying to get out of bed insisting "I have to get up". Pt reoriented to the room and call light. NP oncall notified and new order received. Will continue to closely monitor pt. Dionne Bucy RN

## 2021-01-16 NOTE — Progress Notes (Signed)
PROGRESS NOTE  Robert Carpenter [id number] DOB: 1933-10-14 DOA: 01/12/2021 PCP: Rosaria Ferries, MD  Brief History   Robert Carpenter is a 88 y.o. male with medical history significant of atrial fibrillation, hypertension, history of DVT, history of cardiac arrest, history of gastrostomy and tracheostomy who presents from facility with concern for 1 day of fever, cough and increasing weakness.  As above patient presents from his facility where he lives with fever cough and weakness for 1 day.  EMS was called and noted to be saturating 85% on room air improved to 96% on nasal cannula.  There has reportedly been a respiratory illness going around at the facility possibly flu.  Patient reports he has had some ongoing medical issues ever since he was admitted with COVID and had a cardiac arrest last year.  Patient denies fever, chills, chest pain, abdominal pain, constipation, diarrhea, nausea, vomiting.    ED Course: Vital signs in the ED significant for blood pressure in the 315X to 458P systolic, respiratory rate in the teens to 20s on 2 L to maintain oxygen, temperature of 102.3, did have episode of significant tachycardia into the 140s.  Lab work-up shows CMP with sodium 134, creatinine elevated 1.38 from baseline of 1, glucose 182, calcium 8.4, AST 65, ALT 201, alk phos 134.  CBC with leukocytosis to 13.  Lactic acid initially elevated at 3.6 and then improving to 3.4 and repeat.  Respiratory panel for flu and COVID-negative.  Chest x-ray showed chronic lung disease with superimposed groundglass opacity on the right suspicious for pneumonia, cardiomegaly, tracheal deviation to the right suspicious for possible peritracheal mass or enlarged thyroid no prior to compare available to radiologist at the time of the read.   While in the ED patient developed what appears to be episode of atrial flutter with RVR considering his history of A. fib rate in the 140s per EKG as below.  Patient  responded to fluid bolus.   Patient additionally received azithromycin, ceftriaxone, Tylenol, above 3 L fluid bolus and DuoNeb in the ED.  [facility] was consulted to admit the patient for further evaluation and treatment.  On 01/14/2021 the patient has had fevers, increased agitation, and confusion. This appears to have resolved. CXR demonstrated patchy infiltrates vs pulmonary edema.  Echocardiogram was performed on 01/16/2021. It has demonstrated an EF of 40-45% with mildly decreased function with global hypokineses. There is mild left hypertrophy and grade 1 diastolic dysfunction. RV systolic function is normal. She has mildly elevated pulmonary artery systolic pressure.   Consultants    Procedures  None  Antibiotics   Anti-infectives (From admission, onward)    Start     Dose/Rate Route Frequency Ordered Stop   01/14/21 2000  vancomycin (VANCOREADY) IVPB 1250 mg/250 mL  Status:  Discontinued        1,250 mg 166.7 mL/hr over 90 Minutes Intravenous Every 24 hours 01/13/21 1819 01/14/21 1254   01/14/21 1600  vancomycin (VANCOREADY) IVPB 1500 mg/300 mL  Status:  Discontinued        1,500 mg 150 mL/hr over 120 Minutes Intravenous Every 24 hours 01/14/21 1254 01/16/21 1426   01/13/21 2200  cefTRIAXone (ROCEPHIN) 2 g in sodium chloride 0.9 % 100 mL IVPB        2 g 200 mL/hr over 30 Minutes Intravenous Every 24 hours 01/13/21 0007 01/18/21 2159   01/13/21 2200  azithromycin (ZITHROMAX) 500 mg in sodium chloride 0.9 % 250 mL IVPB  500 mg 250 mL/hr over 60 Minutes Intravenous Every 24 hours 01/13/21 0007 01/18/21 2159   01/13/21 1830  vancomycin (VANCOREADY) IVPB 1250 mg/250 mL        1,250 mg 166.7 mL/hr over 90 Minutes Intravenous  Once 01/13/21 1819 01/13/21 2200   01/12/21 2330  cefTRIAXone (ROCEPHIN) 1 g in sodium chloride 0.9 % 100 mL IVPB        1 g 200 mL/hr over 30 Minutes Intravenous  Once 01/12/21 2323 01/13/21 0147   01/12/21 2330  azithromycin (ZITHROMAX) 500 mg in  sodium chloride 0.9 % 250 mL IVPB        500 mg 250 mL/hr over 60 Minutes Intravenous  Once 01/12/21 2323 01/13/21 0147      Subjective  The patient is resting comfortably. No new complaints.   Objective   Vitals:  Vitals:   01/16/21 1414 01/16/21 1454  BP: (!) 127/57   Pulse: 64   Resp: 16   Temp: 98.7 F (37.1 C)   SpO2: 100% 98%    Exam:  Constitutional:  The patient is awake, alert, and oriented x 3. No acute distress. Respiratory:  No increased work of breathing. No wheezes, rales, or rhonchi No tactile fremitus Cardiovascular:  Regular rate and rhythm No murmurs, ectopy, or gallups. No lateral PMI. No thrills. Abdomen:  Abdomen is soft, non-tender, non-distended No hernias, masses, or organomegaly Normoactive bowel sounds.  Musculoskeletal:  No cyanosis, clubbing, or edema Skin:  No rashes, lesions, ulcers palpation of skin: no induration or nodules Neurologic:  CN 2-12 intact Sensation all 4 extremities intact Psychiatric:  Mental status Mood, affect appropriate Orientation to person, place, time  judgment and insight appear intact   I have personally reviewed the following:   Today's Data  Vitals  Lab Data  CBC CMP  Micro Data  Blood culture x 2 pending 3rd set of blood cultures pending  Imaging  CXR  Cardiology Data  EKG  Other Data    Scheduled Meds:  albuterol  2.5 mg Nebulization TID   apixaban  5 mg Oral BID   diclofenac Sodium  2 g Topical QID   guaiFENesin  1,200 mg Oral BID   mouth rinse  15 mL Mouth Rinse BID   mirtazapine  7.5 mg Oral QHS   multivitamin with minerals  1 tablet Oral Daily   senna-docusate  2 tablet Oral QHS   sertraline  50 mg Oral Daily   sodium chloride flush  3 mL Intravenous Q12H   Continuous Infusions:  sodium chloride 10 mL/hr at 01/16/21 0509   azithromycin Stopped (01/15/21 2149)   cefTRIAXone (ROCEPHIN)  IV Stopped (01/15/21 2304)    Principal Problem:   Sepsis due to pneumonia  [facility]) Active Problems:   Chronic atrial fibrillation (HCC)   HTN (hypertension)   AKI (acute kidney injury) ([place])   LOS: 3 days    A & P  Sepsis secondary to pneumonia >Resolved.  >Presented from facility with fever, cough, weakness. > Initially saturating in the 80s on room air improved on 2 L to the 90s. > Meets sepsis criteria with leukocytosis to 13, fever to 102.3, tachypnea, new oxygen requirement, elevated lactic acid.  Source of pneumonia based on chest x-ray. > Initial lactic acid was 3.6 and was 3.4 on repeat.  Patient has received total 3 L in the ED. - Monitor in progressive unit - Continue with ceftriaxone and azithromycin as started in the ED - Trend renal function electrolytes - Procalcitonin is  4.04 - Follow-up blood cultures - Trend fever curve and white count - Continue to trend lactic acid  She is currently saturating 98% on 3L.  Pulmonary edema: Possible pulmonary edema on CXR vs patchy opacities. Will treat with lasix 20 mg IV x 1. Sub optimal response to diuresis. Echocardiogram was performed on 01/16/2021. It has demonstrated an EF of 40-45% with mildly decreased function with global hypokineses. There is mild left hypertrophy and grade 1 diastolic dysfunction. RV systolic function is normal. She has mildly elevated pulmonary artery systolic pressure.   Fevers: Throughout the afternoon the patient has had fevers from 101.3 to 102. He has received tylenol and ibuprofen with only minimal improvement. Blood cultures repeated. It has come down somewhat with ice packs. With the fevers the patient has had tachypnea, confusion, and agitation.    AKI > Creatinine elevated to 1.38 from baseline around 1 at admission > Creatinine today 1.14. > Has received multiple liters of IV fluids in the ED will monitor response - Avoid nephrotoxic agents and hypotension - Trend renal function and electrolytes and volume status   Atrial fibrillation > History of atrial  fibrillation.  Appear to have episode of possible a flutter with RVR in the ED.  Morphology could have also been consistent with sinus tach or SVT.  This responded to IV fluid bolus.  Appears to be in sinus rhythm now. - Continue home Eliquis, will need pharmacy to confirm medications as he did not arrive with any paperwork that I was able to find from facility. -HR improved.   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 > Noted to have some tracheal deviation on chest x-ray unclear if this could be related to his prior tracheostomy.  Does report some difficulty swallowing at times. - Thyroid ultrasound for further evaluation will   I have seen and examined this patient myself. I have spent 30 minutes in his evaluation and care.   DVT prophylaxis:      Eliquis Code Status:              Full Family Communication:      None available Disposition Plan:              Patient is from:                        Nursing facility             Anticipated DC to:                   Same as above             Anticipated DC date:               2 to 4 days             Anticipated DC barriers:         None      Alyzae Hawkey, DO [facility] Direct contact: see [email or web address]  7PM-7AM contact night coverage as above 01/16/2021, 5:44 PM  LOS: 0 days

## 2021-01-16 NOTE — Evaluation (Signed)
Occupational Therapy Evaluation Patient Details Name: Robert Carpenter MRN: [id number] DOB: [birth year] Today's Date: 01/16/2021   History of Present Illness Patient is a 88 year old male who presented from LTC facility with cough, fever and weakness. patient was found to have sepsis secondary to pneumonia, pulmonary edema. [id number] fibrillation, hypertension, history of DVT, history of cardiac arrest, history of gastrostomy and tracheostomy   Clinical Impression   Patient is a 88 year old male who reported he has been living in LTC facility for about 1 year. Patient was inconsistent in reports of PLOF with family not in room at this time. Patient was noted to have HR range from 60 bpm at rest to 110 bpm with slight activity then range back down to 60s quickly. Patient was min guard for transfers with posterior leaning noted. Nursing made aware. Patient was noted to be lethargic on this date with increased need of encouragement to participate in tasks. Patient was noted to have had a decline in functional activity tolerance, increased need for O2, decreased safety awareness and decreased strength impacting participation in ADLs. Patient would continue to benefit from skilled OT services at this time while admitted and after d/c to address noted deficits in order to improve overall safety and independence in ADLs.        Recommendations for follow up therapy are one component of a multi-disciplinary discharge planning process, led by the attending physician.  Recommendations may be updated based on patient status, additional functional criteria and insurance authorization.   Follow Up Recommendations  Other (comment) (return to LTC with skilled OT services)    Assistance Recommended at Discharge Frequent or constant Supervision/Assistance  Patient can return home with the following A little help with walking and/or transfers;A little help with bathing/dressing/bathroom;Direct supervision/assist  for medications management;Direct supervision/assist for financial management    Functional Status Assessment  Patient has had a recent decline in their functional status and demonstrates the ability to make significant improvements in function in a reasonable and predictable amount of time.  Equipment Recommendations  None recommended by OT    Recommendations for Other Services       Precautions / Restrictions Precautions Precautions: Fall Precaution Comments: monitor HR and O2 Restrictions Weight Bearing Restrictions: No      Mobility Bed Mobility Overal bed mobility: Needs Assistance Bed Mobility: Supine to Sit     Supine to sit: Min guard;HOB elevated     General bed mobility comments: with increased time and foley cath management    Transfers                          Balance Overall balance assessment: Mild deficits observed, not formally tested                                         ADL either performed or assessed with clinical judgement   ADL Overall ADL's : Needs assistance/impaired Eating/Feeding: Set up;Sitting   Grooming: Wash/dry face;Oral care;Sitting;Set up   Upper Body Bathing: Set up;Sitting   Lower Body Bathing: Moderate assistance;Sit to/from stand;Sitting/lateral leans Lower Body Bathing Details (indicate cue type and reason): patient indicated having broken L hip in past and still gives him trouble Upper Body Dressing : Set up;Sitting   Lower Body Dressing: Moderate assistance;Sit to/from stand;Sitting/lateral leans Lower Body Dressing Details (indicate cue type and reason): patient  was able to don/doff sock on RLE but not LLE see above for reasoning. Toilet Transfer: Psychologist, sport and exercise Details (indicate cue type and reason): patient was able to complete stand piviot to recliner in room with increased time and cues for proper hand placement. Toileting- Clothing Manipulation and Hygiene:  Moderate assistance;Sit to/from stand;Sitting/lateral lean               Vision Patient Visual Report: No change from baseline       Perception     Praxis      Pertinent Vitals/Pain Pain Assessment: No/denies pain     Hand Dominance Right   Extremity/Trunk Assessment Upper Extremity Assessment Upper Extremity Assessment: Overall WFL for tasks assessed   Lower Extremity Assessment Lower Extremity Assessment: Defer to PT evaluation   Cervical / Trunk Assessment Cervical / Trunk Assessment: Normal   Communication Communication Communication: No difficulties   Cognition Arousal/Alertness: Awake/alert Behavior During Therapy: WFL for tasks assessed/performed Overall Cognitive Status: Within Functional Limits for tasks assessed                                 General Comments: patient was noted to make conflicting statements for PLOF. no family present at this time.     General Comments       Exercises     Shoulder Instructions      Home Living Family/patient expects to be discharged to:: Skilled nursing facility                                        Prior Functioning/Environment               Mobility Comments: patient reported being independent with no AD for mobility. later in session reported using w/c. no family present in room to give PLOF. ADLs Comments: patient reported being independent in ADLs.        OT Problem List: Decreased activity tolerance;Impaired balance (sitting and/or standing);Decreased safety awareness;Cardiopulmonary status limiting activity;Decreased knowledge of precautions;Decreased knowledge of use of DME or AE      OT Treatment/Interventions: Self-care/ADL training;Therapeutic exercise;Neuromuscular education;DME and/or AE instruction;Therapeutic activities;Balance training;Patient/family education    OT Goals(Current goals can be found in the care plan section) Acute Rehab OT Goals OT  Goal Formulation: Patient unable to participate in goal setting Time For Goal Achievement: 01/30/21 Potential to Achieve Goals: Fair  OT Frequency: Min 2X/week    Co-evaluation              AM-PAC OT "6 Clicks" Daily Activity     Outcome Measure Help from another person eating meals?: A Little Help from another person taking care of personal grooming?: A Little Help from another person toileting, which includes using toliet, bedpan, or urinal?: A Lot Help from another person bathing (including washing, rinsing, drying)?: A Lot Help from another person to put on and taking off regular upper body clothing?: A Lot Help from another person to put on and taking off regular lower body clothing?: A Lot 6 Click Score: 14   End of Session Equipment Utilized During Treatment: Gait belt Nurse Communication: Mobility status  Activity Tolerance: Patient tolerated treatment well Patient left: in chair;with call bell/phone within reach;with chair alarm set;with nursing/sitter in room  OT Visit Diagnosis: Unsteadiness on feet (R26.81);Muscle weakness (generalized) (M62.81)  Time: 1510-1533 OT Time Calculation (min): 23 min Charges:  OT Evaluation $OT Eval Low Complexity: 1 Low OT Treatments $Self Care/Home Management : 8-22 mins  Jackelyn Poling OTR/L, MS Acute Rehabilitation Department Office# [phone] Pager# [phone]   Marcellina Millin 01/16/2021, 3:56 PM

## 2021-01-17 LAB — CBC WITH DIFFERENTIAL/PLATELET
Abs Immature Granulocytes: 0.04 10*3/uL (ref 0.00–0.07)
Basophils Absolute: 0 10*3/uL (ref 0.0–0.1)
Basophils Relative: 0 %
Eosinophils Absolute: 0.8 10*3/uL — ABNORMAL HIGH (ref 0.0–0.5)
Eosinophils Relative: 7 %
HCT: 35.9 % — ABNORMAL LOW (ref 39.0–52.0)
Hemoglobin: 11.6 g/dL — ABNORMAL LOW (ref 13.0–17.0)
Immature Granulocytes: 0 %
Lymphocytes Relative: 15 %
Lymphs Abs: 1.7 10*3/uL (ref 0.7–4.0)
MCH: 30.6 pg (ref 26.0–34.0)
MCHC: 32.3 g/dL (ref 30.0–36.0)
MCV: 94.7 fL (ref 80.0–100.0)
Monocytes Absolute: 1.2 10*3/uL — ABNORMAL HIGH (ref 0.1–1.0)
Monocytes Relative: 11 %
Neutro Abs: 7.2 10*3/uL (ref 1.7–7.7)
Neutrophils Relative %: 67 %
Platelets: 256 10*3/uL (ref 150–400)
RBC: 3.79 MIL/uL — ABNORMAL LOW (ref 4.22–5.81)
RDW: 14.6 % (ref 11.5–15.5)
WBC: 10.9 10*3/uL — ABNORMAL HIGH (ref 4.0–10.5)
nRBC: 0 % (ref 0.0–0.2)

## 2021-01-17 LAB — CREATININE, SERUM
Creatinine, Ser: 1.09 mg/dL (ref 0.61–1.24)
GFR, Estimated: >60 mL/min (ref >60)

## 2021-01-17 MED ORDER — ALBUTEROL SULFATE (2.5 MG/3ML) 0.083% IN NEBU
2.5000 mg | INHALATION_SOLUTION | Freq: Two times a day (BID) | RESPIRATORY_TRACT | Status: DC
  Administered 2021-01-17 – 2021-01-23 (×12): 2.5 mg via RESPIRATORY_TRACT
  Filled 2021-01-17 (×12): qty 3

## 2021-01-17 MED ORDER — MELATONIN 3 MG PO TABS
3.0000 mg | ORAL_TABLET | Freq: Once | ORAL | Status: AC
  Administered 2021-01-17: 3 mg via ORAL
  Filled 2021-01-17: qty 1

## 2021-01-17 MED ORDER — MELATONIN 5 MG PO TABS: 5.0000 mg | ORAL_TABLET | Freq: Once | ORAL | Status: DC

## 2021-01-17 NOTE — Progress Notes (Signed)
PROGRESS NOTE  Robert Carpenter NMM:[id number] DOB: 1933-10-14 DOA: 01/12/2021 PCP: Rosaria Ferries, MD  Brief History   Robert Carpenter is a 88 y.o. male with medical history significant of atrial fibrillation, hypertension, history of DVT, history of cardiac arrest, history of gastrostomy and tracheostomy who presents from facility with concern for 1 day of fever, cough and increasing weakness.  As above patient presents from his facility where he lives with fever cough and weakness for 1 day.  EMS was called and noted to be saturating 85% on room air improved to 96% on nasal cannula.  There has reportedly been a respiratory illness going around at the facility possibly flu.  Patient reports he has had some ongoing medical issues ever since he was admitted with COVID and had a cardiac arrest last year.  Patient denies fever, chills, chest pain, abdominal pain, constipation, diarrhea, nausea, vomiting.    ED Course: Vital signs in the ED significant for blood pressure in the 315X to 458P systolic, respiratory rate in the teens to 20s on 2 L to maintain oxygen, temperature of 102.3, did have episode of significant tachycardia into the 140s.  Lab work-up shows CMP with sodium 134, creatinine elevated 1.38 from baseline of 1, glucose 182, calcium 8.4, AST 65, ALT 201, alk phos 134.  CBC with leukocytosis to 13.  Lactic acid initially elevated at 3.6 and then improving to 3.4 and repeat.  Respiratory panel for flu and COVID-negative.  Chest x-ray showed chronic lung disease with superimposed groundglass opacity on the right suspicious for pneumonia, cardiomegaly, tracheal deviation to the right suspicious for possible peritracheal mass or enlarged thyroid no prior to compare available to radiologist at the time of the read.   While in the ED patient developed what appears to be episode of atrial flutter with RVR considering his history of A. fib rate in the 140s per EKG as below.  Patient  responded to fluid bolus.   Patient additionally received azithromycin, ceftriaxone, Tylenol, above 3 L fluid bolus and DuoNeb in the ED.  [facility] was consulted to admit the patient for further evaluation and treatment.  On 01/14/2021 the patient has had fevers, increased agitation, and confusion. This appears to have resolved. CXR demonstrated patchy infiltrates vs pulmonary edema.  Echocardiogram was performed on 01/16/2021. It has demonstrated an EF of 40-45% with mildly decreased function with global hypokineses. There is mild left hypertrophy and grade 1 diastolic dysfunction. RV systolic function is normal. She has mildly elevated pulmonary artery systolic pressure.   Consultants    Procedures  None  Antibiotics   Anti-infectives (From admission, onward)    Start     Dose/Rate Route Frequency Ordered Stop   01/14/21 2000  vancomycin (VANCOREADY) IVPB 1250 mg/250 mL  Status:  Discontinued        1,250 mg 166.7 mL/hr over 90 Minutes Intravenous Every 24 hours 01/13/21 1819 01/14/21 1254   01/14/21 1600  vancomycin (VANCOREADY) IVPB 1500 mg/300 mL  Status:  Discontinued        1,500 mg 150 mL/hr over 120 Minutes Intravenous Every 24 hours 01/14/21 1254 01/16/21 1426   01/13/21 2200  cefTRIAXone (ROCEPHIN) 2 g in sodium chloride 0.9 % 100 mL IVPB        2 g 200 mL/hr over 30 Minutes Intravenous Every 24 hours 01/13/21 0007 01/18/21 2159   01/13/21 2200  azithromycin (ZITHROMAX) 500 mg in sodium chloride 0.9 % 250 mL IVPB  500 mg 250 mL/hr over 60 Minutes Intravenous Every 24 hours 01/13/21 0007 01/18/21 2159   01/13/21 1830  vancomycin (VANCOREADY) IVPB 1250 mg/250 mL        1,250 mg 166.7 mL/hr over 90 Minutes Intravenous  Once 01/13/21 1819 01/13/21 2200   01/12/21 2330  cefTRIAXone (ROCEPHIN) 1 g in sodium chloride 0.9 % 100 mL IVPB        1 g 200 mL/hr over 30 Minutes Intravenous  Once 01/12/21 2323 01/13/21 0147   01/12/21 2330  azithromycin (ZITHROMAX) 500 mg in  sodium chloride 0.9 % 250 mL IVPB        500 mg 250 mL/hr over 60 Minutes Intravenous  Once 01/12/21 2323 01/13/21 0147      Subjective  The patient is resting comfortably. No new complaints.   Objective   Vitals:  Vitals:   01/17/21 0726 01/17/21 1339  BP:  (!) 153/87  Pulse:  74  Resp:  18  Temp:  98 F (36.7 C)  SpO2: 99% 99%    Exam:  Constitutional:  The patient is awake, alert, and oriented x 3. No acute distress. Respiratory:  No increased work of breathing. No wheezes, rales, or rhonchi No tactile fremitus Cardiovascular:  Regular rate and rhythm No murmurs, ectopy, or gallups. No lateral PMI. No thrills. Abdomen:  Abdomen is soft, non-tender, non-distended No hernias, masses, or organomegaly Normoactive bowel sounds.  Musculoskeletal:  No cyanosis, clubbing, or edema Skin:  No rashes, lesions, ulcers palpation of skin: no induration or nodules Neurologic:  CN 2-12 intact Sensation all 4 extremities intact Psychiatric:  Mental status Mood, affect appropriate Orientation to person, place, time  judgment and insight appear intact   I have personally reviewed the following:   Today's Data  Vitals  Lab Data  CBC CMP  Micro Data  Blood culture x 2 pending 3rd set of blood cultures pending  Imaging  CXR  Cardiology Data  EKG  Other Data    Scheduled Meds:  albuterol  2.5 mg Nebulization BID   apixaban  5 mg Oral BID   diclofenac Sodium  2 g Topical QID   guaiFENesin  1,200 mg Oral BID   mouth rinse  15 mL Mouth Rinse BID   mirtazapine  7.5 mg Oral QHS   multivitamin with minerals  1 tablet Oral Daily   senna-docusate  2 tablet Oral QHS   sertraline  50 mg Oral Daily   sodium chloride flush  3 mL Intravenous Q12H   Continuous Infusions:  sodium chloride 10 mL/hr at 01/17/21 1736   azithromycin Stopped (01/16/21 2130)   cefTRIAXone (ROCEPHIN)  IV Stopped (01/16/21 2236)    Principal Problem:   Sepsis due to pneumonia  Robert Carpenter) Active Problems:   Chronic atrial fibrillation (HCC)   HTN (hypertension)   AKI (acute kidney injury) (Robert Carpenter)   LOS: 4 days    A & P  Sepsis secondary to pneumonia >Resolved.  >Presented from facility with fever, cough, weakness. > Initially saturating in the 80s on room air improved on 2 L to the 90s. > Meets sepsis criteria with leukocytosis to 13, fever to 102.3, tachypnea, new oxygen requirement, elevated lactic acid.  Source of pneumonia based on chest x-ray. > Initial lactic acid was 3.6 and was 3.4 on repeat.  Patient has received total 3 L in the ED. - Monitor in progressive unit - Continue with ceftriaxone and azithromycin as started in the ED - Trend renal function electrolytes - Procalcitonin is  4.04 - Follow-up blood cultures - Trend fever curve and white count - Continue to trend lactic acid  She is currently saturating 98% on 3L.  Pulmonary edema: Possible pulmonary edema on CXR vs patchy opacities. Will treat with lasix 20 mg IV x 1. Sub optimal response to diuresis. Echocardiogram was performed on 01/16/2021. It has demonstrated an EF of 40-45% with mildly decreased function with global hypokineses. There is mild left hypertrophy and grade 1 diastolic dysfunction. RV systolic function is normal. She has mildly elevated pulmonary artery systolic pressure.   Fevers: Throughout the afternoon the patient has had fevers from 101.3 to 102. He has received tylenol and ibuprofen with only minimal improvement. Blood cultures repeated. It has come down somewhat with ice packs. With the fevers the patient has had tachypnea, confusion, and agitation.    AKI > Creatinine elevated to 1.38 from baseline around 1 at admission > Creatinine today 1.14. > Has received multiple liters of IV fluids in the ED will monitor response - Avoid nephrotoxic agents and hypotension - Trend renal function and electrolytes and volume status   Atrial fibrillation > History of atrial  fibrillation.  Appear to have episode of possible a flutter with RVR in the ED.  Morphology could have also been consistent with sinus tach or SVT.  This responded to IV fluid bolus.  Appears to be in sinus rhythm now. - Continue home Eliquis, will need pharmacy to confirm medications as he did not arrive with any paperwork that I was able to find from facility. -HR improved.   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 > Noted to have some tracheal deviation on chest x-ray unclear if this could be related to his prior tracheostomy.  Does report some difficulty swallowing at times. - Thyroid ultrasound for further evaluation will   I have seen and examined this patient myself. I have spent 30 minutes in his evaluation and care.   DVT prophylaxis:      Eliquis Code Status:              Full Family Communication:      None available Disposition Plan:              Patient is from:                        Nursing facility             Anticipated DC to:                   Same as above             Anticipated DC date:               2 to 4 days             Anticipated DC barriers:         None      Jaccob Czaplicki, DO [facility] Direct contact: see [email or web address]  7PM-7AM contact night coverage as above 01/17/2021, 6:59 PM  LOS: 0 days

## 2021-01-17 NOTE — TOC Initial Note (Signed)
Transition of Care [facility]) - Initial/Assessment Note    Patient Details  Name: Robert Carpenter MRN: [id number] Date of Birth: July 04, 1933  Transition of Care [facility]) CM/SW Contact:    Golda Acre, RN Phone Number: 01/17/2021, 8:37 AM  Clinical Narrative:                  Transition of Care [facility]) Screening Note   Patient Details  Name: Robert Carpenter Date of Birth: 10-10-1933   Transition of Care [facility]) CM/SW Contact:    Golda Acre, RN Phone Number: 01/17/2021, 8:37 AM    Transition of Care Department [facility]) has reviewed patient and no TOC needs have been identified at this time. We will continue to monitor patient advancement through interdisciplinary progression rounds. If new patient transition needs arise, please place a TOC consult.    Expected Discharge Plan: Home/Self Care Barriers to Discharge: Continued Medical Work up   Patient Goals and CMS Choice Patient states their goals for this hospitalization and ongoing recovery are:: to go home CMS Medicare.gov Compare Post Acute Care list provided to:: Patient    Expected Discharge Plan and Services Expected Discharge Plan: Home/Self Care   Discharge Planning Services: CM Consult   Living arrangements for the past 2 months: Single Family Home                                      Prior Living Arrangements/Services Living arrangements for the past 2 months: Single Family Home Lives with:: Spouse Patient language and need for interpreter reviewed:: Yes Do you feel safe going back to the place where you live?: Yes      Need for Family Participation in Patient Care: Yes (Comment) (wife) Care giver support system in place?: Yes (comment) (wife)   Criminal Activity/Legal Involvement Pertinent to Current Situation/Hospitalization: No - Comment as needed  Activities of Daily Living Home Assistive Devices/Equipment: Environmental consultant (specify type) ADL Screening (condition at time of admission) Patient's  cognitive ability adequate to safely complete daily activities?: No Is the patient deaf or have difficulty hearing?: Yes Does the patient have difficulty seeing, even when wearing glasses/contacts?: No Does the patient have difficulty concentrating, remembering, or making decisions?: Yes Patient able to express need for assistance with ADLs?: Yes Does the patient have difficulty dressing or bathing?: No Independently performs ADLs?: No Does the patient have difficulty walking or climbing stairs?: Yes Weakness of Legs: Both Weakness of Arms/Hands: None  Permission Sought/Granted                  Emotional Assessment Appearance:: Appears stated age Attitude/Demeanor/Rapport: Engaged Affect (typically observed): Calm Orientation: : Oriented to Place, Oriented to Self, Oriented to  Time, Oriented to Situation Alcohol / Substance Use: Not Applicable Psych Involvement: No (comment)  Admission diagnosis:  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] Sepsis due to pneumonia (HCC) [J18.9, A41.9] Community acquired pneumonia of right lung, unspecified part of lung [J18.9] Patient Active Problem List   Diagnosis Date Noted   Sepsis due to pneumonia (HCC) 01/13/2021   HTN (hypertension) 01/13/2021   AKI (acute kidney injury) (HCC) 01/13/2021   Chronic atrial fibrillation (HCC)    COVID-19 virus infection    History of DVT (deep vein thrombosis) 02/19/2020   History of cardiac arrest 02/19/2020   PCP:  Shayne Alken, MD Pharmacy:  No Pharmacies Listed    Social Determinants of Health (SDOH) Interventions  Readmission Risk Interventions No flowsheet data found.

## 2021-01-17 NOTE — Evaluation (Signed)
Physical Therapy Evaluation Patient Details Name: Robert Carpenter MRN: [id number] DOB: 03-21-1933 Today's Date: 01/17/2021  History of Present Illness  Patient is a 88 year old male who presented from LTC facility with cough, fever and weakness. patient was found to have sepsis secondary to pneumonia, pulmonary edema. [id number] fibrillation, hypertension, history of DVT, history of cardiac arrest, history of gastrostomy and tracheostomy.    Clinical Impression  Robert Carpenter is 88 y.o. male admitted with above HPI and diagnosis. Patient is currently limited by functional impairments below (see PT problem list). Patient admitted form LTC facility and unable to provide clear history about PLOF. He was limited to bed>chair transfer with Min assist and RW. Pt limited by increased HR form 60's at rest to 120's with transfer. Patient will benefit from continued skilled PT interventions to address impairments and progress independence with mobility, recommending return to LTC facility with PT services. Acute PT will follow and progress as able.        Recommendations for follow up therapy are one component of a multi-disciplinary discharge planning process, led by the attending physician.  Recommendations may be updated based on patient status, additional functional criteria and insurance authorization.  Follow Up Recommendations  (Return to LTC facility with PT)    Assistance Recommended at Discharge Frequent or constant Supervision/Assistance  Patient can return home with the following       Equipment Recommendations None recommended by PT  Recommendations for Other Services       Functional Status Assessment Patient has had a recent decline in their functional status and demonstrates the ability to make significant improvements in function in a reasonable and predictable amount of time.     Precautions / Restrictions Precautions Precautions: Fall Precaution Comments: monitor HR and  O2 Restrictions Weight Bearing Restrictions: No      Mobility  Bed Mobility Overal bed mobility: Needs Assistance Bed Mobility: Supine to Sit     Supine to sit: Min guard, HOB elevated     General bed mobility comments: pt taking extra time and cues needed for use of bed rail.    Transfers Overall transfer level: Needs assistance   Transfers: Sit to/from Stand, Bed to chair/wheelchair/BSC Sit to Stand: Min assist   Step pivot transfers: Min assist       General transfer comment: cues for safe hand placement and technique with RW. Assist to rise from EOB and steady. pt required assist to manage walker and take small steps to recliner. pt c/o dizziness and HR elevated to 120's with transfer, gait deferred. HR returned to 60's at rest.    Ambulation/Gait                  Stairs            Wheelchair Mobility    Modified Rankin (Stroke Patients Only)       Balance Overall balance assessment: Needs assistance Sitting-balance support: Feet supported Sitting balance-Leahy Scale: Good   Postural control: Posterior lean Standing balance support: Reliant on assistive device for balance, During functional activity, Bilateral upper extremity supported Standing balance-Leahy Scale: Fair                               Pertinent Vitals/Pain Pain Assessment Pain Assessment: PAINAD Breathing: normal Negative Vocalization: none Facial Expression: smiling or inexpressive Body Language: relaxed Consolability: no need to console PAINAD Score: 0 Pain Intervention(s): Monitored during session  Home Living Family/patient expects to be discharged to:: Skilled nursing facility   Available Help at Discharge: [place];Available 24 hours/day                    Prior Function Prior Level of Function : Patient poor historian/Family not available             Mobility Comments: patient reported being independent with no AD  for mobility. later in session reported using w/c. no family present in room to give PLOF.       Hand Dominance   Dominant Hand: Right    Extremity/Trunk Assessment   Upper Extremity Assessment Upper Extremity Assessment: Defer to OT evaluation;Overall [facility] for tasks assessed    Lower Extremity Assessment Lower Extremity Assessment: Generalized weakness    Cervical / Trunk Assessment Cervical / Trunk Assessment: Normal  Communication   Communication: No difficulties  Cognition Arousal/Alertness: Awake/alert Behavior During Therapy: WFL for tasks assessed/performed Overall Cognitive Status: No family/caregiver present to determine baseline cognitive functioning                                 General Comments: per chart pt making conflicting statemen ton PLOF, unreliable historian        General Comments      Exercises     Assessment/Plan    PT Assessment Patient needs continued PT services  PT Problem List Decreased strength;Decreased activity tolerance;Decreased balance;Decreased mobility;Decreased knowledge of use of DME;Decreased knowledge of precautions;Decreased safety awareness       PT Treatment Interventions DME instruction;Gait training;Stair training;Functional mobility training;Therapeutic activities;Therapeutic exercise;Balance training;Neuromuscular re-education;Patient/family education    PT Goals (Current goals can be found in the Care Plan section)  Acute Rehab PT Goals Patient Stated Goal: move more PT Goal Formulation: With patient Time For Goal Achievement: 01/31/21 Potential to Achieve Goals: Good    Frequency Min 2X/week     Co-evaluation               AM-PAC PT "6 Clicks" Mobility  Outcome Measure Help needed turning from your back to your side while in a flat bed without using bedrails?: A Little Help needed moving from lying on your back to sitting on the side of a flat bed without using bedrails?: A Little Help  needed moving to and from a bed to a chair (including a wheelchair)?: A Little Help needed standing up from a chair using your arms (e.g., wheelchair or bedside chair)?: A Little Help needed to walk in hospital room?: A Lot Help needed climbing 3-5 steps with a railing? : A Lot 6 Click Score: 16    End of Session Equipment Utilized During Treatment: Gait belt Activity Tolerance: Patient tolerated treatment well Patient left: in chair;with call bell/phone within reach;with chair alarm set Nurse Communication: Mobility status PT Visit Diagnosis: Muscle weakness (generalized) (M62.81);Difficulty in walking, not elsewhere classified (R26.2);Unsteadiness on feet (R26.81)    Time: [id number] PT Time Calculation (min) (ACUTE ONLY): 24 min   Charges:   PT Evaluation $PT Eval Low Complexity: 1 Low PT Treatments $Therapeutic Activity: 8-22 mins        Verner Mould, DPT Acute Rehabilitation Services Office [phone] Pager [phone]   Jacques Navy 01/17/2021, 3:14 PM

## 2021-01-17 NOTE — Plan of Care (Signed)
  Problem: Nutrition: Goal: Adequate nutrition will be maintained Outcome: Progressing   Problem: Coping: Goal: Level of anxiety will decrease Outcome: Progressing   Problem: Pain Managment: Goal: General experience of comfort will improve Outcome: Progressing   

## 2021-01-18 DIAGNOSIS — N179 Acute kidney failure, unspecified: Secondary | ICD-10-CM

## 2021-01-18 DIAGNOSIS — J9601 Acute respiratory failure with hypoxia: Secondary | ICD-10-CM

## 2021-01-18 DIAGNOSIS — I5022 Chronic systolic (congestive) heart failure: Secondary | ICD-10-CM

## 2021-01-18 DIAGNOSIS — J811 Chronic pulmonary edema: Secondary | ICD-10-CM

## 2021-01-18 DIAGNOSIS — E041 Nontoxic single thyroid nodule: Secondary | ICD-10-CM

## 2021-01-18 DIAGNOSIS — R7881 Bacteremia: Secondary | ICD-10-CM

## 2021-01-18 LAB — CBC WITH DIFFERENTIAL/PLATELET
Abs Immature Granulocytes: 0.07 10*3/uL (ref 0.00–0.07)
Basophils Absolute: 0.1 10*3/uL (ref 0.0–0.1)
Basophils Relative: 0 %
Eosinophils Absolute: 0.6 10*3/uL — ABNORMAL HIGH (ref 0.0–0.5)
Eosinophils Relative: 5 %
HCT: 36.1 % — ABNORMAL LOW (ref 39.0–52.0)
Hemoglobin: 11.7 g/dL — ABNORMAL LOW (ref 13.0–17.0)
Immature Granulocytes: 1 %
Lymphocytes Relative: 17 %
Lymphs Abs: 2 10*3/uL (ref 0.7–4.0)
MCH: 30.5 pg (ref 26.0–34.0)
MCHC: 32.4 g/dL (ref 30.0–36.0)
MCV: 94.3 fL (ref 80.0–100.0)
Monocytes Absolute: 1 10*3/uL (ref 0.1–1.0)
Monocytes Relative: 9 %
Neutro Abs: 7.9 10*3/uL — ABNORMAL HIGH (ref 1.7–7.7)
Neutrophils Relative %: 68 %
Platelets: 268 10*3/uL (ref 150–400)
RBC: 3.83 MIL/uL — ABNORMAL LOW (ref 4.22–5.81)
RDW: 14.3 % (ref 11.5–15.5)
WBC: 11.6 10*3/uL — ABNORMAL HIGH (ref 4.0–10.5)
nRBC: 0 % (ref 0.0–0.2)

## 2021-01-18 LAB — BASIC METABOLIC PANEL
Anion gap: 8 (ref 5–15)
BUN: 12 mg/dL (ref 8–23)
CO2: 30 mmol/L (ref 22–32)
Calcium: 8.8 mg/dL — ABNORMAL LOW (ref 8.9–10.3)
Chloride: 97 mmol/L — ABNORMAL LOW (ref 98–111)
Creatinine, Ser: 0.96 mg/dL (ref 0.61–1.24)
GFR, Estimated: >60 mL/min (ref >60)
Glucose, Bld: 146 mg/dL — ABNORMAL HIGH (ref 70–99)
Potassium: 3.8 mmol/L (ref 3.5–5.1)
Sodium: 135 mmol/L (ref 135–145)

## 2021-01-18 LAB — CULTURE, BLOOD (ROUTINE X 2)
Culture: NO GROWTH
Special Requests: ADEQUATE

## 2021-01-18 MED ORDER — MELATONIN 3 MG PO TABS
3.0000 mg | ORAL_TABLET | Freq: Every day | ORAL | Status: DC
  Administered 2021-01-19 – 2021-01-22 (×4): 3 mg via ORAL
  Filled 2021-01-18 (×5): qty 1

## 2021-01-18 MED ORDER — ALBUTEROL SULFATE (2.5 MG/3ML) 0.083% IN NEBU
2.5000 mg | INHALATION_SOLUTION | Freq: Four times a day (QID) | RESPIRATORY_TRACT | Status: DC | PRN
  Administered 2021-01-18: 2.5 mg via RESPIRATORY_TRACT
  Filled 2021-01-18: qty 3

## 2021-01-18 MED ORDER — LOSARTAN POTASSIUM 25 MG PO TABS
25.0000 mg | ORAL_TABLET | Freq: Every day | ORAL | Status: DC
  Administered 2021-01-18 – 2021-01-21 (×4): 25 mg via ORAL
  Filled 2021-01-18 (×4): qty 1

## 2021-01-18 MED ORDER — AMLODIPINE BESYLATE 5 MG PO TABS: 5.0000 mg | ORAL_TABLET | Freq: Every day | ORAL | Status: DC

## 2021-01-18 NOTE — Assessment & Plan Note (Addendum)
Patient is on amlodipine as an outpatient which has been held this admission. Blood pressure uncontrolled. Blood pressure low with standing while on Losartan 25 mg daily. Patient with symptomatic hypotension with physical therapy on 1/21.Decreased to Losartan 12.5 mg daily with improvement of symptoms.

## 2021-01-18 NOTE — Assessment & Plan Note (Addendum)
Noted on chest x-ray. Given IV lasix with no significant result. Opacity likely related to pneumonia.

## 2021-01-18 NOTE — Assessment & Plan Note (Addendum)
Noted on Transthoracic Echocardiogram. EF of 40-45%. Patient was not on an ACEi/ARB or beta blocker as an outpatient. Started on Losartan 25 mg daily and decreased to Losartan 12.5 mg daily. -Continue Losartan

## 2021-01-18 NOTE — Progress Notes (Signed)
PROGRESS NOTE    Robert Carpenter  [id number] DOB: 1933-03-13 DOA: 01/12/2021 PCP: Rosaria Ferries, MD   Brief Narrative: No notes on file   Assessment & Plan:   * Sepsis due to pneumonia (HCC)-resolved as of 01/18/2021, (present on admission) Patient with associated hypoxia. Patient managed initially on Ceftriaxone/Azithromycin and has completed course.  Acute respiratory failure with hypoxia (HCC) Secondary to pneumonia and possible pulmonary edema vs pulmonary infiltrates. -Wean to room air as able -Keep SpO2 >90%  AKI (acute kidney injury) (HCC)-resolved as of 01/18/2021 Creatinine of 1.38 on admission with baseline of about 1. Resolved with IV fluids.  Chronic systolic CHF (congestive heart failure) (HCC) Noted on Transthoracic Echocardiogram. EF of 40-45%. Patient is not on an ACEi/ARB or beta blocker at this time. -Start Losartan  Pulmonary edema Noted on chest x-ray. Given IV lasix with no significant result.  -Repeat chest x-ray in AM  Thyroid nodule Noted on thyroid ultrasound with recommendation for needle aspiration biopsy.  Chronic atrial fibrillation ([place])- (present on admission) Rate controlled. -Continue Eliquis  HTN (hypertension) Patient is on amlodipine as an outpatient which has been held this admission. Blood pressure uncontrolled. -Starting Losartan secondary to reduced LVEF  Positive blood culture-resolved as of 01/18/2021 Initial blood culture significant for Staphylococcus hominis. Patient was managed empirically with Vancomycin IV which was discontinued since culture result was a likely contaminant. Repeat cultures with no growth.      DVT prophylaxis: Eliquis Code Status:   Code Status: Full Code Family Communication: None at bedside Disposition Plan: Discharge to SNF likely in 1-3 days pending ability to wean oxygen if able   Consultants:  None  Procedures:  TRANSTHORACIC ECHOCARDIOGRAM  IMPRESSIONS     1. Left  ventricular ejection fraction, by estimation, is 40 to 45%. Left  ventricular ejection fraction by 2D MOD biplane is 43.6 %. The left  ventricle has mildly decreased function. The left ventricle demonstrates  global hypokinesis. There is mild left  ventricular hypertrophy. Left ventricular diastolic parameters are  consistent with Grade I diastolic dysfunction (impaired relaxation).   2. Right ventricular systolic function is normal. The right ventricular  size is normal. There is mildly elevated pulmonary artery systolic  pressure. The estimated right ventricular systolic pressure is [id number] mmHg.   3. The mitral valve is grossly normal. Trivial mitral valve  regurgitation.   4. The aortic valve is tricuspid. Aortic valve regurgitation is not  visualized.   5. The inferior vena cava is normal in size with greater than 50%  respiratory variability, suggesting right atrial pressure of 3 mmHg.   6. Rhythm strip during this exam demostrated normal sinus rhythm and 1st  degree AVB.   Antimicrobials: Vancomycin Ceftriaxone Azithromycin    Subjective: No concerns this morning. Sleepy.  Objective: Vitals:   01/18/21 0508 01/18/21 0848 01/18/21 1027 01/18/21 1325  BP: (!) 173/85  (!) 146/74 (!) 143/74  Pulse: 88  73 68  Resp: 17  20 20   Temp: 98.1 F (36.7 C)  98.5 F (36.9 C) 97.7 F (36.5 C)  TempSrc: Oral  Oral Oral  SpO2: 98% 99%  100%  Weight:      Height:        Intake/Output Summary (Last 24 hours) at 01/18/2021 1751 Last data filed at 01/18/2021 1309 Gross per 24 hour  Intake 650 ml  Output 1100 ml  Net -450 ml   Filed Weights   01/13/21 1853  Weight: 77.7 kg    Examination:  General exam: Appears calm and comfortable Respiratory system: Clear to auscultation. Respiratory effort normal. Cardiovascular system: S1 & S2 heard, irregular rhythm with normal rate. Gastrointestinal system: Abdomen is soft and nontender. No organomegaly or masses felt. Normal bowel  sounds heard. Central nervous system: Alert and oriented to self. No focal neurological deficits. Musculoskeletal: No edema. No calf tenderness Skin: No cyanosis. No rashes     Data Reviewed: I have personally reviewed following labs and imaging studies  CBC Lab Results  Component Value Date   WBC 11.6 (H) 01/18/2021   RBC 3.83 (L) 01/18/2021   HGB 11.7 (L) 01/18/2021   HCT 36.1 (L) 01/18/2021   MCV 94.3 01/18/2021   MCH 30.5 01/18/2021   PLT 268 01/18/2021   MCHC 32.4 01/18/2021   RDW 14.3 01/18/2021   LYMPHSABS 2.0 01/18/2021   MONOABS 1.0 01/18/2021   EOSABS 0.6 (H) 01/18/2021   BASOSABS 0.1 [id number]     Last metabolic panel Lab Results  Component Value Date   NA 135 01/18/2021   K 3.8 01/18/2021   CL 97 (L) 01/18/2021   CO2 30 01/18/2021   BUN 12 01/18/2021   CREATININE 0.96 01/18/2021   GLUCOSE 146 (H) 01/18/2021   GFRNONAA >60 01/18/2021   CALCIUM 8.8 (L) 01/18/2021   PHOS 3.7 03/29/2020   PROT 6.8 01/13/2021   ALBUMIN 3.4 (L) 01/13/2021   BILITOT 1.0 01/13/2021   ALKPHOS 115 01/13/2021   AST 48 (H) 01/13/2021   ALT 176 (H) 01/13/2021   ANIONGAP 8 01/18/2021    CBG (last 3)  No results for input(s): GLUCAP in the last 72 hours.   GFR: Estimated Creatinine Clearance: 61.8 mL/min (by C-G formula based on SCr of 0.96 mg/dL).  Coagulation Profile: Recent Labs  Lab 01/13/21 0420  INR 1.6*    Recent Results (from the past 240 hour(s))  Resp Panel by RT-PCR (Flu A&B, Covid) Nasopharyngeal Swab     Status: None   Collection Time: 01/12/21  7:44 PM   Specimen: Nasopharyngeal Swab; Nasopharyngeal(NP) swabs in vial transport medium  Result Value Ref Range Status   SARS Coronavirus 2 by RT PCR NEGATIVE NEGATIVE Final    Comment: (NOTE) SARS-CoV-2 target nucleic acids are NOT DETECTED.  The SARS-CoV-2 RNA is generally detectable in upper respiratory specimens during the acute phase of infection. The lowest concentration of SARS-CoV-2 viral copies  this assay can detect is 138 copies/mL. A negative result does not preclude SARS-Cov-2 infection and should not be used as the sole basis for treatment or other patient management decisions. A negative result may occur with  improper specimen collection/handling, submission of specimen other than nasopharyngeal swab, presence of viral mutation(s) within the areas targeted by this assay, and inadequate number of viral copies(<138 copies/mL). A negative result must be combined with clinical observations, patient history, and epidemiological information. The expected result is Negative.  Fact Sheet for Patients:  EntrepreneurPulse.com.au  Fact Sheet for Healthcare Providers:  IncredibleEmployment.be  This test is no t yet approved or cleared by the [place] FDA and  has been authorized for detection and/or diagnosis of SARS-CoV-2 by FDA under an Emergency Use Authorization (EUA). This EUA will remain  in effect (meaning this test can be used) for the duration of the COVID-19 declaration under Section 564(b)(1) of the Act, 21 U.S.C.section 360bbb-3(b)(1), unless the authorization is terminated  or revoked sooner.       Influenza A by PCR NEGATIVE NEGATIVE Final   Influenza B by PCR NEGATIVE NEGATIVE  Final    Comment: (NOTE) The Xpert Xpress SARS-CoV-2/FLU/RSV plus assay is intended as an aid in the diagnosis of influenza from Nasopharyngeal swab specimens and should not be used as a sole basis for treatment. Nasal washings and aspirates are unacceptable for Xpert Xpress SARS-CoV-2/FLU/RSV testing.  Fact Sheet for Patients: EntrepreneurPulse.com.au  Fact Sheet for Healthcare Providers: IncredibleEmployment.be  This test is not yet approved or cleared by the [place] FDA and has been authorized for detection and/or diagnosis of SARS-CoV-2 by FDA under an Emergency Use Authorization (EUA). This EUA  will remain in effect (meaning this test can be used) for the duration of the COVID-19 declaration under Section 564(b)(1) of the Act, 21 U.S.C. section 360bbb-3(b)(1), unless the authorization is terminated or revoked.  Performed at [facility], [place] [address], [place], [place] [address]   Culture, blood (routine x 2)     Status: Abnormal   Collection Time: 01/12/21  8:00 PM   Specimen: BLOOD  Result Value Ref Range Status   Specimen Description   Final    BLOOD RIGHT ANTECUBITAL Performed at [facility] [address], [place], [place] [address]    Special Requests   Final    BOTTLES DRAWN AEROBIC AND ANAEROBIC Blood Culture adequate volume Performed at [facility] [address], [place], [place] [address]    Culture  Setup Time   Final    GRAM POSITIVE COCCI IN CLUSTERS IN BOTH AEROBIC AND ANAEROBIC BOTTLES CRITICAL RESULT CALLED TO, READ BACK BY AND VERIFIED WITH: E WILLIAMSON PHARMD 1702 01/13/21 A BROWNING    Culture (A)  Final    STAPHYLOCOCCUS HOMINIS THE SIGNIFICANCE OF ISOLATING THIS ORGANISM FROM A SINGLE SET OF BLOOD CULTURES WHEN MULTIPLE SETS ARE DRAWN IS UNCERTAIN. PLEASE NOTIFY THE MICROBIOLOGY DEPARTMENT WITHIN ONE WEEK IF SPECIATION AND SENSITIVITIES ARE REQUIRED. Performed at [facility], [place] [address], [place],  [address]    Report Status 01/15/2021 FINAL  Final  Blood Culture ID Panel (Reflexed)     Status: Abnormal   Collection Time: 01/12/21  8:00 PM  Result Value Ref Range Status   Enterococcus faecalis NOT DETECTED NOT DETECTED Final   Enterococcus Faecium NOT DETECTED NOT DETECTED Final   Listeria monocytogenes NOT DETECTED NOT DETECTED Final   Staphylococcus species DETECTED (A) NOT DETECTED Final    Comment: CRITICAL RESULT CALLED TO, READ BACK BY AND VERIFIED WITH: Cristopher Estimable PHARMD 1702 01/13/21 A BROWNING    Staphylococcus aureus (BCID) NOT DETECTED NOT DETECTED Final    Staphylococcus epidermidis NOT DETECTED NOT DETECTED Final   Staphylococcus lugdunensis NOT DETECTED NOT DETECTED Final   Streptococcus species NOT DETECTED NOT DETECTED Final   Streptococcus agalactiae NOT DETECTED NOT DETECTED Final   Streptococcus pneumoniae NOT DETECTED NOT DETECTED Final   Streptococcus pyogenes NOT DETECTED NOT DETECTED Final   A.calcoaceticus-baumannii NOT DETECTED NOT DETECTED Final   Bacteroides fragilis NOT DETECTED NOT DETECTED Final   Enterobacterales NOT DETECTED NOT DETECTED Final   Enterobacter cloacae complex NOT DETECTED NOT DETECTED Final   Escherichia coli NOT DETECTED NOT DETECTED Final   Klebsiella aerogenes NOT DETECTED NOT DETECTED Final   Klebsiella oxytoca NOT DETECTED NOT DETECTED Final   Klebsiella pneumoniae NOT DETECTED NOT DETECTED Final   Proteus species NOT DETECTED NOT DETECTED Final   Salmonella species NOT DETECTED NOT DETECTED Final   Serratia marcescens NOT DETECTED NOT DETECTED Final   Haemophilus influenzae NOT DETECTED NOT DETECTED Final  Neisseria meningitidis NOT DETECTED NOT DETECTED Final   Pseudomonas aeruginosa NOT DETECTED NOT DETECTED Final   Stenotrophomonas maltophilia NOT DETECTED NOT DETECTED Final   Candida albicans NOT DETECTED NOT DETECTED Final   Candida auris NOT DETECTED NOT DETECTED Final   Candida glabrata NOT DETECTED NOT DETECTED Final   Candida krusei NOT DETECTED NOT DETECTED Final   Candida parapsilosis NOT DETECTED NOT DETECTED Final   Candida tropicalis NOT DETECTED NOT DETECTED Final   Cryptococcus neoformans/gattii NOT DETECTED NOT DETECTED Final    Comment: Performed at [facility], [place] [address] [place], [place] [address]  Culture, blood (routine x 2)     Status: None   Collection Time: 01/12/21  9:53 PM   Specimen: BLOOD  Result Value Ref Range Status   Specimen Description   Final    BLOOD BLOOD RIGHT FOREARM Performed at [facility] [address]  [place], [place] [address]    Special Requests   Final    BOTTLES DRAWN AEROBIC AND ANAEROBIC Blood Culture adequate volume Performed at [facility] [address], [place], [place] [address]    Culture   Final    NO GROWTH 5 DAYS Performed at [facility], [place] [address] [place], [place] [address]    Report Status 01/18/2021 FINAL  Final  Culture, blood (routine x 2)     Status: None (Preliminary result)   Collection Time: 01/13/21  8:03 PM   Specimen: BLOOD  Result Value Ref Range Status   Specimen Description   Final    BLOOD BLOOD RIGHT HAND Performed at [facility] [address], [place], [place] [address]    Special Requests   Final    BOTTLES DRAWN AEROBIC ONLY Blood Culture results may not be optimal due to an inadequate volume of blood received in culture bottles Performed at [facility] [address] [place], [place] [address]    Culture   Final    NO GROWTH 4 DAYS Performed at [facility], [place] [address] [place], [place] [address]    Report Status PENDING  Incomplete  Culture, blood (routine x 2)     Status: None (Preliminary result)   Collection Time: 01/13/21  8:03 PM   Specimen: BLOOD  Result Value Ref Range Status   Specimen Description   Final    BLOOD RIGHT ANTECUBITAL Performed at [facility] [address] [place], [place] [address]    Special Requests   Final    BOTTLES DRAWN AEROBIC ONLY Blood Culture results may not be optimal due to an inadequate volume of blood received in culture bottles Performed at [facility] [address] [place], [place] [address]    Culture   Final    NO GROWTH 4 DAYS Performed at [facility], [place] [address], [place], [place] [address]    Report Status PENDING  Incomplete  Culture, blood (routine x 2)     Status: None (Preliminary result)   Collection Time: 01/14/21  4:56 PM   Specimen: BLOOD LEFT  HAND  Result Value Ref Range Status   Specimen Description   Final    BLOOD LEFT HAND Performed at [facility] [address] [place],  [address]    Special Requests   Final    BOTTLES DRAWN AEROBIC ONLY Blood Culture adequate volume Performed at [facility]  [address] [place], [place] [address]    Culture   Final    NO GROWTH 3 DAYS Performed at [facility], [place] [address], [place], [place] [address]    Report Status PENDING  Incomplete  Culture, blood (routine x 2)     Status: None (Preliminary result)   Collection Time: 01/14/21  5:01 PM   Specimen: Left Antecubital; Blood  Result Value Ref Range Status   Specimen Description   Final    LEFT ANTECUBITAL Performed at [facility] [address], [place], [place] [address]    Special Requests   Final    BOTTLES DRAWN AEROBIC ONLY Blood Culture adequate volume Performed at [facility] [address] [place], [place] [address]    Culture   Final    NO GROWTH 3 DAYS Performed at [facility], [place] [address] [place], [place] [address]    Report Status PENDING  Incomplete  MRSA Next Gen by PCR, Nasal     Status: None   Collection Time: 01/16/21 10:26 AM   Specimen: Nasal Mucosa; Nasal Swab  Result Value Ref Range Status   MRSA by PCR Next Gen NOT DETECTED NOT DETECTED Final    Comment: (NOTE) The GeneXpert MRSA Assay (FDA approved for NASAL specimens only), is one component of a comprehensive MRSA colonization surveillance program. It is not intended to diagnose MRSA infection nor to guide or monitor treatment for MRSA infections. Test performance is not FDA approved in patients less than 65 years old. Performed at [facility], [place] [address] [place],  [address]         Radiology Studies: No results found.      Scheduled Meds:  albuterol  2.5 mg Nebulization BID   apixaban   5 mg Oral BID   diclofenac Sodium  2 g Topical QID   guaiFENesin  1,200 mg Oral BID   mouth rinse  15 mL Mouth Rinse BID   melatonin  3 mg Oral QHS   mirtazapine  7.5 mg Oral QHS   multivitamin with minerals  1 tablet Oral Daily   senna-docusate  2 tablet Oral QHS   sertraline  50 mg Oral Daily   sodium chloride flush  3 mL Intravenous Q12H   Continuous Infusions:  sodium chloride 10 mL/hr at 01/17/21 1736     LOS: 5 days    Cordelia Poche, MD [facility] 01/18/2021, 5:51 PM  If 7PM-7AM, please contact night-coverage [email or web address]

## 2021-01-18 NOTE — Assessment & Plan Note (Signed)
Creatinine of 1.38 on admission with baseline of about 1. Resolved with IV fluids.

## 2021-01-18 NOTE — Assessment & Plan Note (Signed)
Patient with associated hypoxia. Patient managed initially on Ceftriaxone/Azithromycin and has completed course.

## 2021-01-18 NOTE — Progress Notes (Signed)
Occupational Therapy Treatment Patient Details Name: Robert Carpenter D Rayon MRN: [id number] DOB: 05-07-1933 Today's Date: 01/18/2021   History of present illness Patient is a [age] year old male who presented from LTC facility with cough, fever and weakness. patient was found to have sepsis secondary to pneumonia, pulmonary edema. WGN:FAOZHYPMH:atrial fibrillation, hypertension, history of DVT, history of cardiac arrest, history of gastrostomy and tracheostomy   OT comments  Patient supine in bed on 3 L [place]. Patient pleasant and agreeable to get out of bed but not to standing ADLs. Patient supervision to transfer to side of bed and min guard to transfer to recliner without a device. Patient's HR 122 and sat 98%. Performed grooming task in recliner but declined others. Nurse in room and reports "he sounds junky" and therapist had patient use flutter valve x 5 and cough. Cont POC.   Recommendations for follow up therapy are one component of a multi-disciplinary discharge planning process, led by the attending physician.  Recommendations may be updated based on patient status, additional functional criteria and insurance authorization.    Follow Up Recommendations  Long-term institutional care without follow-up therapy    Assistance Recommended at Discharge Frequent or constant Supervision/Assistance  Patient can return home with the following  A little help with walking and/or transfers;A little help with bathing/dressing/bathroom;Direct supervision/assist for medications management;Direct supervision/assist for financial management   Equipment Recommendations  None recommended by OT    Recommendations for Other Services      Precautions / Restrictions Precautions Precautions: Fall Precaution Comments: monitor HR and O2 Restrictions Weight Bearing Restrictions: No       Mobility Bed Mobility                    Transfers                         Balance Overall balance assessment:  Mild deficits observed, not formally tested                                         ADL either performed or assessed with clinical judgement   ADL Overall ADL's : Needs assistance/impaired       Grooming Details (indicate cue type and reason): washed face and hands seated in recliner                               General ADL Comments: Patient supervision to transfer to side of bed and min guard to transfer to recliner.    Extremity/Trunk Assessment              Vision   Vision Assessment?: No apparent visual deficits   Perception     Praxis      Cognition Arousal/Alertness: Awake/alert Behavior During Therapy: WFL for tasks assessed/performed Overall Cognitive Status: No family/caregiver present to determine baseline cognitive functioning                                 General Comments: unreliable historian        Exercises      Shoulder Instructions       General Comments      Pertinent Vitals/ Pain       Pain Assessment Pain Assessment: No/denies pain  Home Living                                          Prior Functioning/Environment              Frequency  Min 2X/week        Progress Toward Goals  OT Goals(current goals can now be found in the care plan section)  Progress towards OT goals: Progressing toward goals  Acute Rehab OT Goals OT Goal Formulation: Patient unable to participate in goal setting Time For Goal Achievement: 01/30/21 Potential to Achieve Goals: [place] Discharge plan remains appropriate    Co-evaluation                 AM-PAC OT "6 Clicks" Daily Activity     Outcome Measure   Help from another person eating meals?: None Help from another person taking care of personal grooming?: A Little Help from another person toileting, which includes using toliet, bedpan, or urinal?: A Lot Help from another person bathing (including washing,  rinsing, drying)?: A Lot Help from another person to put on and taking off regular upper body clothing?: A Lot Help from another person to put on and taking off regular lower body clothing?: A Lot 6 Click Score: 15    End of Session    OT Visit Diagnosis: Unsteadiness on feet (R26.81);Muscle weakness (generalized) (M62.81)   Activity Tolerance Patient tolerated treatment well   Patient Left in chair;with call bell/phone within reach;with chair alarm set   Nurse Communication Mobility status        Time: [phone] OT Time Calculation (min): 9 min  Charges: OT General Charges $OT Visit: 1 Visit OT Treatments $Therapeutic Activity: 8-22 mins  Derl Barrow, OTR/L [place]  Office [phone] Pager: [place] 01/18/2021, 9:43 AM

## 2021-01-18 NOTE — Assessment & Plan Note (Addendum)
Secondary to pneumonia and possible pulmonary edema vs pulmonary infiltrates. Given IV lasix without much improvement. Wean to room air as able. Discharged on 2 L/min of oxygen.

## 2021-01-18 NOTE — Assessment & Plan Note (Addendum)
Slow rate atrial fibrillation, continue with apixaban as tolerated.  

## 2021-01-18 NOTE — TOC Progression Note (Signed)
Transition of Care [facility]) - Progression Note    Patient Details  Name: JAIMERE FEUTZ MRN: [id number] Date of Birth: 03-Mar-1933  [facility]) CM/SW Contact  Golda Acre, RN Phone Number: 01/18/2021, 1:15 PM  Clinical Narrative:    Patient is to return to [facility].  Fl2 completed and sent to [facility].  Patient is a long term resident there.   Expected Discharge Plan: Home/Self Care Barriers to Discharge: Continued Medical Work up  Expected Discharge Plan and Services Expected Discharge Plan: Home/Self Care   Discharge Planning Services: CM Consult   Living arrangements for the past 2 months: Single Family Home                                       Social Determinants of Health (SDOH) Interventions    Readmission Risk Interventions No flowsheet data found.

## 2021-01-18 NOTE — Assessment & Plan Note (Signed)
Initial blood culture significant for Staphylococcus hominis. Patient was managed empirically with Vancomycin IV which was discontinued since culture result was a likely contaminant. Repeat cultures with no growth.

## 2021-01-18 NOTE — NC FL2 (Signed)
[place] MEDICAID FL2 LEVEL OF CARE SCREENING TOOL     IDENTIFICATION  Patient Name: Robert Carpenter Birthdate: [birth year] Sex: male Admission Date (Current Location): 01/12/2021  [facility] and [place] Number:  Herbalist and Address:  [facility],  [address], [place]      Provider Number: [id number]  Attending Physician Name and Address:  Mariel Aloe, MD  Relative Name and Phone Number:       Current Level of Care: Hospital Recommended Level of Care: [place] Prior Approval Number:    Date Approved/Denied:   PASRR Number: [id number]  Discharge Plan: SNF    Current Diagnoses: Patient Active Problem List   Diagnosis Date Noted   Sepsis due to pneumonia ([place]) 01/13/2021   HTN (hypertension) 01/13/2021   AKI (acute kidney injury) ([place]) 01/13/2021   Chronic atrial fibrillation ([place])    COVID-19 virus infection    History of DVT (deep vein thrombosis) 02/19/2020   History of cardiac arrest 02/19/2020    Orientation RESPIRATION BLADDER Height & Weight     Self, Time, Situation, Place (periods of laspe in loc but remains functional)  Normal Continent Weight: 77.7 kg Height:  6\' 2"  (188 cm)  BEHAVIORAL SYMPTOMS/MOOD NEUROLOGICAL BOWEL NUTRITION STATUS      Continent Diet (regular)  AMBULATORY STATUS COMMUNICATION OF NEEDS Skin   Total Care Verbally Normal                       Personal Care Assistance Level of Assistance  Bathing, Feeding, Dressing Bathing Assistance: Limited assistance Feeding assistance: Limited assistance Dressing Assistance: Limited assistance     Functional Limitations Info  Sight, Hearing, Speech Sight Info: Adequate Hearing Info: Adequate Speech Info: Adequate    SPECIAL CARE FACTORS FREQUENCY  PT (By licensed PT), OT (By licensed OT)     PT Frequency: 5 x weekly OT Frequency: 5 x weekly            Contractures Contractures Info: Not present    Additional  Factors Info  Code Status Code Status Info: full             Current Medications (01/18/2021):  This is the current hospital active medication list Current Facility-Administered Medications  Medication Dose Route Frequency Provider Last Rate Last Admin   0.9 %  sodium chloride infusion   Intravenous Continuous Swayze, Ava, DO 10 mL/hr at 01/17/21 1736 Infusion Verify at 01/17/21 1736   acetaminophen (TYLENOL) tablet 650 mg  650 mg Oral Q6H PRN Marcelyn Bruins, MD   650 mg at 01/14/21 2130   Or   acetaminophen (TYLENOL) suppository 650 mg  650 mg Rectal Q6H PRN Marcelyn Bruins, MD       albuterol (PROVENTIL) (2.5 MG/3ML) 0.083% nebulizer solution 2.5 mg  2.5 mg Nebulization BID Swayze, Ava, DO   2.5 mg at 01/18/21 0848   albuterol (PROVENTIL) (2.5 MG/3ML) 0.083% nebulizer solution 2.5 mg  2.5 mg Nebulization Q6H PRN Swayze, Ava, DO   2.5 mg at 01/18/21 0203   apixaban (ELIQUIS) tablet 5 mg  5 mg Oral BID Marcelyn Bruins, MD   5 mg at 01/18/21 0910   diclofenac Sodium (VOLTAREN) 1 % topical gel 2 g  2 g Topical QID Swayze, Ava, DO   2 g at 01/18/21 0910   guaiFENesin (MUCINEX) 12 hr tablet 1,200 mg  1,200 mg Oral BID Swayze, Ava, DO   1,200 mg at 01/18/21  0909   guaiFENesin-dextromethorphan (ROBITUSSIN DM) 100-10 MG/5ML syrup 5 mL  5 mL Oral Q4H PRN Swayze, Ava, DO   5 mL at 01/17/21 0644   ibuprofen (ADVIL) tablet 400 mg  400 mg Oral Q4H PRN Swayze, Ava, DO   400 mg at 01/18/21 1007   lidocaine (XYLOCAINE) 2 % viscous mouth solution 15 mL  15 mL Mouth/Throat Q4H PRN Swayze, Ava, DO   15 mL at 01/16/21 1915   lip balm (CARMEX) ointment   Topical PRN Swayze, Ava, DO       MEDLINE mouth rinse  15 mL Mouth Rinse BID Swayze, Ava, DO   15 mL at 01/17/21 2101   melatonin tablet 3 mg  3 mg Oral QHS Mariel Aloe, MD       mirtazapine (REMERON) tablet 7.5 mg  7.5 mg Oral QHS Swayze, Ava, DO   7.5 mg at 01/17/21 2101   multivitamin with minerals tablet 1 tablet  1 tablet Oral Daily  Swayze, Ava, DO   1 tablet at 01/18/21 0910   polyethylene glycol (MIRALAX / GLYCOLAX) packet 17 g  17 g Oral Daily PRN Marcelyn Bruins, MD   17 g at 01/16/21 2006   senna-docusate (Senokot-S) tablet 2 tablet  2 tablet Oral QHS Swayze, Ava, DO   2 tablet at 01/17/21 2100   sertraline (ZOLOFT) tablet 50 mg  50 mg Oral Daily Swayze, Ava, DO   50 mg at 01/18/21 0910   sodium chloride flush (NS) 0.9 % injection 3 mL  3 mL Intravenous Q12H Marcelyn Bruins, MD   3 mL at 01/17/21 [id number]     Discharge Medications: Please see discharge summary for a list of discharge medications.  Relevant Imaging Results:  Relevant Lab Results:   Additional Information SSN: [id number]  Leeroy Cha, RN

## 2021-01-18 NOTE — Assessment & Plan Note (Signed)
Noted on thyroid ultrasound with recommendation for needle aspiration biopsy.

## 2021-01-19 ENCOUNTER — Inpatient Hospital Stay (HOSPITAL_COMMUNITY): Payer: Medicare Other

## 2021-01-19 LAB — CULTURE, BLOOD (ROUTINE X 2)
Culture: NO GROWTH
Culture: NO GROWTH

## 2021-01-19 LAB — PROCALCITONIN: Procalcitonin: 0.35 ng/mL

## 2021-01-19 LAB — CREATININE, SERUM
Creatinine, Ser: 0.93 mg/dL (ref 0.61–1.24)
GFR, Estimated: >60 mL/min (ref >60)

## 2021-01-19 MED ORDER — FUROSEMIDE 10 MG/ML IJ SOLN
20.0000 mg | Freq: Once | INTRAMUSCULAR | Status: AC
  Administered 2021-01-19: 20 mg via INTRAVENOUS
  Filled 2021-01-19: qty 2

## 2021-01-19 NOTE — Hospital Course (Signed)
Robert Carpenter is a 88 y.o. male with a history of atrial fibrillation, hypertension, DVT, cardiac arrest, gastrostomy/tracheostomy now removed. Patient presented secondary to fever, cough and weakness and found to have evidence of sepsis secondary to pneumonia and associated hypoxia. Antibiotics given with improvement.

## 2021-01-19 NOTE — Care Management Important Message (Signed)
Important Message  Patient Details IM Letter placed in Patients room. Name: Robert Carpenter MRN: [id number] Date of Birth: Mar 19, 1933   Medicare Important Message Given:  Yes     Kerin Salen 01/19/2021, 11:18 AM

## 2021-01-19 NOTE — Progress Notes (Signed)
PROGRESS NOTE    Robert Carpenter  [id number] DOB: 01-21-1933 DOA: 01/12/2021 PCP: Rosaria Ferries, MD   Brief Narrative: Robert Carpenter is a 88 y.o. male with a history of atrial fibrillation, hypertension, DVT, cardiac arrest, gastrostomy/tracheostomy now removed. Patient presented secondary to fever, cough and weakness and found to have evidence of sepsis secondary to pneumonia and associated hypoxia. Antibiotics given with improvement.   Assessment & Plan:   * Sepsis due to pneumonia (HCC)-resolved as of 01/18/2021, (present on admission) Patient with associated hypoxia. Patient managed initially on Ceftriaxone/Azithromycin and has completed course.  Acute respiratory failure with hypoxia (HCC) Secondary to pneumonia and possible pulmonary edema vs pulmonary infiltrates. -Wean to room air as able -Keep SpO2 >90% -Lasix 20 mg IV to see if there is any response  AKI (acute kidney injury) (HCC)-resolved as of 01/18/2021 Creatinine of 1.38 on admission with baseline of about 1. Resolved with IV fluids.  Chronic systolic CHF (congestive heart failure) (HCC) Noted on Transthoracic Echocardiogram. EF of 40-45%. Patient is not on an ACEi/ARB or beta blocker at this time. -Continue Losartan  Pulmonary edema Noted on chest x-ray. Given IV lasix with no significant result.  -Repeat chest x-ray in AM  Thyroid nodule Noted on thyroid ultrasound with recommendation for needle aspiration biopsy.  Chronic atrial fibrillation ([place])- (present on admission) Rate controlled. -Continue Eliquis  HTN (hypertension) Patient is on amlodipine as an outpatient which has been held this admission. Blood pressure uncontrolled. -Continue Losartan secondary to reduced LVEF  Positive blood culture-resolved as of 01/18/2021 Initial blood culture significant for Staphylococcus hominis. Patient was managed empirically with Vancomycin IV which was discontinued since culture result was a  likely contaminant. Repeat cultures with no growth.      DVT prophylaxis: Eliquis Code Status:   Code Status: Full Code Family Communication: None at bedside Disposition Plan: Discharge to SNF likely in 1-3 days pending ability to wean oxygen if able   Consultants:  None  Procedures:  TRANSTHORACIC ECHOCARDIOGRAM  IMPRESSIONS     1. Left ventricular ejection fraction, by estimation, is 40 to 45%. Left  ventricular ejection fraction by 2D MOD biplane is 43.6 %. The left  ventricle has mildly decreased function. The left ventricle demonstrates  global hypokinesis. There is mild left  ventricular hypertrophy. Left ventricular diastolic parameters are  consistent with Grade I diastolic dysfunction (impaired relaxation).   2. Right ventricular systolic function is normal. The right ventricular  size is normal. There is mildly elevated pulmonary artery systolic  pressure. The estimated right ventricular systolic pressure is [id number] mmHg.   3. The mitral valve is grossly normal. Trivial mitral valve  regurgitation.   4. The aortic valve is tricuspid. Aortic valve regurgitation is not  visualized.   5. The inferior vena cava is normal in size with greater than 50%  respiratory variability, suggesting right atrial pressure of 3 mmHg.   6. Rhythm strip during this exam demostrated normal sinus rhythm and 1st  degree AVB.   Antimicrobials: Vancomycin Ceftriaxone Azithromycin    Subjective: A little fatigued, otherwise no concerns.  Objective: Vitals:   01/18/21 1325 01/18/21 2112 01/19/21 0600 01/19/21 1230  BP: (!) 143/74 (!) 148/78 132/73 121/67  Pulse: 68 63 (!) 58 64  Resp: 20 20 16 18   Temp: 97.7 F (36.5 C) 98.1 F (36.7 C) (!) 97.5 F (36.4 C) 98.7 F (37.1 C)  TempSrc: Oral Oral Oral Oral  SpO2: 100% 100% 100% 98%  Weight:  Height:        Intake/Output Summary (Last 24 hours) at 01/19/2021 1607 Last data filed at 01/19/2021 1230 Gross per 24 hour  Intake  363 ml  Output --  Net 363 ml    Filed Weights   01/13/21 1853  Weight: 77.7 kg    Examination:  General exam: Appears calm and comfortable. Sitting in chair. Respiratory system: Rales. Respiratory effort normal. Cardiovascular system: S1 & S2 heard, RRR. No murmurs, rubs, gallops or clicks. Gastrointestinal system: Abdomen is nondistended, soft and nontender. No organomegaly or masses felt. Normal bowel sounds heard. Central nervous system: Alert and oriented. Musculoskeletal: No calf tenderness Skin: No cyanosis. No rashes Psychiatry: Judgement and insight appear normal. Mood & affect appropriate.     Data Reviewed: I have personally reviewed following labs and imaging studies  CBC Lab Results  Component Value Date   WBC 11.6 (H) 01/18/2021   RBC 3.83 (L) 01/18/2021   HGB 11.7 (L) 01/18/2021   HCT 36.1 (L) 01/18/2021   MCV 94.3 01/18/2021   MCH 30.5 01/18/2021   PLT 268 01/18/2021   MCHC 32.4 01/18/2021   RDW 14.3 01/18/2021   LYMPHSABS 2.0 01/18/2021   MONOABS 1.0 01/18/2021   EOSABS 0.6 (H) 01/18/2021   BASOSABS 0.1 [id number]     Last metabolic panel Lab Results  Component Value Date   NA 135 01/18/2021   K 3.8 01/18/2021   CL 97 (L) 01/18/2021   CO2 30 01/18/2021   BUN 12 01/18/2021   CREATININE 0.93 01/19/2021   GLUCOSE 146 (H) 01/18/2021   GFRNONAA >60 01/19/2021   CALCIUM 8.8 (L) 01/18/2021   PHOS 3.7 03/29/2020   PROT 6.8 01/13/2021   ALBUMIN 3.4 (L) 01/13/2021   BILITOT 1.0 01/13/2021   ALKPHOS 115 01/13/2021   AST 48 (H) 01/13/2021   ALT 176 (H) 01/13/2021   ANIONGAP 8 01/18/2021    CBG (last 3)  No results for input(s): GLUCAP in the last 72 hours.   GFR: Estimated Creatinine Clearance: 63.8 mL/min (by C-G formula based on SCr of 0.93 mg/dL).  Coagulation Profile: Recent Labs  Lab 01/13/21 0420  INR 1.6*     Recent Results (from the past 240 hour(s))  Resp Panel by RT-PCR (Flu A&B, Covid) Nasopharyngeal Swab     Status:  None   Collection Time: 01/12/21  7:44 PM   Specimen: Nasopharyngeal Swab; Nasopharyngeal(NP) swabs in vial transport medium  Result Value Ref Range Status   SARS Coronavirus 2 by RT PCR NEGATIVE NEGATIVE Final    Comment: (NOTE) SARS-CoV-2 target nucleic acids are NOT DETECTED.  The SARS-CoV-2 RNA is generally detectable in upper respiratory specimens during the acute phase of infection. The lowest concentration of SARS-CoV-2 viral copies this assay can detect is 138 copies/mL. A negative result does not preclude SARS-Cov-2 infection and should not be used as the sole basis for treatment or other patient management decisions. A negative result may occur with  improper specimen collection/handling, submission of specimen other than nasopharyngeal swab, presence of viral mutation(s) within the areas targeted by this assay, and inadequate number of viral copies(<138 copies/mL). A negative result must be combined with clinical observations, patient history, and epidemiological information. The expected result is Negative.  Fact Sheet for Patients:  EntrepreneurPulse.com.au  Fact Sheet for Healthcare Providers:  IncredibleEmployment.be  This test is no t yet approved or cleared by the [place] FDA and  has been authorized for detection and/or diagnosis of SARS-CoV-2 by FDA under an Emergency  Use Authorization (EUA). This EUA will remain  in effect (meaning this test can be used) for the duration of the COVID-19 declaration under Section 564(b)(1) of the Act, 21 U.S.C.section 360bbb-3(b)(1), unless the authorization is terminated  or revoked sooner.       Influenza A by PCR NEGATIVE NEGATIVE Final   Influenza B by PCR NEGATIVE NEGATIVE Final    Comment: (NOTE) The Xpert Xpress SARS-CoV-2/FLU/RSV plus assay is intended as an aid in the diagnosis of influenza from Nasopharyngeal swab specimens and should not be used as a sole basis for  treatment. Nasal washings and aspirates are unacceptable for Xpert Xpress SARS-CoV-2/FLU/RSV testing.  Fact Sheet for Patients: BloggerCourse.com  Fact Sheet for Healthcare Providers: SeriousBroker.it  This test is not yet approved or cleared by the [place] FDA and has been authorized for detection and/or diagnosis of SARS-CoV-2 by FDA under an Emergency Use Authorization (EUA). This EUA will remain in effect (meaning this test can be used) for the duration of the COVID-19 declaration under Section 564(b)(1) of the Act, 21 U.S.C. section 360bbb-3(b)(1), unless the authorization is terminated or revoked.  Performed at [facility], [address] [place], [place] [address]   Culture, blood (routine x 2)     Status: Abnormal   Collection Time: 01/12/21  8:00 PM   Specimen: BLOOD  Result Value Ref Range Status   Specimen Description   Final    BLOOD RIGHT ANTECUBITAL Performed at [facility], [address] [place], [place] [address]    Special Requests   Final    BOTTLES DRAWN AEROBIC AND ANAEROBIC Blood Culture adequate volume Performed at [facility], [address] [place], [place] [address]    Culture  Setup Time   Final    GRAM POSITIVE COCCI IN CLUSTERS IN BOTH AEROBIC AND ANAEROBIC BOTTLES CRITICAL RESULT CALLED TO, READ BACK BY AND VERIFIED WITH: E WILLIAMSON PHARMD 1702 01/13/21 A BROWNING    Culture (A)  Final    STAPHYLOCOCCUS HOMINIS THE SIGNIFICANCE OF ISOLATING THIS ORGANISM FROM A SINGLE SET OF BLOOD CULTURES WHEN MULTIPLE SETS ARE DRAWN IS UNCERTAIN. PLEASE NOTIFY THE MICROBIOLOGY DEPARTMENT WITHIN ONE WEEK IF SPECIATION AND SENSITIVITIES ARE REQUIRED. Performed at [facility], [address], [place], [place] [address]    Report Status 01/15/2021 FINAL  Final  Blood Culture ID Panel (Reflexed)     Status: Abnormal   Collection Time:  01/12/21  8:00 PM  Result Value Ref Range Status   Enterococcus faecalis NOT DETECTED NOT DETECTED Final   Enterococcus Faecium NOT DETECTED NOT DETECTED Final   Listeria monocytogenes NOT DETECTED NOT DETECTED Final   Staphylococcus species DETECTED (A) NOT DETECTED Final    Comment: CRITICAL RESULT CALLED TO, READ BACK BY AND VERIFIED WITH: Talmadge Chad PHARMD 1702 01/13/21 A BROWNING    Staphylococcus aureus (BCID) NOT DETECTED NOT DETECTED Final   Staphylococcus epidermidis NOT DETECTED NOT DETECTED Final   Staphylococcus lugdunensis NOT DETECTED NOT DETECTED Final   Streptococcus species NOT DETECTED NOT DETECTED Final   Streptococcus agalactiae NOT DETECTED NOT DETECTED Final   Streptococcus pneumoniae NOT DETECTED NOT DETECTED Final   Streptococcus pyogenes NOT DETECTED NOT DETECTED Final   A.calcoaceticus-baumannii NOT DETECTED NOT DETECTED Final   Bacteroides fragilis NOT DETECTED NOT DETECTED Final   Enterobacterales NOT DETECTED NOT DETECTED Final   Enterobacter cloacae complex NOT DETECTED NOT DETECTED Final   Escherichia coli NOT DETECTED NOT DETECTED Final  Klebsiella aerogenes NOT DETECTED NOT DETECTED Final   Klebsiella oxytoca NOT DETECTED NOT DETECTED Final   Klebsiella pneumoniae NOT DETECTED NOT DETECTED Final   Proteus species NOT DETECTED NOT DETECTED Final   Salmonella species NOT DETECTED NOT DETECTED Final   Serratia marcescens NOT DETECTED NOT DETECTED Final   Haemophilus influenzae NOT DETECTED NOT DETECTED Final   Neisseria meningitidis NOT DETECTED NOT DETECTED Final   Pseudomonas aeruginosa NOT DETECTED NOT DETECTED Final   Stenotrophomonas maltophilia NOT DETECTED NOT DETECTED Final   Candida albicans NOT DETECTED NOT DETECTED Final   Candida auris NOT DETECTED NOT DETECTED Final   Candida glabrata NOT DETECTED NOT DETECTED Final   Candida krusei NOT DETECTED NOT DETECTED Final   Candida parapsilosis NOT DETECTED NOT DETECTED Final   Candida  tropicalis NOT DETECTED NOT DETECTED Final   Cryptococcus neoformans/gattii NOT DETECTED NOT DETECTED Final    Comment: Performed at [facility], [place] [address] [place], [place] [address]  Culture, blood (routine x 2)     Status: None   Collection Time: 01/12/21  9:53 PM   Specimen: BLOOD  Result Value Ref Range Status   Specimen Description   Final    BLOOD BLOOD RIGHT FOREARM Performed at [facility] [address] [place], [place] [address]    Special Requests   Final    BOTTLES DRAWN AEROBIC AND ANAEROBIC Blood Culture adequate volume Performed at [facility] [address], [place], [place] [address]    Culture   Final    NO GROWTH 5 DAYS Performed at [facility], [place] [address], [place], [place] [address]    Report Status 01/18/2021 FINAL  Final  Culture, blood (routine x 2)     Status: None   Collection Time: 01/13/21  8:03 PM   Specimen: BLOOD  Result Value Ref Range Status   Specimen Description   Final    BLOOD BLOOD RIGHT HAND Performed at [facility] [address], [place], [place] [address]    Special Requests   Final    BOTTLES DRAWN AEROBIC ONLY Blood Culture results may not be optimal due to an inadequate volume of blood received in culture bottles Performed at [facility] [address] [place], [place] [address]    Culture   Final    NO GROWTH 5 DAYS Performed at [facility], [place] [address] [place], [place] [address]    Report Status 01/19/2021 FINAL  Final  Culture, blood (routine x 2)     Status: None   Collection Time: 01/13/21  8:03 PM   Specimen: BLOOD  Result Value Ref Range Status   Specimen Description   Final    BLOOD RIGHT ANTECUBITAL Performed at [facility] [address], [place], [place] [address]    Special Requests   Final    BOTTLES DRAWN AEROBIC ONLY Blood Culture results may not be optimal due to an  inadequate volume of blood received in culture bottles Performed at [facility] [address], [place], [place] [address]    Culture   Final    NO GROWTH 5 DAYS Performed at [facility], [place] [address] [place], [place] [address]    Report Status 01/19/2021 FINAL  Final  Culture, blood (routine x 2)     Status: None (Preliminary result)   Collection Time: 01/14/21  4:56 PM   Specimen: BLOOD LEFT  HAND  Result Value Ref Range Status   Specimen Description   Final    BLOOD LEFT HAND Performed at [facility] [address] [place], [place] [address]    Special Requests   Final    BOTTLES DRAWN AEROBIC ONLY Blood Culture adequate volume Performed at [facility] [address] [place], [place] [address]    Culture   Final    NO GROWTH 4 DAYS Performed at [facility], [place] [address], [place], [place] [address]    Report Status PENDING  Incomplete  Culture, blood (routine x 2)     Status: None (Preliminary result)   Collection Time: 01/14/21  5:01 PM   Specimen: Left Antecubital; Blood  Result Value Ref Range Status   Specimen Description   Final    LEFT ANTECUBITAL Performed at [facility] [address] [place], [place] [address]    Special Requests   Final    BOTTLES DRAWN AEROBIC ONLY Blood Culture adequate volume Performed at [facility] [address] [place], [place] [address]    Culture   Final    NO GROWTH 4 DAYS Performed at [facility], [place] [address], [place], [place] [address]    Report Status PENDING  Incomplete  MRSA Next Gen by PCR, Nasal     Status: None   Collection Time: 01/16/21 10:26 AM   Specimen: Nasal Mucosa; Nasal Swab  Result Value Ref Range Status   MRSA by PCR Next Gen NOT DETECTED NOT DETECTED Final    Comment: (NOTE) The GeneXpert MRSA Assay (FDA approved for NASAL specimens only), is one component of a comprehensive  MRSA colonization surveillance program. It is not intended to diagnose MRSA infection nor to guide or monitor treatment for MRSA infections. Test performance is not FDA approved in patients less than 60 years old. Performed at [facility], [place] [address], [place], [place] [address]          Radiology Studies: DG CHEST PORT 1 VIEW  Result Date: 01/19/2021 CLINICAL DATA:  Hypoxia EXAM: PORTABLE CHEST 1 VIEW COMPARISON:  Chest radiograph 01/14/2021 FINDINGS: The heart is enlarged, unchanged. The upper mediastinal contours are prominent, unchanged. Rightward tracheal deviation is unchanged. Lung volumes are low. Patchy opacities throughout the right lung are stable to slightly worsened in the interim. Linear opacities in the left base are unchanged. The left upper lobe remains well-aerated. There is no significant pleural effusion. There is no pneumothorax. The bones are stable. Enteric contrast is seen in the left upper quadrant. IMPRESSION: 1. Patchy opacities throughout the right lung are overall stable to slightly worsened in the interim. Linear opacity in the left base are also unchanged and favored to reflect atelectasis. 2. Unchanged rightward tracheal deviation. Electronically Signed   By: Valetta Mole M.D.   On: 01/19/2021 08:05        Scheduled Meds:  albuterol  2.5 mg Nebulization BID   apixaban  5 mg Oral BID   diclofenac Sodium  2 g Topical QID   furosemide  20 mg Intravenous Once   guaiFENesin  1,200 mg Oral BID   losartan  25 mg Oral Daily   mouth rinse  15 mL Mouth Rinse BID   melatonin  3 mg Oral QHS   mirtazapine  7.5 mg Oral QHS   multivitamin with minerals  1 tablet Oral Daily   senna-docusate  2 tablet Oral QHS  sertraline  50 mg Oral Daily   sodium chloride flush  3 mL Intravenous Q12H   Continuous Infusions:  sodium chloride 10 mL/hr at 01/17/21 1736     LOS: 6 days    Cordelia Poche, MD [facility] 01/19/2021, 4:07 PM  If  7PM-7AM, please contact night-coverage [email or web address]

## 2021-01-19 NOTE — Plan of Care (Signed)
  Problem: Activity: Goal: Risk for activity intolerance will decrease Outcome: Progressing   Problem: Nutrition: Goal: Adequate nutrition will be maintained Outcome: Progressing   Problem: Coping: Goal: Level of anxiety will decrease Outcome: Progressing   

## 2021-01-20 LAB — CULTURE, BLOOD (ROUTINE X 2)
Culture: NO GROWTH
Culture: NO GROWTH
Special Requests: ADEQUATE
Special Requests: ADEQUATE

## 2021-01-20 LAB — CREATININE, SERUM
Creatinine, Ser: 1.07 mg/dL (ref 0.61–1.24)
GFR, Estimated: >60 mL/min (ref >60)

## 2021-01-20 NOTE — Progress Notes (Signed)
PROGRESS NOTE    Robert Carpenter  [id number] DOB: 11-Oct-1933 DOA: 01/12/2021 PCP: Rosaria Ferries, MD   Brief Narrative: Robert Carpenter is a 88 y.o. male with a history of atrial fibrillation, hypertension, DVT, cardiac arrest, gastrostomy/tracheostomy now removed. Patient presented secondary to fever, cough and weakness and found to have evidence of sepsis secondary to pneumonia and associated hypoxia. Antibiotics given with improvement.   Assessment & Plan:   * Sepsis due to pneumonia (HCC)-resolved as of 01/18/2021, (present on admission) Patient with associated hypoxia. Patient managed initially on Ceftriaxone/Azithromycin and has completed course.  Acute respiratory failure with hypoxia (HCC) Secondary to pneumonia and possible pulmonary edema vs pulmonary infiltrates. Given IV lasix; unsure if contributing to improvement. -Wean to room air as able -Keep SpO2 >90%  AKI (acute kidney injury) (HCC)-resolved as of 01/18/2021 Creatinine of 1.38 on admission with baseline of about 1. Resolved with IV fluids.  Chronic systolic CHF (congestive heart failure) (HCC) Noted on Transthoracic Echocardiogram. EF of 40-45%. Patient is not on an ACEi/ARB or beta blocker at this time. -Continue Losartan  Pulmonary edema Noted on chest x-ray. Given IV lasix with no significant result. Opacity likely related to pneumonia.  Thyroid nodule Noted on thyroid ultrasound with recommendation for needle aspiration biopsy.  Chronic atrial fibrillation ([place])- (present on admission) Rate controlled. -Continue Eliquis  HTN (hypertension) Patient is on amlodipine as an outpatient which has been held this admission. Blood pressure uncontrolled. -Continue Losartan secondary to reduced LVEF  Positive blood culture-resolved as of 01/18/2021 Initial blood culture significant for Staphylococcus hominis. Patient was managed empirically with Vancomycin IV which was discontinued since culture  result was a likely contaminant. Repeat cultures with no growth.     DVT prophylaxis: Eliquis Code Status:   Code Status: Full Code Family Communication: None at bedside Disposition Plan: Discharge to SNF. Medically stable.   Consultants:  None  Procedures:  TRANSTHORACIC ECHOCARDIOGRAM  IMPRESSIONS     1. Left ventricular ejection fraction, by estimation, is 40 to 45%. Left  ventricular ejection fraction by 2D MOD biplane is 43.6 %. The left  ventricle has mildly decreased function. The left ventricle demonstrates  global hypokinesis. There is mild left  ventricular hypertrophy. Left ventricular diastolic parameters are  consistent with Grade I diastolic dysfunction (impaired relaxation).   2. Right ventricular systolic function is normal. The right ventricular  size is normal. There is mildly elevated pulmonary artery systolic  pressure. The estimated right ventricular systolic pressure is [id number] mmHg.   3. The mitral valve is grossly normal. Trivial mitral valve  regurgitation.   4. The aortic valve is tricuspid. Aortic valve regurgitation is not  visualized.   5. The inferior vena cava is normal in size with greater than 50%  respiratory variability, suggesting right atrial pressure of 3 mmHg.   6. Rhythm strip during this exam demostrated normal sinus rhythm and 1st  degree AVB.   Antimicrobials: Vancomycin Ceftriaxone Azithromycin    Subjective: No issues this morning.  Objective: Vitals:   01/20/21 0416 01/20/21 0743 01/20/21 0800 01/20/21 1203  BP:    127/72  Pulse:    62  Resp:    16  Temp:    98.1 F (36.7 C)  TempSrc:    Oral  SpO2: 92% 92% 100% 100%  Weight:      Height:        Intake/Output Summary (Last 24 hours) at 01/20/2021 1437 Last data filed at 01/20/2021 0247 Gross per 24 hour  Intake 240 ml  Output 1425 ml  Net -1185 ml    Filed Weights   01/13/21 1853  Weight: 77.7 kg    Examination:  General exam: Appears calm and  comfortable Respiratory system: Right sided crackles/rales. Respiratory effort normal. Cardiovascular system: S1 & S2 heard. Normal rate. Gastrointestinal system: Abdomen is nondistended, soft and nontender. No organomegaly or masses felt. Normal bowel sounds heard. Central nervous system: Alert and oriented. No focal neurological deficits. Musculoskeletal: No edema. No calf tenderness Skin: No cyanosis. No rashes Psychiatry: Judgement and insight appear normal. Mood & affect appropriate.    Data Reviewed: I have personally reviewed following labs and imaging studies  CBC Lab Results  Component Value Date   WBC 11.6 (H) 01/18/2021   RBC 3.83 (L) 01/18/2021   HGB 11.7 (L) 01/18/2021   HCT 36.1 (L) 01/18/2021   MCV 94.3 01/18/2021   MCH 30.5 01/18/2021   PLT 268 01/18/2021   MCHC 32.4 01/18/2021   RDW 14.3 01/18/2021   LYMPHSABS 2.0 01/18/2021   MONOABS 1.0 01/18/2021   EOSABS 0.6 (H) 01/18/2021   BASOSABS 0.1 [id number]     Last metabolic panel Lab Results  Component Value Date   NA 135 01/18/2021   K 3.8 01/18/2021   CL 97 (L) 01/18/2021   CO2 30 01/18/2021   BUN 12 01/18/2021   CREATININE 1.07 01/20/2021   GLUCOSE 146 (H) 01/18/2021   GFRNONAA >60 01/20/2021   CALCIUM 8.8 (L) 01/18/2021   PHOS 3.7 03/29/2020   PROT 6.8 01/13/2021   ALBUMIN 3.4 (L) 01/13/2021   BILITOT 1.0 01/13/2021   ALKPHOS 115 01/13/2021   AST 48 (H) 01/13/2021   ALT 176 (H) 01/13/2021   ANIONGAP 8 01/18/2021    CBG (last 3)  No results for input(s): GLUCAP in the last 72 hours.   GFR: Estimated Creatinine Clearance: 55.5 mL/min (by C-G formula based on SCr of 1.07 mg/dL).   Recent Results (from the past 240 hour(s))  Resp Panel by RT-PCR (Flu A&B, Covid) Nasopharyngeal Swab     Status: None   Collection Time: 01/12/21  7:44 PM   Specimen: Nasopharyngeal Swab; Nasopharyngeal(NP) swabs in vial transport medium  Result Value Ref Range Status   SARS Coronavirus 2 by RT PCR NEGATIVE  NEGATIVE Final    Comment: (NOTE) SARS-CoV-2 target nucleic acids are NOT DETECTED.  The SARS-CoV-2 RNA is generally detectable in upper respiratory specimens during the acute phase of infection. The lowest concentration of SARS-CoV-2 viral copies this assay can detect is 138 copies/mL. A negative result does not preclude SARS-Cov-2 infection and should not be used as the sole basis for treatment or other patient management decisions. A negative result may occur with  improper specimen collection/handling, submission of specimen other than nasopharyngeal swab, presence of viral mutation(s) within the areas targeted by this assay, and inadequate number of viral copies(<138 copies/mL). A negative result must be combined with clinical observations, patient history, and epidemiological information. The expected result is Negative.  Fact Sheet for Patients:  EntrepreneurPulse.com.au  Fact Sheet for Healthcare Providers:  IncredibleEmployment.be  This test is no t yet approved or cleared by the [place] FDA and  has been authorized for detection and/or diagnosis of SARS-CoV-2 by FDA under an Emergency Use Authorization (EUA). This EUA will remain  in effect (meaning this test can be used) for the duration of the COVID-19 declaration under Section 564(b)(1) of the Act, 21 U.S.C.section 360bbb-3(b)(1), unless the authorization is terminated  or revoked sooner.  Influenza A by PCR NEGATIVE NEGATIVE Final   Influenza B by PCR NEGATIVE NEGATIVE Final    Comment: (NOTE) The Xpert Xpress SARS-CoV-2/FLU/RSV plus assay is intended as an aid in the diagnosis of influenza from Nasopharyngeal swab specimens and should not be used as a sole basis for treatment. Nasal washings and aspirates are unacceptable for Xpert Xpress SARS-CoV-2/FLU/RSV testing.  Fact Sheet for Patients: EntrepreneurPulse.com.au  Fact Sheet for Healthcare  Providers: IncredibleEmployment.be  This test is not yet approved or cleared by the [place] FDA and has been authorized for detection and/or diagnosis of SARS-CoV-2 by FDA under an Emergency Use Authorization (EUA). This EUA will remain in effect (meaning this test can be used) for the duration of the COVID-19 declaration under Section 564(b)(1) of the Act, 21 U.S.C. section 360bbb-3(b)(1), unless the authorization is terminated or revoked.  Performed at [facility], [place] [address] [place], [place] [address]   Culture, blood (routine x 2)     Status: Abnormal   Collection Time: 01/12/21  8:00 PM   Specimen: BLOOD  Result Value Ref Range Status   Specimen Description   Final    BLOOD RIGHT ANTECUBITAL Performed at [facility] [address] [place], [place] [address]    Special Requests   Final    BOTTLES DRAWN AEROBIC AND ANAEROBIC Blood Culture adequate volume Performed at [facility] [address], [place], [place] [address]    Culture  Setup Time   Final    GRAM POSITIVE COCCI IN CLUSTERS IN BOTH AEROBIC AND ANAEROBIC BOTTLES CRITICAL RESULT CALLED TO, READ BACK BY AND VERIFIED WITH: E WILLIAMSON PHARMD 1702 01/13/21 A BROWNING    Culture (A)  Final    STAPHYLOCOCCUS HOMINIS THE SIGNIFICANCE OF ISOLATING THIS ORGANISM FROM A SINGLE SET OF BLOOD CULTURES WHEN MULTIPLE SETS ARE DRAWN IS UNCERTAIN. PLEASE NOTIFY THE MICROBIOLOGY DEPARTMENT WITHIN ONE WEEK IF SPECIATION AND SENSITIVITIES ARE REQUIRED. Performed at [facility], [place] [address] [place],  [address]    Report Status 01/15/2021 FINAL  Final  Blood Culture ID Panel (Reflexed)     Status: Abnormal   Collection Time: 01/12/21  8:00 PM  Result Value Ref Range Status   Enterococcus faecalis NOT DETECTED NOT DETECTED Final   Enterococcus Faecium NOT DETECTED NOT DETECTED Final   Listeria monocytogenes NOT DETECTED NOT  DETECTED Final   Staphylococcus species DETECTED (A) NOT DETECTED Final    Comment: CRITICAL RESULT CALLED TO, READ BACK BY AND VERIFIED WITH: Cristopher Estimable PHARMD 1702 01/13/21 A BROWNING    Staphylococcus aureus (BCID) NOT DETECTED NOT DETECTED Final   Staphylococcus epidermidis NOT DETECTED NOT DETECTED Final   Staphylococcus lugdunensis NOT DETECTED NOT DETECTED Final   Streptococcus species NOT DETECTED NOT DETECTED Final   Streptococcus agalactiae NOT DETECTED NOT DETECTED Final   Streptococcus pneumoniae NOT DETECTED NOT DETECTED Final   Streptococcus pyogenes NOT DETECTED NOT DETECTED Final   A.calcoaceticus-baumannii NOT DETECTED NOT DETECTED Final   Bacteroides fragilis NOT DETECTED NOT DETECTED Final   Enterobacterales NOT DETECTED NOT DETECTED Final   Enterobacter cloacae complex NOT DETECTED NOT DETECTED Final   Escherichia coli NOT DETECTED NOT DETECTED Final   Klebsiella aerogenes NOT DETECTED NOT DETECTED Final   Klebsiella oxytoca NOT DETECTED NOT DETECTED Final   Klebsiella pneumoniae NOT DETECTED NOT DETECTED Final   Proteus species NOT DETECTED NOT DETECTED Final   Salmonella species NOT DETECTED NOT DETECTED Final   Serratia marcescens  NOT DETECTED NOT DETECTED Final   Haemophilus influenzae NOT DETECTED NOT DETECTED Final   Neisseria meningitidis NOT DETECTED NOT DETECTED Final   Pseudomonas aeruginosa NOT DETECTED NOT DETECTED Final   Stenotrophomonas maltophilia NOT DETECTED NOT DETECTED Final   Candida albicans NOT DETECTED NOT DETECTED Final   Candida auris NOT DETECTED NOT DETECTED Final   Candida glabrata NOT DETECTED NOT DETECTED Final   Candida krusei NOT DETECTED NOT DETECTED Final   Candida parapsilosis NOT DETECTED NOT DETECTED Final   Candida tropicalis NOT DETECTED NOT DETECTED Final   Cryptococcus neoformans/gattii NOT DETECTED NOT DETECTED Final    Comment: Performed at [facility], [place] [address], [place], [place] [address]  Culture, blood  (routine x 2)     Status: None   Collection Time: 01/12/21  9:53 PM   Specimen: BLOOD  Result Value Ref Range Status   Specimen Description   Final    BLOOD BLOOD RIGHT FOREARM Performed at [facility] [address], [place], [place] [address]    Special Requests   Final    BOTTLES DRAWN AEROBIC AND ANAEROBIC Blood Culture adequate volume Performed at [facility] [address], [place], [place] [address]    Culture   Final    NO GROWTH 5 DAYS Performed at [facility], [place] [address], [place], [place] [address]    Report Status 01/18/2021 FINAL  Final  Culture, blood (routine x 2)     Status: None   Collection Time: 01/13/21  8:03 PM   Specimen: BLOOD  Result Value Ref Range Status   Specimen Description   Final    BLOOD BLOOD RIGHT HAND Performed at [facility] [address], [place], [place] [address]    Special Requests   Final    BOTTLES DRAWN AEROBIC ONLY Blood Culture results may not be optimal due to an inadequate volume of blood received in culture bottles Performed at [facility] [address], [place], [place] [address]    Culture   Final    NO GROWTH 5 DAYS Performed at [facility], [place] [address], [place], [place] [address]    Report Status 01/19/2021 FINAL  Final  Culture, blood (routine x 2)     Status: None   Collection Time: 01/13/21  8:03 PM   Specimen: BLOOD  Result Value Ref Range Status   Specimen Description   Final    BLOOD RIGHT ANTECUBITAL Performed at [facility] [address], [place], [place] [address]    Special Requests   Final    BOTTLES DRAWN AEROBIC ONLY Blood Culture results may not be optimal due to an inadequate volume of blood received in culture bottles Performed at [facility] [address] [place], [place] [address]    Culture   Final    NO GROWTH 5 DAYS Performed at [place], [place] [address], [place], [place] [address]    Report Status 01/19/2021 FINAL  Final  Culture, blood (routine x 2)     Status: None   Collection Time: 01/14/21  4:56 PM   Specimen: BLOOD LEFT HAND  Result Value Ref Range Status   Specimen Description   Final    BLOOD LEFT HAND Performed at [facility] [address] [place],  [address]    Special Requests   Final    BOTTLES DRAWN AEROBIC ONLY Blood  Culture adequate volume Performed at [facility] [address] [place], [place] [address]    Culture   Final    NO GROWTH 5 DAYS Performed at [facility], [place] [address] [place], [place] [address]    Report Status 01/20/2021 FINAL  Final  Culture, blood (routine x 2)     Status: None   Collection Time: 01/14/21  5:01 PM   Specimen: Left Antecubital; Blood  Result Value Ref Range Status   Specimen Description   Final    LEFT ANTECUBITAL Performed at [facility] [address], [place], [place] [address]    Special Requests   Final    BOTTLES DRAWN AEROBIC ONLY Blood Culture adequate volume Performed at [facility] [address] [place], [place] [address]    Culture   Final    NO GROWTH 5 DAYS Performed at [facility], [place] [address] [place], [place] [address]    Report Status 01/20/2021 FINAL  Final  MRSA Next Gen by PCR, Nasal     Status: None   Collection Time: 01/16/21 10:26 AM   Specimen: Nasal Mucosa; Nasal Swab  Result Value Ref Range Status   MRSA by PCR Next Gen NOT DETECTED NOT DETECTED Final    Comment: (NOTE) The GeneXpert MRSA Assay (FDA approved for NASAL specimens only), is one component of a comprehensive MRSA colonization surveillance program. It is not intended to diagnose MRSA infection nor to guide or monitor treatment for MRSA infections. Test performance is not FDA approved in patients less than 73 years old. Performed at [facility], [place] [address], [place], [place] [address]          Radiology Studies: DG CHEST PORT 1 VIEW  Result Date: 01/19/2021 CLINICAL DATA:  Hypoxia EXAM: PORTABLE CHEST 1 VIEW COMPARISON:  Chest radiograph 01/14/2021 FINDINGS: The heart is enlarged, unchanged. The upper mediastinal contours are prominent, unchanged. Rightward tracheal deviation is unchanged. Lung volumes are low. Patchy opacities throughout the right lung are stable to slightly worsened in the interim. Linear opacities in the left base are unchanged. The left upper lobe remains well-aerated. There is no significant pleural effusion. There is no pneumothorax. The bones are stable. Enteric contrast is seen in the left upper quadrant. IMPRESSION: 1. Patchy opacities throughout the right lung are overall stable to slightly worsened in the interim. Linear opacity in the left base are also unchanged and favored to reflect atelectasis. 2. Unchanged rightward tracheal deviation. Electronically Signed   By: Valetta Mole M.D.   On: 01/19/2021 08:05        Scheduled Meds:  albuterol  2.5 mg Nebulization BID   apixaban  5 mg Oral BID   diclofenac Sodium  2 g Topical QID   guaiFENesin  1,200 mg Oral BID   losartan  25 mg Oral Daily   mouth rinse  15 mL Mouth Rinse BID   melatonin  3 mg Oral QHS   mirtazapine  7.5 mg Oral QHS   multivitamin with minerals  1 tablet Oral Daily   senna-docusate  2 tablet Oral QHS   sertraline  50 mg Oral Daily   sodium chloride flush  3 mL Intravenous Q12H   Continuous Infusions:  sodium chloride 10 mL/hr at 01/17/21 1736     LOS: 7 days    Cordelia Poche, MD [facility] 01/20/2021, 2:37 PM  If 7PM-7AM, please contact night-coverage [email or web address]

## 2021-01-20 NOTE — Progress Notes (Signed)
SATURATION QUALIFICATIONS: (This note is used to comply with regulatory documentation for home oxygen)  Patient Saturations on Room Air at Rest = 100%  Patient Saturations on Room Air while Ambulating = 81%  Patient Saturations on 3 Liters of oxygen while Ambulating = 91%  Sinclair Ship, RN

## 2021-01-21 LAB — CREATININE, SERUM
Creatinine, Ser: 1.19 mg/dL (ref 0.61–1.24)
GFR, Estimated: 60 mL/min — ABNORMAL LOW (ref >60)

## 2021-01-21 MED ORDER — LOSARTAN POTASSIUM 25 MG PO TABS
12.5000 mg | ORAL_TABLET | Freq: Every day | ORAL | Status: DC
  Administered 2021-01-22 – 2021-01-23 (×2): 12.5 mg via ORAL
  Filled 2021-01-21 (×2): qty 1

## 2021-01-21 NOTE — Progress Notes (Signed)
Physical Therapy Treatment Patient Details Name: Robert Carpenter MRN: [id number] DOB: 06-01-1933 Today's Date: 01/21/2021   History of Present Illness Patient is a 88 year old male who presented from LTC facility with cough, fever and weakness. patient was found to have sepsis secondary to pneumonia, pulmonary edema. CNO:[id number] fibrillation, hypertension, history of DVT, history of cardiac arrest, history of gastrostomy and tracheostomy    PT Comments    Pt currently in bed receiving a bed bath on 3 lts O2.  Initially he declined to get up and tends to use humor and charm to express himself.  General Comments: AxO x 2 following all commands but not able to recall his current medical Dx.  Also required MAX encouragement to get OOB. Pt uses humor as his first response. With Student RN assisted OOB to attempt to amb was unsuccessful due to drop in his BP. Asymptomatic.  Supine           BP 106/54  HR 61 3 lts 100% EOB               BP 96/60    HR 65   RA 95% Standing        BP 82/48    HR 66    RA 85%  max c/o weakness and wanting to lay back down but instead assisted to recliner.  Increased cough/congestion/clear yellow mucous with mild dyspnea. Positioned in recliner with multiple pillows and reapplied 3 lts nasal.   Pt will need to return to SNF to increase his mobility and strength.  SATURATION QUALIFICATIONS: (This note is used to comply with regulatory documentation for home oxygen)  Patient Saturations on Room Air at Rest = 95%  Patient Saturations on Room Air while standing = 85%  Patient Saturations on 3 Liters of oxygen while Ambulating =92%  Please briefly explain why patient needs home oxygen:  Pt requires supplemental oxygen to achieve therapeutic levels.   Recommendations for follow up therapy are one component of a multi-disciplinary discharge planning process, led by the attending physician.  Recommendations may be updated based on patient status, additional functional  criteria and insurance authorization.  Follow Up Recommendations  Skilled nursing-short term rehab (<3 hours/day)     Assistance Recommended at Discharge    Patient can return home with the following     Equipment Recommendations       Recommendations for Other Services       Precautions / Restrictions Precautions Precautions: Fall Precaution Comments: monitor HR and O2     Mobility  Bed Mobility Overal bed mobility: Needs Assistance Bed Mobility: Supine to Sit     Supine to sit: Supervision     General bed mobility comments: Pt self able with HOB elevated and use of rail.    Transfers Overall transfer level: Needs assistance   Transfers: Sit to/from Stand Sit to Stand: Min assist           General transfer comment: 50% VC's on proper hand placement and increased time with c/o feeling "tired".  Performed orthostatic vitals as a precaution.  pt was hypotensive but with minimal symptoms.  Pt does repeat, "I'm good" but did state he was "tired"    Ambulation/Gait               General Gait Details: Not able to attempt amb due to drop in BP.  Only assisted to recliner   Stairs  Wheelchair Mobility    Modified Rankin (Stroke Patients Only)       Balance                                            Cognition Arousal/Alertness: Awake/alert Behavior During Therapy: WFL for tasks assessed/performed Overall Cognitive Status: Within Functional Limits for tasks assessed                                 General Comments: AxO x 2 following all commands but not able to recall his current medical Dx.  Also required MAX encouragement to get OOB. Pt uses humor as his first response.        Exercises      General Comments        Pertinent Vitals/Pain Pain Assessment Pain Assessment: No/denies pain    Home Living                          Prior Function            PT Goals (current  goals can now be found in the care plan section) Progress towards PT goals: Progressing toward goals    Frequency    Min 2X/week      PT Plan Current plan remains appropriate    Co-evaluation              AM-PAC PT "6 Clicks" Mobility   Outcome Measure  Help needed turning from your back to your side while in a flat bed without using bedrails?: A Little Help needed moving from lying on your back to sitting on the side of a flat bed without using bedrails?: A Little Help needed moving to and from a bed to a chair (including a wheelchair)?: A Little Help needed standing up from a chair using your arms (e.g., wheelchair or bedside chair)?: A Little Help needed to walk in hospital room?: A Lot Help needed climbing 3-5 steps with a railing? : A Lot 6 Click Score: 16    End of Session Equipment Utilized During Treatment: Gait belt Activity Tolerance: Other (comment) Patient left:  (hypotensive) Nurse Communication: Mobility status PT Visit Diagnosis: Muscle weakness (generalized) (M62.81);Difficulty in walking, not elsewhere classified (R26.2);Unsteadiness on feet (R26.81)     Time: 2458-0998 PT Time Calculation (min) (ACUTE ONLY): 31 min  Charges:  $Therapeutic Activity: 23-37 mins                     {Louise Victory  PTA Acute  Rehabilitation Services Pager      [phone] Office      [phone]

## 2021-01-21 NOTE — Progress Notes (Addendum)
PROGRESS NOTE    Robert Carpenter  [id number] DOB: [birth year] DOA: 01/12/2021 PCP: Rosaria Ferries, MD   Brief Narrative: Robert Carpenter is a 88 y.o. male with a history of atrial fibrillation, hypertension, DVT, cardiac arrest, gastrostomy/tracheostomy now removed. Patient presented secondary to fever, cough and weakness and found to have evidence of sepsis secondary to pneumonia and associated hypoxia. Antibiotics given with improvement.   Assessment & Plan:   * Sepsis due to pneumonia (HCC)-resolved as of 01/18/2021, (present on admission) Patient with associated hypoxia. Patient managed initially on Ceftriaxone/Azithromycin and has completed course.  Acute respiratory failure with hypoxia (HCC) Secondary to pneumonia and possible pulmonary edema vs pulmonary infiltrates. Given IV lasix; unsure if contributing to improvement. -Wean to room air as able -Keep SpO2 >90%  AKI (acute kidney injury) (HCC)-resolved as of 01/18/2021 Creatinine of 1.38 on admission with baseline of about 1. Resolved with IV fluids.  Chronic systolic CHF (congestive heart failure) (HCC) Noted on Transthoracic Echocardiogram. EF of 40-45%. Patient is not on an ACEi/ARB or beta blocker at this time. -Continue Losartan  Pulmonary edema Noted on chest x-ray. Given IV lasix with no significant result. Opacity likely related to pneumonia.  Thyroid nodule Noted on thyroid ultrasound with recommendation for needle aspiration biopsy.  Chronic atrial fibrillation ([place])- (present on admission) Rate controlled. -Continue Eliquis  HTN (hypertension) Patient is on amlodipine as an outpatient which has been held this admission. Blood pressure uncontrolled. Blood pressure low with standing. Patient symptomatic with PT. -Decrease to Losartan 12.5 mg daily  Positive blood culture-resolved as of 01/18/2021 Initial blood culture significant for Staphylococcus hominis. Patient was managed empirically  with Vancomycin IV which was discontinued since culture result was a likely contaminant. Repeat cultures with no growth.     DVT prophylaxis: Eliquis Code Status:   Code Status: Full Code Family Communication: None at bedside Disposition Plan: Discharge to SNF likely in 1-2 days pending improvement of hypotension   Consultants:  None  Procedures:  TRANSTHORACIC ECHOCARDIOGRAM  IMPRESSIONS     1. Left ventricular ejection fraction, by estimation, is 40 to 45%. Left  ventricular ejection fraction by 2D MOD biplane is 43.6 %. The left  ventricle has mildly decreased function. The left ventricle demonstrates  global hypokinesis. There is mild left  ventricular hypertrophy. Left ventricular diastolic parameters are  consistent with Grade I diastolic dysfunction (impaired relaxation).   2. Right ventricular systolic function is normal. The right ventricular  size is normal. There is mildly elevated pulmonary artery systolic  pressure. The estimated right ventricular systolic pressure is [id number] mmHg.   3. The mitral valve is grossly normal. Trivial mitral valve  regurgitation.   4. The aortic valve is tricuspid. Aortic valve regurgitation is not  visualized.   5. The inferior vena cava is normal in size with greater than 50%  respiratory variability, suggesting right atrial pressure of 3 mmHg.   6. Rhythm strip during this exam demostrated normal sinus rhythm and 1st  degree AVB.   Antimicrobials: Vancomycin Ceftriaxone Azithromycin    Subjective: Some fatigue with PT this morning in addition to low blood pressure while standing.  Objective: Vitals:   01/20/21 1203 01/20/21 2027 01/21/21 0546 01/21/21 0746  BP: 127/72 113/61 (!) 111/100   Pulse: 62 (!) 59 62   Resp: 16 20 (!) 21   Temp: 98.1 F (36.7 C) 98.3 F (36.8 C) 98.2 F (36.8 C)   TempSrc: Oral Oral Oral   SpO2: 100% 100% 99%  99%  Weight:   69 kg   Height:   6\' 2"  (1.88 m)     Intake/Output Summary (Last  24 hours) at 01/21/2021 1151 Last data filed at 01/21/2021 [id number] Gross per 24 hour  Intake 1080 ml  Output 400 ml  Net 680 ml    Filed Weights   01/13/21 1853 01/21/21 0546  Weight: 77.7 kg 69 kg    Examination:  General exam: Appears calm and comfortable Respiratory system: Clear to auscultation. Respiratory effort normal. Cardiovascular system: S1 & S2 Gastrointestinal system: Abdomen is nondistended, soft and nontender. No organomegaly or masses felt. Normal bowel sounds heard. Musculoskeletal: No edema. No calf tenderness Skin: No cyanosis. No rashes   Data Reviewed: I have personally reviewed following labs and imaging studies  CBC Lab Results  Component Value Date   WBC 11.6 (H) 01/18/2021   RBC 3.83 (L) 01/18/2021   HGB 11.7 (L) 01/18/2021   HCT 36.1 (L) 01/18/2021   MCV 94.3 01/18/2021   MCH 30.5 01/18/2021   PLT 268 01/18/2021   MCHC 32.4 01/18/2021   RDW 14.3 01/18/2021   LYMPHSABS 2.0 01/18/2021   MONOABS 1.0 01/18/2021   EOSABS 0.6 (H) 01/18/2021   BASOSABS 0.1 [id number]     Last metabolic panel Lab Results  Component Value Date   NA 135 01/18/2021   K 3.8 01/18/2021   CL 97 (L) 01/18/2021   CO2 30 01/18/2021   BUN 12 01/18/2021   CREATININE 1.19 01/21/2021   GLUCOSE 146 (H) 01/18/2021   GFRNONAA 60 (L) 01/21/2021   CALCIUM 8.8 (L) 01/18/2021   PHOS 3.7 03/29/2020   PROT 6.8 01/13/2021   ALBUMIN 3.4 (L) 01/13/2021   BILITOT 1.0 01/13/2021   ALKPHOS 115 01/13/2021   AST 48 (H) 01/13/2021   ALT 176 (H) 01/13/2021   ANIONGAP 8 01/18/2021    CBG (last 3)  No results for input(s): GLUCAP in the last 72 hours.   GFR: Estimated Creatinine Clearance: 44.3 mL/min (by C-G formula based on SCr of 1.19 mg/dL).   Recent Results (from the past 240 hour(s))  Resp Panel by RT-PCR (Flu A&B, Covid) Nasopharyngeal Swab     Status: None   Collection Time: 01/12/21  7:44 PM   Specimen: Nasopharyngeal Swab; Nasopharyngeal(NP) swabs in vial transport  medium  Result Value Ref Range Status   SARS Coronavirus 2 by RT PCR NEGATIVE NEGATIVE Final    Comment: (NOTE) SARS-CoV-2 target nucleic acids are NOT DETECTED.  The SARS-CoV-2 RNA is generally detectable in upper respiratory specimens during the acute phase of infection. The lowest concentration of SARS-CoV-2 viral copies this assay can detect is 138 copies/mL. A negative result does not preclude SARS-Cov-2 infection and should not be used as the sole basis for treatment or other patient management decisions. A negative result may occur with  improper specimen collection/handling, submission of specimen other than nasopharyngeal swab, presence of viral mutation(s) within the areas targeted by this assay, and inadequate number of viral copies(<138 copies/mL). A negative result must be combined with clinical observations, patient history, and epidemiological information. The expected result is Negative.  Fact Sheet for Patients:  EntrepreneurPulse.com.au  Fact Sheet for Healthcare Providers:  IncredibleEmployment.be  This test is no t yet approved or cleared by the [place] FDA and  has been authorized for detection and/or diagnosis of SARS-CoV-2 by FDA under an Emergency Use Authorization (EUA). This EUA will remain  in effect (meaning this test can be used) for the duration of the  COVID-19 declaration under Section 564(b)(1) of the Act, 21 U.S.C.section 360bbb-3(b)(1), unless the authorization is terminated  or revoked sooner.       Influenza A by PCR NEGATIVE NEGATIVE Final   Influenza B by PCR NEGATIVE NEGATIVE Final    Comment: (NOTE) The Xpert Xpress SARS-CoV-2/FLU/RSV plus assay is intended as an aid in the diagnosis of influenza from Nasopharyngeal swab specimens and should not be used as a sole basis for treatment. Nasal washings and aspirates are unacceptable for Xpert Xpress SARS-CoV-2/FLU/RSV testing.  Fact Sheet for  Patients: EntrepreneurPulse.com.au  Fact Sheet for Healthcare Providers: IncredibleEmployment.be  This test is not yet approved or cleared by the [place] FDA and has been authorized for detection and/or diagnosis of SARS-CoV-2 by FDA under an Emergency Use Authorization (EUA). This EUA will remain in effect (meaning this test can be used) for the duration of the COVID-19 declaration under Section 564(b)(1) of the Act, 21 U.S.C. section 360bbb-3(b)(1), unless the authorization is terminated or revoked.  Performed at [facility], [place] [address], [place], [place] [address]   Culture, blood (routine x 2)     Status: Abnormal   Collection Time: 01/12/21  8:00 PM   Specimen: BLOOD  Result Value Ref Range Status   Specimen Description   Final    BLOOD RIGHT ANTECUBITAL Performed at [place] [address], [place], [place] [address]    Special Requests   Final    BOTTLES DRAWN AEROBIC AND ANAEROBIC Blood Culture adequate volume Performed at [place] [address], [place],  [address]    Culture  Setup Time   Final    GRAM POSITIVE COCCI IN CLUSTERS IN BOTH AEROBIC AND ANAEROBIC BOTTLES CRITICAL RESULT CALLED TO, READ BACK BY AND VERIFIED WITH: E WILLIAMSON PHARMD 1702 01/13/21 A BROWNING    Culture (A)  Final    STAPHYLOCOCCUS HOMINIS THE SIGNIFICANCE OF ISOLATING THIS ORGANISM FROM A SINGLE SET OF BLOOD CULTURES WHEN MULTIPLE SETS ARE DRAWN IS UNCERTAIN. PLEASE NOTIFY THE MICROBIOLOGY DEPARTMENT WITHIN ONE WEEK IF SPECIATION AND SENSITIVITIES ARE REQUIRED. Performed at [facility], [place] [address], [place],  [address]    Report Status 01/15/2021 FINAL  Final  Blood Culture ID Panel (Reflexed)     Status: Abnormal   Collection Time: 01/12/21  8:00 PM  Result Value Ref Range Status   Enterococcus faecalis NOT DETECTED NOT DETECTED Final   Enterococcus  Faecium NOT DETECTED NOT DETECTED Final   Listeria monocytogenes NOT DETECTED NOT DETECTED Final   Staphylococcus species DETECTED (A) NOT DETECTED Final    Comment: CRITICAL RESULT CALLED TO, READ BACK BY AND VERIFIED WITH: Cristopher Estimable PHARMD 1702 01/13/21 A BROWNING    Staphylococcus aureus (BCID) NOT DETECTED NOT DETECTED Final   Staphylococcus epidermidis NOT DETECTED NOT DETECTED Final   Staphylococcus lugdunensis NOT DETECTED NOT DETECTED Final   Streptococcus species NOT DETECTED NOT DETECTED Final   Streptococcus agalactiae NOT DETECTED NOT DETECTED Final   Streptococcus pneumoniae NOT DETECTED NOT DETECTED Final   Streptococcus pyogenes NOT DETECTED NOT DETECTED Final   A.calcoaceticus-baumannii NOT DETECTED NOT DETECTED Final   Bacteroides fragilis NOT DETECTED NOT DETECTED Final   Enterobacterales NOT DETECTED NOT DETECTED Final   Enterobacter cloacae complex NOT DETECTED NOT DETECTED Final   Escherichia coli NOT DETECTED NOT DETECTED Final   Klebsiella aerogenes NOT DETECTED NOT DETECTED Final   Klebsiella oxytoca NOT DETECTED NOT DETECTED Final   Klebsiella pneumoniae NOT  DETECTED NOT DETECTED Final   Proteus species NOT DETECTED NOT DETECTED Final   Salmonella species NOT DETECTED NOT DETECTED Final   Serratia marcescens NOT DETECTED NOT DETECTED Final   Haemophilus influenzae NOT DETECTED NOT DETECTED Final   Neisseria meningitidis NOT DETECTED NOT DETECTED Final   Pseudomonas aeruginosa NOT DETECTED NOT DETECTED Final   Stenotrophomonas maltophilia NOT DETECTED NOT DETECTED Final   Candida albicans NOT DETECTED NOT DETECTED Final   Candida auris NOT DETECTED NOT DETECTED Final   Candida glabrata NOT DETECTED NOT DETECTED Final   Candida krusei NOT DETECTED NOT DETECTED Final   Candida parapsilosis NOT DETECTED NOT DETECTED Final   Candida tropicalis NOT DETECTED NOT DETECTED Final   Cryptococcus neoformans/gattii NOT DETECTED NOT DETECTED Final    Comment: Performed  at [facility], [place] [address] [place], [place] [address]  Culture, blood (routine x 2)     Status: None   Collection Time: 01/12/21  9:53 PM   Specimen: BLOOD  Result Value Ref Range Status   Specimen Description   Final    BLOOD BLOOD RIGHT FOREARM Performed at [place] [address], [place], [place] [address]    Special Requests   Final    BOTTLES DRAWN AEROBIC AND ANAEROBIC Blood Culture adequate volume Performed at [place] [address] [place], [place] [address]    Culture   Final    NO GROWTH 5 DAYS Performed at [facility], [place] [address], [place], [place] [address]    Report Status 01/18/2021 FINAL  Final  Culture, blood (routine x 2)     Status: None   Collection Time: 01/13/21  8:03 PM   Specimen: BLOOD  Result Value Ref Range Status   Specimen Description   Final    BLOOD BLOOD RIGHT HAND Performed at [place] [address], [place], [place] [address]    Special Requests   Final    BOTTLES DRAWN AEROBIC ONLY Blood Culture results may not be optimal due to an inadequate volume of blood received in culture bottles Performed at [place] [address], [place], [place] [address]    Culture   Final    NO GROWTH 5 DAYS Performed at [facility], [place] [address], [place], [place] [address]    Report Status 01/19/2021 FINAL  Final  Culture, blood (routine x 2)     Status: None   Collection Time: 01/13/21  8:03 PM   Specimen: BLOOD  Result Value Ref Range Status   Specimen Description   Final    BLOOD RIGHT ANTECUBITAL Performed at [place] [address] [place], [place] [address]    Special Requests   Final    BOTTLES DRAWN AEROBIC ONLY Blood Culture results may not be optimal due to an inadequate volume of blood received in culture bottles Performed at [place] [address], [place], [place]  [address]    Culture   Final    NO GROWTH 5 DAYS Performed at [facility], [place] [address] [place], [place] [address]    Report Status 01/19/2021 FINAL  Final  Culture, blood (routine x 2)     Status: None   Collection Time: 01/14/21  4:56 PM   Specimen: BLOOD LEFT HAND  Result Value Ref Range Status   Specimen Description   Final    BLOOD LEFT HAND Performed at [facility]  [place] [address], [place], [place] [address]    Special Requests   Final    BOTTLES DRAWN AEROBIC ONLY Blood Culture adequate volume Performed at [place] [address] [place], [place] [address]    Culture   Final    NO GROWTH 5 DAYS Performed at [facility], [place] [address] [place], [place] [address]    Report Status 01/20/2021 FINAL  Final  Culture, blood (routine x 2)     Status: None   Collection Time: 01/14/21  5:01 PM   Specimen: Left Antecubital; Blood  Result Value Ref Range Status   Specimen Description   Final    LEFT ANTECUBITAL Performed at [place] [address], [place], [place] [address]    Special Requests   Final    BOTTLES DRAWN AEROBIC ONLY Blood Culture adequate volume Performed at [place] [address] [place], [place] [address]    Culture   Final    NO GROWTH 5 DAYS Performed at [facility], [place] [address], [place], [place] [address]    Report Status 01/20/2021 FINAL  Final  MRSA Next Gen by PCR, Nasal     Status: None   Collection Time: 01/16/21 10:26 AM   Specimen: Nasal Mucosa; Nasal Swab  Result Value Ref Range Status   MRSA by PCR Next Gen NOT DETECTED NOT DETECTED Final    Comment: (NOTE) The GeneXpert MRSA Assay (FDA approved for NASAL specimens only), is one component of a comprehensive MRSA colonization surveillance program. It is not intended to diagnose MRSA infection nor to guide or monitor treatment for MRSA infections. Test performance is not FDA  approved in patients less than 51 years old. Performed at [facility], [place] [address] [place], [place] [address]          Radiology Studies: No results found.      Scheduled Meds:  albuterol  2.5 mg Nebulization BID   apixaban  5 mg Oral BID   diclofenac Sodium  2 g Topical QID   guaiFENesin  1,200 mg Oral BID   [START ON 01/22/2021] losartan  12.5 mg Oral Daily   mouth rinse  15 mL Mouth Rinse BID   melatonin  3 mg Oral QHS   mirtazapine  7.5 mg Oral QHS   multivitamin with minerals  1 tablet Oral Daily   senna-docusate  2 tablet Oral QHS   sertraline  50 mg Oral Daily   sodium chloride flush  3 mL Intravenous Q12H   Continuous Infusions:  sodium chloride 10 mL/hr at 01/17/21 1736     LOS: 8 days    Cordelia Poche, MD [facility] 01/21/2021, 11:51 AM  If 7PM-7AM, please contact night-coverage [email or web address]

## 2021-01-21 NOTE — Plan of Care (Signed)

## 2021-01-22 LAB — CREATININE, SERUM
Creatinine, Ser: 1.12 mg/dL (ref 0.61–1.24)
GFR, Estimated: >60 mL/min (ref >60)

## 2021-01-22 NOTE — Progress Notes (Signed)
PROGRESS NOTE    Robert Carpenter  [id number] DOB: 02-Oct-1933 DOA: 01/12/2021 PCP: Rosaria Ferries, MD   Brief Narrative: Robert Carpenter is a 88 y.o. male with a history of atrial fibrillation, hypertension, DVT, cardiac arrest, gastrostomy/tracheostomy now removed. Patient presented secondary to fever, cough and weakness and found to have evidence of sepsis secondary to pneumonia and associated hypoxia. Antibiotics given with improvement.   Assessment & Plan:   * Sepsis due to pneumonia (HCC)-resolved as of 01/18/2021, (present on admission) Patient with associated hypoxia. Patient managed initially on Ceftriaxone/Azithromycin and has completed course.  Acute respiratory failure with hypoxia (HCC) Secondary to pneumonia and possible pulmonary edema vs pulmonary infiltrates. Given IV lasix; unsure if contributing to improvement. -Wean to room air as able -Keep SpO2 >90%  AKI (acute kidney injury) (HCC)-resolved as of 01/18/2021 Creatinine of 1.38 on admission with baseline of about 1. Resolved with IV fluids.  Chronic systolic CHF (congestive heart failure) (HCC) Noted on Transthoracic Echocardiogram. EF of 40-45%. Patient is not on an ACEi/ARB or beta blocker at this time. -Continue Losartan  Pulmonary edema Noted on chest x-ray. Given IV lasix with no significant result. Opacity likely related to pneumonia.  Thyroid nodule Noted on thyroid ultrasound with recommendation for needle aspiration biopsy.  Chronic atrial fibrillation ([place])- (present on admission) Rate controlled. -Continue Eliquis  HTN (hypertension) Patient is on amlodipine as an outpatient which has been held this admission. Blood pressure uncontrolled. Blood pressure low with standing while on Losartan 25 mg daily. Patient symptomatic with PT on 1/21. -Losartan 12.5 mg daily  Positive blood culture-resolved as of 01/18/2021 Initial blood culture significant for Staphylococcus hominis. Patient  was managed empirically with Vancomycin IV which was discontinued since culture result was a likely contaminant. Repeat cultures with no growth.     DVT prophylaxis: Eliquis Code Status:   Code Status: Full Code Family Communication: None at bedside. Called son but no response; left voicemail Disposition Plan: Discharge back to SNF likely in 1 day   Consultants:  None  Procedures:  TRANSTHORACIC ECHOCARDIOGRAM  IMPRESSIONS     1. Left ventricular ejection fraction, by estimation, is 40 to 45%. Left  ventricular ejection fraction by 2D MOD biplane is 43.6 %. The left  ventricle has mildly decreased function. The left ventricle demonstrates  global hypokinesis. There is mild left  ventricular hypertrophy. Left ventricular diastolic parameters are  consistent with Grade I diastolic dysfunction (impaired relaxation).   2. Right ventricular systolic function is normal. The right ventricular  size is normal. There is mildly elevated pulmonary artery systolic  pressure. The estimated right ventricular systolic pressure is [id number] mmHg.   3. The mitral valve is grossly normal. Trivial mitral valve  regurgitation.   4. The aortic valve is tricuspid. Aortic valve regurgitation is not  visualized.   5. The inferior vena cava is normal in size with greater than 50%  respiratory variability, suggesting right atrial pressure of 3 mmHg.   6. Rhythm strip during this exam demostrated normal sinus rhythm and 1st  degree AVB.   Antimicrobials: Vancomycin Ceftriaxone Azithromycin    Subjective: No concerns this morning. No dyspnea.  Objective: Vitals:   01/22/21 0840 01/22/21 1037 01/22/21 1040 01/22/21 1342  BP:  102/71 121/89 (!) 124/58  Pulse:  63 66 (!) 58  Resp:    18  Temp:    98.4 F (36.9 C)  TempSrc:    Oral  SpO2: 99% 100% 95% 100%  Weight:  Height:        Intake/Output Summary (Last 24 hours) at 01/22/2021 1346 Last data filed at 01/22/2021 0900 Gross per 24 hour   Intake 480 ml  Output 950 ml  Net -470 ml    Filed Weights   01/13/21 1853 01/21/21 0546  Weight: 77.7 kg 69 kg    Examination:  General exam: Appears calm and comfortable Respiratory system: Rales. Respiratory effort normal. Cardiovascular system: S1 & S2 heard, irregular rhythm Gastrointestinal system: Abdomen is nondistended, soft and nontender. No organomegaly or masses felt. Normal bowel sounds heard. Central nervous system: Alert and oriented. No focal neurological deficits. Musculoskeletal: No edema. No calf tenderness Skin: No cyanosis. No rashes    Data Reviewed: I have personally reviewed following labs and imaging studies  CBC Lab Results  Component Value Date   WBC 11.6 (H) 01/18/2021   RBC 3.83 (L) 01/18/2021   HGB 11.7 (L) 01/18/2021   HCT 36.1 (L) 01/18/2021   MCV 94.3 01/18/2021   MCH 30.5 01/18/2021   PLT 268 01/18/2021   MCHC 32.4 01/18/2021   RDW 14.3 01/18/2021   LYMPHSABS 2.0 01/18/2021   MONOABS 1.0 01/18/2021   EOSABS 0.6 (H) 01/18/2021   BASOSABS 0.1 [id number]     Last metabolic panel Lab Results  Component Value Date   NA 135 01/18/2021   K 3.8 01/18/2021   CL 97 (L) 01/18/2021   CO2 30 01/18/2021   BUN 12 01/18/2021   CREATININE 1.12 01/22/2021   GLUCOSE 146 (H) 01/18/2021   GFRNONAA >60 01/22/2021   CALCIUM 8.8 (L) 01/18/2021   PHOS 3.7 03/29/2020   PROT 6.8 01/13/2021   ALBUMIN 3.4 (L) 01/13/2021   BILITOT 1.0 01/13/2021   ALKPHOS 115 01/13/2021   AST 48 (H) 01/13/2021   ALT 176 (H) 01/13/2021   ANIONGAP 8 01/18/2021    CBG (last 3)  No results for input(s): GLUCAP in the last 72 hours.   GFR: Estimated Creatinine Clearance: 47.1 mL/min (by C-G formula based on SCr of 1.12 mg/dL).   Recent Results (from the past 240 hour(s))  Resp Panel by RT-PCR (Flu A&B, Covid) Nasopharyngeal Swab     Status: None   Collection Time: 01/12/21  7:44 PM   Specimen: Nasopharyngeal Swab; Nasopharyngeal(NP) swabs in vial transport  medium  Result Value Ref Range Status   SARS Coronavirus 2 by RT PCR NEGATIVE NEGATIVE Final    Comment: (NOTE) SARS-CoV-2 target nucleic acids are NOT DETECTED.  The SARS-CoV-2 RNA is generally detectable in upper respiratory specimens during the acute phase of infection. The lowest concentration of SARS-CoV-2 viral copies this assay can detect is 138 copies/mL. A negative result does not preclude SARS-Cov-2 infection and should not be used as the sole basis for treatment or other patient management decisions. A negative result may occur with  improper specimen collection/handling, submission of specimen other than nasopharyngeal swab, presence of viral mutation(s) within the areas targeted by this assay, and inadequate number of viral copies(<138 copies/mL). A negative result must be combined with clinical observations, patient history, and epidemiological information. The expected result is Negative.  Fact Sheet for Patients:  EntrepreneurPulse.com.au  Fact Sheet for Healthcare Providers:  IncredibleEmployment.be  This test is no t yet approved or cleared by the [place] FDA and  has been authorized for detection and/or diagnosis of SARS-CoV-2 by FDA under an Emergency Use Authorization (EUA). This EUA will remain  in effect (meaning this test can be used) for the duration of the COVID-19  declaration under Section 564(b)(1) of the Act, 21 U.S.C.section 360bbb-3(b)(1), unless the authorization is terminated  or revoked sooner.       Influenza A by PCR NEGATIVE NEGATIVE Final   Influenza B by PCR NEGATIVE NEGATIVE Final    Comment: (NOTE) The Xpert Xpress SARS-CoV-2/FLU/RSV plus assay is intended as an aid in the diagnosis of influenza from Nasopharyngeal swab specimens and should not be used as a sole basis for treatment. Nasal washings and aspirates are unacceptable for Xpert Xpress SARS-CoV-2/FLU/RSV testing.  Fact Sheet for  Patients: EntrepreneurPulse.com.au  Fact Sheet for Healthcare Providers: IncredibleEmployment.be  This test is not yet approved or cleared by the [place] FDA and has been authorized for detection and/or diagnosis of SARS-CoV-2 by FDA under an Emergency Use Authorization (EUA). This EUA will remain in effect (meaning this test can be used) for the duration of the COVID-19 declaration under Section 564(b)(1) of the Act, 21 U.S.C. section 360bbb-3(b)(1), unless the authorization is terminated or revoked.  Performed at [facility], [place] [address] [place], [place] [address]   Culture, blood (routine x 2)     Status: Abnormal   Collection Time: 01/12/21  8:00 PM   Specimen: BLOOD  Result Value Ref Range Status   Specimen Description   Final    BLOOD RIGHT ANTECUBITAL Performed at [facility] [address], [place], [place] [address]    Special Requests   Final    BOTTLES DRAWN AEROBIC AND ANAEROBIC Blood Culture adequate volume Performed at [facility] [address] [place], [place] [address]    Culture  Setup Time   Final    GRAM POSITIVE COCCI IN CLUSTERS IN BOTH AEROBIC AND ANAEROBIC BOTTLES CRITICAL RESULT CALLED TO, READ BACK BY AND VERIFIED WITH: E WILLIAMSON PHARMD 1702 01/13/21 A BROWNING    Culture (A)  Final    STAPHYLOCOCCUS HOMINIS THE SIGNIFICANCE OF ISOLATING THIS ORGANISM FROM A SINGLE SET OF BLOOD CULTURES WHEN MULTIPLE SETS ARE DRAWN IS UNCERTAIN. PLEASE NOTIFY THE MICROBIOLOGY DEPARTMENT WITHIN ONE WEEK IF SPECIATION AND SENSITIVITIES ARE REQUIRED. Performed at [facility], [place] [address], [place],  [address]    Report Status 01/15/2021 FINAL  Final  Blood Culture ID Panel (Reflexed)     Status: Abnormal   Collection Time: 01/12/21  8:00 PM  Result Value Ref Range Status   Enterococcus faecalis NOT DETECTED NOT DETECTED Final   Enterococcus  Faecium NOT DETECTED NOT DETECTED Final   Listeria monocytogenes NOT DETECTED NOT DETECTED Final   Staphylococcus species DETECTED (A) NOT DETECTED Final    Comment: CRITICAL RESULT CALLED TO, READ BACK BY AND VERIFIED WITH: Cristopher Estimable PHARMD 1702 01/13/21 A BROWNING    Staphylococcus aureus (BCID) NOT DETECTED NOT DETECTED Final   Staphylococcus epidermidis NOT DETECTED NOT DETECTED Final   Staphylococcus lugdunensis NOT DETECTED NOT DETECTED Final   Streptococcus species NOT DETECTED NOT DETECTED Final   Streptococcus agalactiae NOT DETECTED NOT DETECTED Final   Streptococcus pneumoniae NOT DETECTED NOT DETECTED Final   Streptococcus pyogenes NOT DETECTED NOT DETECTED Final   A.calcoaceticus-baumannii NOT DETECTED NOT DETECTED Final   Bacteroides fragilis NOT DETECTED NOT DETECTED Final   Enterobacterales NOT DETECTED NOT DETECTED Final   Enterobacter cloacae complex NOT DETECTED NOT DETECTED Final   Escherichia coli NOT DETECTED NOT DETECTED Final   Klebsiella aerogenes NOT DETECTED NOT DETECTED Final   Klebsiella oxytoca NOT DETECTED NOT DETECTED Final   Klebsiella pneumoniae NOT DETECTED  NOT DETECTED Final   Proteus species NOT DETECTED NOT DETECTED Final   Salmonella species NOT DETECTED NOT DETECTED Final   Serratia marcescens NOT DETECTED NOT DETECTED Final   Haemophilus influenzae NOT DETECTED NOT DETECTED Final   Neisseria meningitidis NOT DETECTED NOT DETECTED Final   Pseudomonas aeruginosa NOT DETECTED NOT DETECTED Final   Stenotrophomonas maltophilia NOT DETECTED NOT DETECTED Final   Candida albicans NOT DETECTED NOT DETECTED Final   Candida auris NOT DETECTED NOT DETECTED Final   Candida glabrata NOT DETECTED NOT DETECTED Final   Candida krusei NOT DETECTED NOT DETECTED Final   Candida parapsilosis NOT DETECTED NOT DETECTED Final   Candida tropicalis NOT DETECTED NOT DETECTED Final   Cryptococcus neoformans/gattii NOT DETECTED NOT DETECTED Final    Comment: Performed  at [facility], [place] [address], [place], [place] [address]  Culture, blood (routine x 2)     Status: None   Collection Time: 01/12/21  9:53 PM   Specimen: BLOOD  Result Value Ref Range Status   Specimen Description   Final    BLOOD BLOOD RIGHT FOREARM Performed at [facility] [address] [place], [place] [address]    Special Requests   Final    BOTTLES DRAWN AEROBIC AND ANAEROBIC Blood Culture adequate volume Performed at [facility] [address], [place], [place] [address]    Culture   Final    NO GROWTH 5 DAYS Performed at [facility], [place] [address], [place], [place] [address]    Report Status 01/18/2021 FINAL  Final  Culture, blood (routine x 2)     Status: None   Collection Time: 01/13/21  8:03 PM   Specimen: BLOOD  Result Value Ref Range Status   Specimen Description   Final    BLOOD BLOOD RIGHT HAND Performed at [facility] [address], [place], [place] [address]    Special Requests   Final    BOTTLES DRAWN AEROBIC ONLY Blood Culture results may not be optimal due to an inadequate volume of blood received in culture bottles Performed at [facility] [address], [place], [place] [address]    Culture   Final    NO GROWTH 5 DAYS Performed at [facility], [place] [address] [place], [place] [address]    Report Status 01/19/2021 FINAL  Final  Culture, blood (routine x 2)     Status: None   Collection Time: 01/13/21  8:03 PM   Specimen: BLOOD  Result Value Ref Range Status   Specimen Description   Final    BLOOD RIGHT ANTECUBITAL Performed at [facility] [address] [place], [place] [address]    Special Requests   Final    BOTTLES DRAWN AEROBIC ONLY Blood Culture results may not be optimal due to an inadequate volume of blood received in culture bottles Performed at [place] [address] [place], [place]  [address]    Culture   Final    NO GROWTH 5 DAYS Performed at [facility], [place] [address], [place], [place] [address]    Report Status 01/19/2021 FINAL  Final  Culture, blood (routine x 2)     Status: None   Collection Time: 01/14/21  4:56 PM   Specimen: BLOOD LEFT HAND  Result Value Ref Range Status   Specimen Description   Final    BLOOD LEFT HAND Performed at [facility], [place] [address] [place], [place] [address]    Special Requests   Final    BOTTLES DRAWN AEROBIC ONLY Blood Culture adequate volume Performed at [facility] [address] [place], [place] [address]    Culture   Final    NO GROWTH 5 DAYS Performed at [facility], [place] [address], [place], [place] [address]    Report Status 01/20/2021 FINAL  Final  Culture, blood (routine x 2)     Status: None   Collection Time: 01/14/21  5:01 PM   Specimen: Left Antecubital; Blood  Result Value Ref Range Status   Specimen Description   Final    LEFT ANTECUBITAL Performed at [facility] [place] [address], [place], [place] [address]    Special Requests   Final    BOTTLES DRAWN AEROBIC ONLY Blood Culture adequate volume Performed at [facility] [address], [place], [place] [address]    Culture   Final    NO GROWTH 5 DAYS Performed at [facility], [place] [address] [place], [place] [address]    Report Status 01/20/2021 FINAL  Final  MRSA Next Gen by PCR, Nasal     Status: None   Collection Time: 01/16/21 10:26 AM   Specimen: Nasal Mucosa; Nasal Swab  Result Value Ref Range Status   MRSA by PCR Next Gen NOT DETECTED NOT DETECTED Final    Comment: (NOTE) The GeneXpert MRSA Assay (FDA approved for NASAL specimens only), is one component of a comprehensive MRSA colonization surveillance program. It is not intended to diagnose MRSA infection nor to guide or monitor treatment for MRSA infections. Test performance is not FDA  approved in patients less than 83 years old. Performed at [facility], [place] [address], [place],  [address]          Radiology Studies: No results found.      Scheduled Meds:  albuterol  2.5 mg Nebulization BID   apixaban  5 mg Oral BID   diclofenac Sodium  2 g Topical QID   guaiFENesin  1,200 mg Oral BID   losartan  12.5 mg Oral Daily   mouth rinse  15 mL Mouth Rinse BID   melatonin  3 mg Oral QHS   mirtazapine  7.5 mg Oral QHS   multivitamin with minerals  1 tablet Oral Daily   senna-docusate  2 tablet Oral QHS   sertraline  50 mg Oral Daily   sodium chloride flush  3 mL Intravenous Q12H   Continuous Infusions:  sodium chloride 10 mL/hr at 01/17/21 1736     LOS: 9 days    Cordelia Poche, MD [facility] 01/22/2021, 1:46 PM  If 7PM-7AM, please contact night-coverage [email or web address]

## 2021-01-23 LAB — RESP PANEL BY RT-PCR (FLU A&B, COVID) ARPGX2
Influenza A by PCR: NEGATIVE
Influenza B by PCR: NEGATIVE
SARS Coronavirus 2 by RT PCR: NEGATIVE

## 2021-01-23 MED ORDER — LOSARTAN POTASSIUM 25 MG PO TABS: 12.5000 mg | ORAL_TABLET | Freq: Every day | ORAL | Status: DC

## 2021-01-23 MED ORDER — GUAIFENESIN ER 600 MG PO TB12: 1200.0000 mg | ORAL_TABLET | Freq: Two times a day (BID) | ORAL | Status: AC

## 2021-01-23 NOTE — Discharge Instructions (Signed)
Robert Carpenter,  You were here with pneumonia. This was treated with antibiotics and has resolved. You still have some congestion in your lungs and I suggest continued mobilization, flutter valve use and Mucinex use. You will need a repeat chest x-ray in 2-3 weeks.

## 2021-01-23 NOTE — TOC Transition Note (Signed)
Transition of Care [facility]) - CM/SW Discharge Note   Patient Details  Name: Robert Carpenter MRN: [id number] Date of Birth: 02-28-1933  [facility] CM/SW Contact:  Golda Acre, RN Phone Number: 01/23/2021, 12:03 PM   Clinical Narrative:    Awaiting covid rapid test for transport [facility].  Olegario Messier aware of return.  Floor rn to call for transport when covid test is back if neg.     Barriers to Discharge: Continued Medical Work up   Patient Goals and CMS Choice Patient states their goals for this hospitalization and ongoing recovery are:: to go home CMS Medicare.gov Compare Post Acute Care list provided to:: Patient    Discharge Placement                       Discharge Plan and Services   Discharge Planning Services: CM Consult                                 Social Determinants of Health (SDOH) Interventions     Readmission Risk Interventions No flowsheet data found.

## 2021-01-23 NOTE — Discharge Summary (Signed)
Physician Discharge Summary  Robert Carpenter RUE:[id number] DOB: Jul 04, 1933 DOA: 01/12/2021  PCP: Shayne AlkenBrown, Stephanie Delores, MD  Admit date: 01/12/2021 Discharge date: 01/23/2021  Admitted From: SNF Disposition: SNF  Recommendations for Outpatient Follow-up:  Follow up with PCP in 1 week Repeat chest x-ray in 2-3 weeks Needle aspiration biopsy of thyroid recommended for evaluation of incidental thyroid nodule Wean oxygen down to room air Please follow up on the following pending results: None  Home Health: SNF Equipment/Devices: Oxygen, 2 L/min  Discharge Condition: Stable CODE STATUS: Full code Diet recommendation: Carb modified   Brief/Interim Summary:  Admission HPI written by Synetta FailAlexander B Melvin, MD   HPI: Robert PingSherman D Aracena is a 88 y.o. male with medical history significant of atrial fibrillation, hypertension, history of DVT, history of cardiac arrest, history of gastrostomy and tracheostomy who presents from facility with concern for 1 day of fever, cough and increasing weakness.  As above patient presents from his facility where he lives with fever cough and weakness for 1 day.  EMS was called and noted to be saturating 85% on room air improved to 96% on nasal cannula.  There has reportedly been a respiratory illness going around at the facility possibly flu.  Patient reports he has had some ongoing medical issues ever since he was admitted with COVID and had a cardiac arrest last year.  Patient denies fever, chills, chest pain, abdominal pain, constipation, diarrhea, nausea, vomiting.   Hospital course:  * Sepsis due to pneumonia (HCC)-resolved as of 01/18/2021, (present on admission) Patient with associated hypoxia. Patient managed initially on Ceftriaxone/Azithromycin and has completed course.  Acute respiratory failure with hypoxia (HCC) Secondary to pneumonia and possible pulmonary edema vs pulmonary infiltrates. Given IV lasix without much improvement. Wean to  room air as able. Discharged on 2 L/min of oxygen.  AKI (acute kidney injury) (HCC)-resolved as of 01/18/2021 Creatinine of 1.38 on admission with baseline of about 1. Resolved with IV fluids.  Chronic systolic CHF (congestive heart failure) (HCC) Noted on Transthoracic Echocardiogram. EF of 40-45%. Patient was not on an ACEi/ARB or beta blocker as an outpatient. Started on Losartan 25 mg daily and decreased to Losartan 12.5 mg daily. -Continue Losartan  Pulmonary edema Noted on chest x-ray. Given IV lasix with no significant result. Opacity likely related to pneumonia.  Thyroid nodule Noted on thyroid ultrasound with recommendation for needle aspiration biopsy.  Chronic atrial fibrillation (HCC)- (present on admission) Rate controlled. Continue Eliquis  HTN (hypertension) Patient is on amlodipine as an outpatient which has been held this admission. Blood pressure uncontrolled. Blood pressure low with standing while on Losartan 25 mg daily. Patient with symptomatic hypotension with physical therapy on 1/21.Decreased to Losartan 12.5 mg daily with improvement of symptoms.  Positive blood culture-resolved as of 01/18/2021 Initial blood culture significant for Staphylococcus hominis. Patient was managed empirically with Vancomycin IV which was discontinued since culture result was a likely contaminant. Repeat cultures with no growth.     Discharge Instructions   Allergies as of 01/23/2021   No Known Allergies      Medication List     STOP taking these medications    amLODipine 10 MG tablet Commonly known as: NORVASC   guaiFENesin 100 MG/5ML liquid Commonly known as: ROBITUSSIN Replaced by: guaiFENesin 600 MG 12 hr tablet   insulin lispro 100 UNIT/ML injection Commonly known as: HUMALOG   oseltamivir 75 MG capsule Commonly known as: TAMIFLU       TAKE these medications  acetaminophen 325 MG tablet Commonly known as: TYLENOL Take 650 mg by mouth every 6 (six)  hours as needed for headache, fever or mild pain.   amantadine 100 MG capsule Commonly known as: SYMMETREL Take 100 mg by mouth daily.   apixaban 5 MG Tabs tablet Commonly known as: ELIQUIS Take 5 mg by mouth 2 (two) times daily.   diclofenac Sodium 1 % Gel Commonly known as: VOLTAREN Apply 2 g topically See admin instructions. Apply 2 grams to the left hip every 6 hours as needed for pain   famotidine 20 MG tablet Commonly known as: PEPCID Take 20 mg by mouth 2 (two) times daily.   Fish Oil 1000 MG Caps Take 1,000 mg by mouth daily.   guaiFENesin 600 MG 12 hr tablet Commonly known as: MUCINEX Take 2 tablets (1,200 mg total) by mouth 2 (two) times daily for 10 days. Replaces: guaiFENesin 100 MG/5ML liquid   losartan 25 MG tablet Commonly known as: COZAAR Take 0.5 tablets (12.5 mg total) by mouth daily.   melatonin 3 MG Tabs tablet Take 3 mg by mouth at bedtime.   mirtazapine 7.5 MG tablet Commonly known as: REMERON Take 7.5 mg by mouth at bedtime.   multivitamin with minerals Tabs tablet Take 1 tablet by mouth daily.   sennosides-docusate sodium 8.6-50 MG tablet Commonly known as: SENOKOT-S Take 2 tablets by mouth at bedtime.   sertraline 50 MG tablet Commonly known as: ZOLOFT Take 50 mg by mouth daily.        Follow-up Information     Shayne AlkenBrown, Stephanie Delores, MD. Schedule an appointment as soon as possible for a visit in 1 week(s).   Why: For hospital follow-up Contact information: [address] [place] [address] [phone]                No Known Allergies  Consultations: None   Procedures/Studies: DG CHEST PORT 1 VIEW  Result Date: 01/19/2021 CLINICAL DATA:  Hypoxia EXAM: PORTABLE CHEST 1 VIEW COMPARISON:  Chest radiograph 01/14/2021 FINDINGS: The heart is enlarged, unchanged. The upper mediastinal contours are prominent, unchanged. Rightward tracheal deviation is unchanged. Lung volumes are low. Patchy opacities throughout the  right lung are stable to slightly worsened in the interim. Linear opacities in the left base are unchanged. The left upper lobe remains well-aerated. There is no significant pleural effusion. There is no pneumothorax. The bones are stable. Enteric contrast is seen in the left upper quadrant. IMPRESSION: 1. Patchy opacities throughout the right lung are overall stable to slightly worsened in the interim. Linear opacity in the left base are also unchanged and favored to reflect atelectasis. 2. Unchanged rightward tracheal deviation. Electronically Signed   By: Lesia HausenPeter  Noone M.D.   On: 01/19/2021 08:05   DG CHEST PORT 1 VIEW  Result Date: 01/14/2021 CLINICAL DATA:  Cough and shortness of breath. EXAM: PORTABLE CHEST 1 VIEW COMPARISON:  January 12, 2021 FINDINGS: Stable deviation of trachea to the right. Persistent fullness of the mediastinum. Stable streaky airspace opacities in the left lung base. Persistent patchy and reticular airspace opacities throughout the right lung. Osseous structures are without acute abnormality. Soft tissues are grossly normal. IMPRESSION: 1. Stable streaky airspace opacities in the left lung base. 2. Persistent patchy and reticular airspace opacities throughout the right lung. 3. Stable deviation of the trachea to the right. Electronically Signed   By: Ted Mcalpineobrinka  Dimitrova M.D.   On: 01/14/2021 17:12   DG Chest Port 1 View  Result Date: 01/12/2021  CLINICAL DATA:  Shortness of breath EXAM: PORTABLE CHEST 1 VIEW COMPARISON:  06/10/2020, 03/10/2020, 02/19/2020, 12/22/2019, CT lung bases 01/27/2020 FINDINGS: Bandlike opacities at the left base similar compared to previous exams and could represent scarring. Underlying reticular interstitial opacity suspect for a component of underlying chronic lung disease. More acute appearing superimposed ground-glass opacities in the right thorax. Cardiomediastinal silhouette is enlarged. Tracheal deviation to the right. No pneumothorax. IMPRESSION:  1. Suspect that there is a component of underlying chronic lung disease and scarring. Probable acute superimposed ground-glass opacity in the right thorax which may be due to acute pneumonia. 2. Cardiomegaly 3. Tracheal deviation to the right suspicious for paratracheal mass or enlarged thyroid (CT neck images from 2022 not available for direct comparison at the time of dictation ) Electronically Signed   By: Jasmine Pang M.D.   On: 01/12/2021 19:02   DG Swallowing Func-Speech Pathology  Result Date: 01/15/2021 Table formatting from the original result was not included. Objective Swallowing Evaluation: Type of Study: MBS-Modified Barium Swallow Study  Patient Details Name: JASAN DOUGHTIE MRN: [id number] Date of Birth: December 18, 1933 Today's Date: 01/15/2021 Time: SLP Start Time (ACUTE ONLY): 1240 -SLP Stop Time (ACUTE ONLY): 1255 SLP Time Calculation (min) (ACUTE ONLY): 15 min Past Medical History: Past Medical History: Diagnosis Date  Acute on chronic respiratory failure with hypoxia (HCC)   Cardiac arrest (HCC)   Cardiac arrest (HCC)   Chronic atrial fibrillation (HCC)   COVID-19 virus infection   Hypertension  Past Surgical History: Past Surgical History: Procedure Laterality Date  HIP SURGERY    IR REPLC GASTRO/COLONIC TUBE PERCUT W/FLUORO  03/16/2020  IR REPLC GASTRO/COLONIC TUBE PERCUT W/FLUORO  04/07/2020 HPI: Patient is an 88 y.o. male with PMH: atrial fibrillation, hypertension, history of DVT, history of cardiac arrest, history of gastrostomy and tracheostomy who presents from facility with concern for 1 day of fever, cough and increasing weakness. He had a Modified Barium Swallow study while a patient in [facility] on 03/29/20 and although full report is not available to read, MBS images/films were and showed delay in initiation of swallow, some pharyngeal retention of barium contrast and penetration of thin liquid barium contrast but no aspiration. CXR completed during current admission  showed Stable streaky airspace opacities in the left lung base, persistent patchy and reticular airspace opacities throughout the right lung.  Subjective: pleasant, cooperative, no complaints  Recommendations for follow up therapy are one component of a multi-disciplinary discharge planning process, led by the attending physician.  Recommendations may be updated based on patient status, additional functional criteria and insurance authorization. Assessment / Plan / Recommendation Clinical Impressions 01/15/2021 Clinical Impression Patient presents with a mild oropharyngeal dysphagia as per this MBS. Of note, patient exhibiting improved swallow function as per SLP's comparison to radiology images/films from current study to the one completed during previous hospitalization in March of 2022. During current MBS, patient exhibited mild anterior to posterior transit delays with all consistencies during oral phase of swallow. During pharyngeal phase of swallow, he exhibited swallow initiation delay at level of vallecular sinus with regular and puree solids and at level of pyriform sinus with thin liquids. He exhibited flash penetration (very shallow penetration) (PAS 2) with both cup and straw sips of thin liquids. No aspiration observed. He exhibited trace vallecular and pyriform sinus residuals with thin liquids that did clear with subsequent swallows. Patient appears safe with current PO diet (regular solids, thin liquids) and SLP is not recommending further ST f/u at  this time. SLP Visit Diagnosis Dysphagia, unspecified (R13.10) Attention and concentration deficit following -- Frontal lobe and executive function deficit following -- Impact on safety and function Mild aspiration risk   Treatment Recommendations 01/15/2021 Treatment Recommendations Other (Comment)   Prognosis 01/15/2021 Prognosis for Safe Diet Advancement Good Barriers to Reach Goals -- Barriers/Prognosis Comment -- Diet Recommendations 01/15/2021 SLP Diet  Recommendations Regular solids;Thin liquid Liquid Administration via Cup;Straw Medication Administration Whole meds with liquid Compensations Slow rate;Small sips/bites Postural Changes Seated upright at 90 degrees   Other Recommendations 01/15/2021 Recommended Consults -- Oral Care Recommendations Oral care BID Other Recommendations -- Follow Up Recommendations No SLP follow up Assistance recommended at discharge None Functional Status Assessment Patient has had a recent decline in their functional status and demonstrates the ability to make significant improvements in function in a reasonable and predictable amount of time. Frequency and Duration  01/15/2021 Speech Therapy Frequency (ACUTE ONLY) min 1 x/week Treatment Duration 1 week   Oral Phase 01/15/2021 Oral Phase Impaired Oral - Pudding Teaspoon -- Oral - Pudding Cup -- Oral - Honey Teaspoon -- Oral - Honey Cup -- Oral - Nectar Teaspoon -- Oral - Nectar Cup -- Oral - Nectar Straw -- Oral - Thin Teaspoon -- Oral - Thin Cup Reduced posterior propulsion Oral - Thin Straw Piecemeal swallowing;Reduced posterior propulsion Oral - Puree Reduced posterior propulsion Oral - Mech Soft -- Oral - Regular Impaired mastication;Reduced posterior propulsion Oral - Multi-Consistency -- Oral - Pill WFL Oral Phase - Comment --  Pharyngeal Phase 01/15/2021 Pharyngeal Phase Impaired Pharyngeal- Pudding Teaspoon -- Pharyngeal -- Pharyngeal- Pudding Cup -- Pharyngeal -- Pharyngeal- Honey Teaspoon -- Pharyngeal -- Pharyngeal- Honey Cup -- Pharyngeal -- Pharyngeal- Nectar Teaspoon -- Pharyngeal -- Pharyngeal- Nectar Cup -- Pharyngeal -- Pharyngeal- Nectar Straw -- Pharyngeal -- Pharyngeal- Thin Teaspoon -- Pharyngeal -- Pharyngeal- Thin Cup Delayed swallow initiation-pyriform sinuses;Pharyngeal residue - valleculae;Pharyngeal residue - pyriform;Penetration/Aspiration during swallow Pharyngeal Material enters airway, remains ABOVE vocal cords then ejected out Pharyngeal- Thin Straw  Delayed swallow initiation-pyriform sinuses;Pharyngeal residue - valleculae;Pharyngeal residue - pyriform;Penetration/Aspiration during swallow Pharyngeal Material enters airway, remains ABOVE vocal cords then ejected out Pharyngeal- Puree Delayed swallow initiation-vallecula Pharyngeal -- Pharyngeal- Mechanical Soft -- Pharyngeal -- Pharyngeal- Regular Delayed swallow initiation-vallecula Pharyngeal -- Pharyngeal- Multi-consistency -- Pharyngeal -- Pharyngeal- Pill WFL Pharyngeal -- Pharyngeal Comment --  Cervical Esophageal Phase  01/15/2021 Cervical Esophageal Phase WFL Pudding Teaspoon -- Pudding Cup -- Honey Teaspoon -- Honey Cup -- Nectar Teaspoon -- Nectar Cup -- Nectar Straw -- Thin Teaspoon -- Thin Cup -- Thin Straw -- Puree -- Mechanical Soft -- Regular -- Multi-consistency -- Pill -- Cervical Esophageal Comment -- Angela Nevin, MA, CCC-SLP Speech Therapy                     ECHOCARDIOGRAM COMPLETE  Result Date: 01/16/2021    ECHOCARDIOGRAM REPORT   Patient Name:   CASHAWN YANKO Date of Exam: 01/16/2021 Medical Rec #:  [id number]        Height:       74.0 in Accession #:    [id number]       Weight:       171.4 lb Date of Birth:  02-Nov-1933        BSA:          2.036 m Patient Age:    87 years         BP:           124/78 mmHg  Patient Gender: M                HR:           110 bpm. Exam Location:  Inpatient Procedure: 2D Echo, Color Doppler and Cardiac Doppler Indications:    CHF  History:        Patient has no prior history of Echocardiogram examinations.                 Arrythmias:Atrial Fibrillation; Risk Factors:Hypertension.  Sonographer:    Cleatis Polka Referring Phys: [phone] AVA SWAYZE IMPRESSIONS  1. Left ventricular ejection fraction, by estimation, is 40 to 45%. Left ventricular ejection fraction by 2D MOD biplane is 43.6 %. The left ventricle has mildly decreased function. The left ventricle demonstrates global hypokinesis. There is mild left ventricular hypertrophy. Left ventricular  diastolic parameters are consistent with Grade I diastolic dysfunction (impaired relaxation).  2. Right ventricular systolic function is normal. The right ventricular size is normal. There is mildly elevated pulmonary artery systolic pressure. The estimated right ventricular systolic pressure is 36.4 mmHg.  3. The mitral valve is grossly normal. Trivial mitral valve regurgitation.  4. The aortic valve is tricuspid. Aortic valve regurgitation is not visualized.  5. The inferior vena cava is normal in size with greater than 50% respiratory variability, suggesting right atrial pressure of 3 mmHg.  6. Rhythm strip during this exam demostrated normal sinus rhythm and 1st degree AVB. Comparison(s): No prior Echocardiogram. FINDINGS  Left Ventricle: Left ventricular ejection fraction, by estimation, is 40 to 45%. Left ventricular ejection fraction by 2D MOD biplane is 43.6 %. The left ventricle has mildly decreased function. The left ventricle demonstrates global hypokinesis. The left ventricular internal cavity size was normal in size. There is mild left ventricular hypertrophy. Abnormal (paradoxical) septal motion, consistent with left bundle branch block. Left ventricular diastolic parameters are consistent with Grade I diastolic dysfunction (impaired relaxation). Indeterminate filling pressures. Right Ventricle: The right ventricular size is normal. No increase in right ventricular wall thickness. Right ventricular systolic function is normal. There is mildly elevated pulmonary artery systolic pressure. The tricuspid regurgitant velocity is 2.89  m/s, and with an assumed right atrial pressure of 3 mmHg, the estimated right ventricular systolic pressure is 36.4 mmHg. Left Atrium: Left atrial size was normal in size. Right Atrium: Right atrial size was normal in size. Pericardium: There is no evidence of pericardial effusion. Mitral Valve: The mitral valve is grossly normal. Trivial mitral valve regurgitation. Tricuspid  Valve: The tricuspid valve is grossly normal. Tricuspid valve regurgitation is trivial. Aortic Valve: The aortic valve is tricuspid. Aortic valve regurgitation is not visualized. Aortic valve peak gradient measures 5.0 mmHg. Pulmonic Valve: The pulmonic valve was normal in structure. Pulmonic valve regurgitation is not visualized. Aorta: The aortic root and ascending aorta are structurally normal, with no evidence of dilitation. Venous: The inferior vena cava is normal in size with greater than 50% respiratory variability, suggesting right atrial pressure of 3 mmHg. IAS/Shunts: No atrial level shunt detected by color flow Doppler. EKG: Rhythm strip during this exam demostrated normal sinus rhythm and 1st degree AVB.  LEFT VENTRICLE PLAX 2D                        Biplane EF (MOD) LVIDd:         3.70 cm         LV Biplane EF:   Left LVIDs:  3.00 cm                          ventricular LV PW:         1.10 cm                          ejection LV IVS:        1.10 cm                          fraction by LVOT diam:     2.00 cm                          2D MOD LV SV:         32                               biplane is LV SV Index:   16                               43.6 %. LVOT Area:     3.14 cm                                Diastology                                LV e' medial:    5.33 cm/s LV Volumes (MOD)               LV E/e' medial:  10.0 LV vol d, MOD    128.0 ml      LV e' lateral:   4.46 cm/s A2C:                           LV E/e' lateral: 11.9 LV vol d, MOD    140.0 ml A4C: LV vol s, MOD    69.9 ml A2C: LV vol s, MOD    81.4 ml A4C: LV SV MOD A2C:   58.1 ml LV SV MOD A4C:   140.0 ml LV SV MOD BP:    59.1 ml RIGHT VENTRICLE             IVC RV Basal diam:  3.20 cm     IVC diam: 2.30 cm RV Mid diam:    2.80 cm RV S prime:     12.60 cm/s TAPSE (M-mode): 1.6 cm LEFT ATRIUM           Index        RIGHT ATRIUM          Index LA diam:      3.10 cm 1.52 cm/m   RA Area:     9.52 cm LA Vol (A2C): 19.0 ml 9.33 ml/m    RA Volume:   16.30 ml 8.01 ml/m LA Vol (A4C): 57.4 ml 28.20 ml/m  AORTIC VALVE AV Area (Vmax): 2.38 cm AV Vmax:        112.00 cm/s AV Peak Grad:   5.0 mmHg LVOT Vmax:      84.90 cm/s LVOT Vmean:     54.600 cm/s LVOT  VTI:       0.103 m  AORTA Ao Root diam: 3.40 cm Ao Asc diam:  3.50 cm MITRAL VALVE               TRICUSPID VALVE MV Area (PHT): 3.61 cm    TR Peak grad:   33.4 mmHg MV Decel Time: 210 msec    TR Vmax:        289.00 cm/s MV E velocity: 53.10 cm/s MV A velocity: 54.80 cm/s  SHUNTS MV E/A ratio:  0.97        Systemic VTI:  0.10 m                            Systemic Diam: 2.00 cm Zoila Shutter MD Electronically signed by Zoila Shutter MD Signature Date/Time: 01/16/2021/2:23:45 PM    Final    US THYROID  Result Date: 01/13/2021 CLINICAL DATA:  Tracheal deviation EXAM: THYROID ULTRASOUND TECHNIQUE: Ultrasound examination of the thyroid gland and adjacent soft tissues was performed. COMPARISON:  None. FINDINGS: Parenchymal Echotexture: Mild is Isthmus: 0.2 cm Right lobe: 4.0 x 8 x 1 cm Left lobe: 6.2 x 3.4 x 5.0 cm _________________________________________________________ Estimated total number of nodules >/= 1 cm: 1 Number of spongiform nodules >/=  2 cm not described below (TR1): 0 Number of mixed cystic and solid nodules >/= 1.5 cm not described below (TR2): 0 _________________________________________________________ Nodule # 1: Location: Left; mid Maximum size: 5.2 cm; Other 2 dimensions: 3.2 x 4.1 cm Composition: solid/almost completely solid (2) Echogenicity: isoechoic (1) Shape: not taller-than-wide (0) Margins: smooth (0) Echogenic foci: none (0) ACR TI-RADS total points: 3. ACR TI-RADS risk category: TR3 (3 points). ACR TI-RADS recommendations: **Given size (>/= 2.5 cm) and appearance, fine needle aspiration of this mildly suspicious nodule should be considered based on TI-RADS criteria. _________________________________________________________ IMPRESSION: Nodule 1 (TI-RADS 3), measuring 5.2 x  3.2 x 4.1 cm, located in the mid left thyroid lobe, meets criteria for FNA. The above is in keeping with the ACR TI-RADS recommendations - J Am Coll Radiol 2017;14:587-595. Electronically Signed   By: Acquanetta Belling M.D.   On: 01/13/2021 06:09      Subjective: No concerns this morning. No chest pain or dyspnea  Discharge Exam: Vitals:   01/23/21 0632 01/23/21 0751  BP: 117/66   Pulse: (!) 52   Resp: 16   Temp: (!) 97.5 F (36.4 C)   SpO2: 100% 99%   Vitals:   01/22/21 1938 01/22/21 2111 01/23/21 0632 01/23/21 0751  BP:  (!) 108/59 117/66   Pulse:  (!) 55 (!) 52   Resp:  18 16   Temp:  98.5 F (36.9 C) (!) 97.5 F (36.4 C)   TempSrc:  Oral Oral   SpO2: 97% 100% 100% 99%  Weight:      Height:        General: Pt is alert, awake, not in acute distress Cardiovascular: Normal rate, irregular rhythm, S1/S2 +, no rubs, no gallops Respiratory: Some rhonchi R>L Abdominal: Soft, NT, ND, bowel sounds + Extremities: no edema, no cyanosis    The results of significant diagnostics from this hospitalization (including imaging, microbiology, ancillary and laboratory) are listed below for reference.     Microbiology: Recent Results (from the past 240 hour(s))  Culture, blood (routine x 2)     Status: None   Collection Time: 01/13/21  8:03 PM   Specimen: BLOOD  Result Value Ref Range Status  Specimen Description   Final    BLOOD BLOOD RIGHT HAND Performed at [facility], [address] [place], [place] [address]    Special Requests   Final    BOTTLES DRAWN AEROBIC ONLY Blood Culture results may not be optimal due to an inadequate volume of blood received in culture bottles Performed at [facility], [address] [place], [place] [address]    Culture   Final    NO GROWTH 5 DAYS Performed at [facility], [address] [place], [place] [address]    Report Status 01/19/2021 FINAL  Final  Culture, blood (routine x 2)     Status:  None   Collection Time: 01/13/21  8:03 PM   Specimen: BLOOD  Result Value Ref Range Status   Specimen Description   Final    BLOOD RIGHT ANTECUBITAL Performed at [facility], [address], [place], [place] [address]    Special Requests   Final    BOTTLES DRAWN AEROBIC ONLY Blood Culture results may not be optimal due to an inadequate volume of blood received in culture bottles Performed at [facility], [address] [place], [place] [address]    Culture   Final    NO GROWTH 5 DAYS Performed at [facility], [address] [place], [place] [address]    Report Status 01/19/2021 FINAL  Final  Culture, blood (routine x 2)     Status: None   Collection Time: 01/14/21  4:56 PM   Specimen: BLOOD LEFT HAND  Result Value Ref Range Status   Specimen Description   Final    BLOOD LEFT HAND Performed at [facility], [address], [place], [place] [address]    Special Requests   Final    BOTTLES DRAWN AEROBIC ONLY Blood Culture adequate volume Performed at [facility], [address], [place], [place] [address]    Culture   Final    NO GROWTH 5 DAYS Performed at [facility], [address] [place], [place] [address]    Report Status 01/20/2021 FINAL  Final  Culture, blood (routine x 2)     Status: None   Collection Time: 01/14/21  5:01 PM   Specimen: Left Antecubital; Blood  Result Value Ref Range Status   Specimen Description   Final    LEFT ANTECUBITAL Performed at [facility], [address] [place], [place] [address]    Special Requests   Final    BOTTLES DRAWN AEROBIC ONLY Blood Culture adequate volume Performed at [facility], [address], [place], [place] [address]    Culture   Final    NO GROWTH 5 DAYS Performed at [facility], [address], [place], [place] [address]    Report Status 01/20/2021 FINAL  Final  MRSA Next  Gen by PCR, Nasal     Status: None   Collection Time: 01/16/21 10:26 AM   Specimen: Nasal Mucosa; Nasal Swab  Result Value Ref Range Status   MRSA by PCR Next Gen NOT DETECTED NOT DETECTED Final    Comment: (NOTE) The GeneXpert MRSA Assay (FDA approved for NASAL specimens only), is one component of a comprehensive MRSA colonization surveillance program. It is not intended to diagnose MRSA infection nor to guide or monitor treatment for MRSA infections. Test performance is not FDA approved in patients less than 70 years old. Performed at  [facility], [address], [place], [place] [address]      Labs: BNP (last 3 results) No results for input(s): BNP in the last 8760 hours. Basic Metabolic Panel: Recent Labs  Lab 01/18/21 0331 01/19/21 0315 01/20/21 0324 01/21/21 0345 01/22/21 0346  NA 135  --   --   --   --   K 3.8  --   --   --   --   CL 97*  --   --   --   --   CO2 30  --   --   --   --   GLUCOSE 146*  --   --   --   --   BUN 12  --   --   --   --   CREATININE 0.96 0.93 1.07 1.19 1.12  CALCIUM 8.8*  --   --   --   --    Liver Function Tests: No results for input(s): AST, ALT, ALKPHOS, BILITOT, PROT, ALBUMIN in the last 168 hours. No results for input(s): LIPASE, AMYLASE in the last 168 hours. No results for input(s): AMMONIA in the last 168 hours. CBC: Recent Labs  Lab 01/17/21 0332 01/18/21 0331  WBC 10.9* 11.6*  NEUTROABS 7.2 7.9*  HGB 11.6* 11.7*  HCT 35.9* 36.1*  MCV 94.7 94.3  PLT 256 268   Cardiac Enzymes: No results for input(s): CKTOTAL, CKMB, CKMBINDEX, TROPONINI in the last 168 hours. BNP: Invalid input(s): POCBNP CBG: No results for input(s): GLUCAP in the last 168 hours. D-Dimer No results for input(s): DDIMER in the last 72 hours. Hgb A1c No results for input(s): HGBA1C in the last 72 hours. Lipid Profile No results for input(s): CHOL, HDL, LDLCALC, TRIG, CHOLHDL, LDLDIRECT in the last 72 hours. Thyroid function  studies No results for input(s): TSH, T4TOTAL, T3FREE, THYROIDAB in the last 72 hours.  Invalid input(s): FREET3 Anemia work up No results for input(s): VITAMINB12, FOLATE, FERRITIN, TIBC, IRON, RETICCTPCT in the last 72 hours. Urinalysis    Component Value Date/Time   COLORURINE YELLOW 06/10/2020 2208   APPEARANCEUR HAZY (A) 06/10/2020 2208   LABSPEC 1.017 06/10/2020 2208   PHURINE 5.0 06/10/2020 2208   GLUCOSEU NEGATIVE 06/10/2020 2208   HGBUR NEGATIVE 06/10/2020 2208   BILIRUBINUR NEGATIVE 06/10/2020 2208   KETONESUR 20 (A) 06/10/2020 2208   PROTEINUR 30 (A) 06/10/2020 2208   NITRITE NEGATIVE 06/10/2020 2208   LEUKOCYTESUR TRACE (A) 06/10/2020 2208   Sepsis Labs Invalid input(s): PROCALCITONIN,  WBC,  LACTICIDVEN Microbiology Recent Results (from the past 240 hour(s))  Culture, blood (routine x 2)     Status: None   Collection Time: 01/13/21  8:03 PM   Specimen: BLOOD  Result Value Ref Range Status   Specimen Description   Final    BLOOD BLOOD RIGHT HAND Performed at [facility], [address] [place], [place] [address]    Special Requests   Final    BOTTLES DRAWN AEROBIC ONLY Blood Culture results may not be optimal due to an inadequate volume of blood received in culture bottles Performed at [facility], [address] [place], [place] [address]    Culture   Final    NO GROWTH 5 DAYS Performed at [facility], [address] [place], [place] [address]    Report Status 01/19/2021 FINAL  Final  Culture, blood (routine x 2)     Status: None   Collection Time: 01/13/21  8:03 PM  Specimen: BLOOD  Result Value Ref Range Status   Specimen Description   Final    BLOOD RIGHT ANTECUBITAL Performed at [facility], [address], [place], [place] [address]    Special Requests   Final    BOTTLES DRAWN AEROBIC ONLY Blood Culture results may not be optimal due to an inadequate volume of blood received in  culture bottles Performed at [facility], [address] [place], [place] [address]    Culture   Final    NO GROWTH 5 DAYS Performed at [facility], [address], [place], [place] [address]    Report Status 01/19/2021 FINAL  Final  Culture, blood (routine x 2)     Status: None   Collection Time: 01/14/21  4:56 PM   Specimen: BLOOD LEFT HAND  Result Value Ref Range Status   Specimen Description   Final    BLOOD LEFT HAND Performed at [facility], [address], [place], [place] [address]    Special Requests   Final    BOTTLES DRAWN AEROBIC ONLY Blood Culture adequate volume Performed at [facility], [address] [place], [place] [address]    Culture   Final    NO GROWTH 5 DAYS Performed at [facility], [address] [place], [place] [address]    Report Status 01/20/2021 FINAL  Final  Culture, blood (routine x 2)     Status: None   Collection Time: 01/14/21  5:01 PM   Specimen: Left Antecubital; Blood  Result Value Ref Range Status   Specimen Description   Final    LEFT ANTECUBITAL Performed at [facility], [address] [place], [place] [address]    Special Requests   Final    BOTTLES DRAWN AEROBIC ONLY Blood Culture adequate volume Performed at [facility], [address], [place], [place] [address]    Culture   Final    NO GROWTH 5 DAYS Performed at [facility], [address] [place], [place] [address]    Report Status 01/20/2021 FINAL  Final  MRSA Next Gen by PCR, Nasal     Status: None   Collection Time: 01/16/21 10:26 AM   Specimen: Nasal Mucosa; Nasal Swab  Result Value Ref Range Status   MRSA by PCR Next Gen NOT DETECTED NOT DETECTED Final    Comment: (NOTE) The GeneXpert MRSA Assay (FDA approved for NASAL specimens only), is one component of a comprehensive MRSA colonization surveillance program. It is not intended to diagnose  MRSA infection nor to guide or monitor treatment for MRSA infections. Test performance is not FDA approved in patients less than 63 years old. Performed at [facility], [address], [place], [place] [address]      Time coordinating discharge: 35 minutes  SIGNED:   Jacquelin Hawking, MD [facility] 01/23/2021, 9:17 AM

## 2021-01-23 NOTE — Care Management Important Message (Signed)
Important Message  Patient Details IM Letter placed in Patients room. Name: Robert Carpenter MRN: [id number] Date of Birth: 1933/02/20   Medicare Important Message Given:  Yes     Caren Macadam 01/23/2021, 12:32 PM

## 2021-02-09 ENCOUNTER — Encounter (HOSPITAL_COMMUNITY): Payer: Self-pay | Admitting: Radiology

## 2021-07-13 ENCOUNTER — Telehealth: Payer: Self-pay

## 2021-07-13 NOTE — Telephone Encounter (Signed)
REFERRAL ONLY ?

## 2021-07-16 NOTE — Progress Notes (Unsigned)
Cardiology Office Note:    Date:  07/18/2021   ID:  Robert Carpenter, DOB [birth year], MRN [id number]  PCP:  Shayne Alken, MD   [facility] Providers Cardiologist:  Alverda Skeans, MD Referring MD: Luna Kitchens Delore*   Chief Complaint/Reason for Referral: Left bundle branch block  ASSESSMENT:    1. Bifascicular block   2. Chronic atrial fibrillation (HCC)   3. Primary hypertension   4. Cardiomyopathy, unspecified type (HCC)     PLAN:    In order of problems listed above: 1.  Bifascicular block: The patient is having no signs or symptoms suggestive of degeneration to high-grade heart block.  I will obtain an echocardiogram to evaluate his ejection fraction and I think it is likely improved since his hospitalization.  Follow-up in 6 months or earlier if needed 2.  Chronic atrial fibrillation: Continue Eliquis.  Reached the point where his dose needs to be decreased to 2.5 mg twice a day. 3.  Primary hypertension: Continue losartan.  Goal blood pressure less than 130/80 mmHg; he is near goal today. 4.  Cardiomyopathy: Given advanced age no further assessment regarding the etiology of the patient's cardiomyopathy is necessary.  I will obtain an echocardiogram to evaluate further.  Depending on this I am may start Toprol and or Jardiance.  Continue losartan.  He is euvolemic on exam.            Dispo:  Return in about 6 months (around 01/18/2022).      Medication Adjustments/Labs and Tests Ordered: Current medicines are reviewed at length with the patient today.  Concerns regarding medicines are outlined above.  The following changes have been made:  no change   Labs/tests ordered: Orders Placed This Encounter  Procedures   EKG 12-Lead   ECHOCARDIOGRAM COMPLETE    Medication Changes: No orders of the defined types were placed in this encounter.    Current medicines are reviewed at length with the patient today.  The patient does not have concerns  regarding medicines.   History of Present Illness:    FOCUSED PROBLEM LIST:   1.  COVID infection and cardiac arrest December 2021 requiring tracheostomy; course complicated by cardiac arrest.  He was noted to have DVTs and atrial fibrillation and started on Eliquis  2.  Atrial fibrillation on Eliquis CV 2 score of 4 3.  Hypertension 4.  Cardiomyopathy with ejection fraction 40 to 45% 5.  Nursing home resident 6.  Bifascicular block consisting of a first-degree and left bundle branch block  The patient is a 88 y.o. male with the indicated medical history here for for recommendations regarding a left bundle branch block that was incidentally noted.  The patient was referred by his nursing home facility.  On review of his chart the patient has been relatively debilitated since the COVID infection last year.  He was admitted to an outside hospital in December 2022 with COVID infection complicated by respiratory failure.  He was intubated and tracheostomy was ultimately placed.  He did code and had a PEA arrest when he inadvertently pulled out his tracheostomy.  The patient is doing fairly well.  His breathing is getting much better.  He denies any exertional dyspnea, exertional chest pain, presyncope, syncope, palpitations, paroxysmal nocturnal dyspnea, orthopnea.  He is tolerating his anticoagulation well without significant bleeding issues.  He has not required any emergency room visits or hospitalizations.  He is otherwise well without complaints.   Current Medications: Current Meds  Medication Sig  acetaminophen (TYLENOL) 325 MG tablet Take 650 mg by mouth every 6 (six) hours as needed for headache, fever or mild pain.   amantadine (SYMMETREL) 100 MG capsule Take 100 mg by mouth daily.   apixaban (ELIQUIS) 5 MG TABS tablet Take 5 mg by mouth 2 (two) times daily.   diclofenac Sodium (VOLTAREN) 1 % GEL Apply 2 g topically See admin instructions. Apply 2 grams to the left hip every 6 hours as  needed for pain   famotidine (PEPCID) 20 MG tablet Take 20 mg by mouth 2 (two) times daily.   losartan (COZAAR) 25 MG tablet Take 0.5 tablets (12.5 mg total) by mouth daily.   melatonin 3 MG TABS tablet Take 3 mg by mouth at bedtime.   mirtazapine (REMERON) 7.5 MG tablet Take 7.5 mg by mouth at bedtime.   Multiple Vitamin (MULTIVITAMIN WITH MINERALS) TABS tablet Take 1 tablet by mouth daily.   Omega-3 Fatty Acids (FISH OIL) 1000 MG CAPS Take 1,000 mg by mouth daily.   sennosides-docusate sodium (SENOKOT-S) 8.6-50 MG tablet Take 2 tablets by mouth at bedtime.   sertraline (ZOLOFT) 50 MG tablet Take 50 mg by mouth daily.     Allergies:    Patient has no known allergies.   Social History:   Social History   Tobacco Use   Smoking status: Former    Types: Cigarettes    Quit date: 1970    Years since quitting: 53.5   Smokeless tobacco: Never  Substance Use Topics   Alcohol use: Not Currently   Drug use: Never     Family Hx: History reviewed. No pertinent family history.   Review of Systems:   Please see the history of present illness.    All other systems reviewed and are negative.     EKGs/Labs/Other Test Reviewed:    EKG:  EKG performed January 2023 that I personally reviewed demonstrates wide-complex tachycardia; EKG performed today that I personally reviewed demonstrates sinus rhythm with first-degree AV block and left bundle branch block.  Prior CV studies:  TTE 2023 with ejection fraction of 40 to 45% with global hypokinesis no significant valvular abnormalities  Other studies Reviewed: Review of the additional studies/records demonstrates: None relevant  Recent Labs: 01/13/2021: ALT 176; Magnesium 1.7 01/18/2021: BUN 12; Hemoglobin 11.7; Platelets 268; Potassium 3.8; Sodium 135 01/22/2021: Creatinine, Ser 1.12   Recent Lipid Panel No results found for: "CHOL", "TRIG", "HDL", "LDLCALC", "LDLDIRECT"  Risk Assessment/Calculations:     CHA2DS2-VASc Score = 4    This indicates a 4.8% annual risk of stroke. The patient's score is based upon: CHF History: 1 HTN History: 1 Diabetes History: 0 Stroke History: 0 Vascular Disease History: 0 Age Score: 2 Gender Score: 0         Physical Exam:    VS:  BP 130/87   Pulse 63   Ht 6\' 2"  (1.88 m)   Wt 173 lb 3.2 oz (78.6 kg)   SpO2 98%   BMI 22.24 kg/m    Wt Readings from Last 3 Encounters:  07/18/21 173 lb 3.2 oz (78.6 kg)  01/21/21 152 lb 3.2 oz (69 kg)  06/30/20 160 lb 0.9 oz (72.6 kg)    GENERAL:  No apparent distress, AOx3 HEENT:  No carotid bruits, +2 carotid impulses, no scleral icterus CAR: RRR no murmurs, gallops, rubs, or thrills RES:  Clear to auscultation bilaterally ABD:  Soft, nontender, nondistended, positive bowel sounds x 4 VASC:  +2 radial pulses, +2 carotid pulses, palpable pedal  pulses NEURO:  CN 2-12 grossly intact; motor and sensory grossly intact PSYCH:  No active depression or anxiety EXT:  No edema, ecchymosis, or cyanosis  Signed, Orbie Pyo, MD  07/18/2021 3:14 PM    [facility] [address] [place], [place], [place]  [address] Phone: [phone]; Fax: [phone]   Note:  This document was prepared using Dragon voice recognition software and may include unintentional dictation errors.

## 2021-07-18 ENCOUNTER — Ambulatory Visit (INDEPENDENT_AMBULATORY_CARE_PROVIDER_SITE_OTHER): Payer: Medicare Other | Admitting: Internal Medicine

## 2021-07-18 ENCOUNTER — Encounter: Payer: Self-pay | Admitting: Internal Medicine

## 2021-07-18 VITALS — BP 130/87 | HR 63 | Ht 74.0 in | Wt 173.2 lb

## 2021-07-18 DIAGNOSIS — I452 Bifascicular block: Secondary | ICD-10-CM | POA: Diagnosis not present

## 2021-07-18 DIAGNOSIS — I429 Cardiomyopathy, unspecified: Secondary | ICD-10-CM | POA: Diagnosis not present

## 2021-07-18 DIAGNOSIS — I482 Chronic atrial fibrillation, unspecified: Secondary | ICD-10-CM | POA: Diagnosis not present

## 2021-07-18 DIAGNOSIS — I447 Left bundle-branch block, unspecified: Secondary | ICD-10-CM

## 2021-07-18 DIAGNOSIS — I10 Essential (primary) hypertension: Secondary | ICD-10-CM | POA: Diagnosis not present

## 2021-07-18 NOTE — Patient Instructions (Signed)
Medication Instructions:  No changes *If you need a refill on your cardiac medications before your next appointment, please call your pharmacy*   Lab Work: none If you have labs (blood work) drawn today and your tests are completely normal, you will receive your results only by: MyChart Message (if you have MyChart) OR A paper copy in the mail If you have any lab test that is abnormal or we need to change your treatment, we will call you to review the results.   Testing/Procedures: Your physician has requested that you have an echocardiogram. Echocardiography is a painless test that uses sound waves to create images of your heart. It provides your doctor with information about the size and shape of your heart and how well your heart's chambers and valves are working. This procedure takes approximately one hour. There are no restrictions for this procedure.   Follow-Up: At [facility], you and your health needs are our priority.  As part of our continuing mission to provide you with exceptional heart care, we have created designated Provider Care Teams.  These Care Teams include your primary Cardiologist (physician) and Advanced Practice Providers (APPs -  Physician Assistants and Nurse Practitioners) who all work together to provide you with the care you need, when you need it.  We recommend signing up for the patient portal called "MyChart".  Sign up information is provided on this After Visit Summary.  MyChart is used to connect with patients for Virtual Visits (Telemedicine).  Patients are able to view lab/test results, encounter notes, upcoming appointments, etc.  Non-urgent messages can be sent to your provider as well.   To learn more about what you can do with MyChart, go to [email or web address].    Your next appointment:   6 month(s)  The format for your next appointment:   In Person  Provider:   Alverda Skeans, MD   Other Instructions   Important Information About  [place]

## 2021-08-14 ENCOUNTER — Encounter (HOSPITAL_COMMUNITY): Payer: Self-pay | Admitting: Internal Medicine

## 2021-08-14 ENCOUNTER — Other Ambulatory Visit (HOSPITAL_COMMUNITY): Payer: Medicare Other

## 2021-08-14 ENCOUNTER — Ambulatory Visit (HOSPITAL_COMMUNITY): Payer: Medicare Other

## 2021-08-28 ENCOUNTER — Encounter (HOSPITAL_COMMUNITY): Payer: Self-pay | Admitting: Internal Medicine

## 2021-09-05 ENCOUNTER — Telehealth (HOSPITAL_COMMUNITY): Payer: Self-pay | Admitting: Internal Medicine

## 2021-09-05 NOTE — Telephone Encounter (Signed)
Just an FYI. We have made several attempts to contact this patient including sending a letter to schedule or reschedule their echocardiogram. We will be removing the patient from the echo WQ.  08/28/21 MAILED LETTER [facility]  08/14/2021 11:41 AM LT:[name], [name]  Cancel Rsn: No Show/Cancel within 24 hours     Thank you

## 2021-12-27 ENCOUNTER — Telehealth (HOSPITAL_COMMUNITY): Payer: Self-pay

## 2021-12-27 NOTE — Telephone Encounter (Signed)
Called MD office to schedule patients Modified Barium Swallow - NP will return call to schedule.

## 2021-12-29 ENCOUNTER — Other Ambulatory Visit (HOSPITAL_COMMUNITY): Payer: Self-pay

## 2021-12-29 DIAGNOSIS — R1310 Dysphagia, unspecified: Secondary | ICD-10-CM

## 2022-01-10 ENCOUNTER — Ambulatory Visit (HOSPITAL_COMMUNITY)
Admission: RE | Admit: 2022-01-10 | Discharge: 2022-01-10 | Disposition: A | Discharge status: Home / Self Care | Payer: Medicare Other | Source: Ambulatory Visit | Attending: Internal Medicine | Admitting: Internal Medicine

## 2022-01-10 DIAGNOSIS — J9621 Acute and chronic respiratory failure with hypoxia: Secondary | ICD-10-CM | POA: Insufficient documentation

## 2022-01-10 DIAGNOSIS — I482 Chronic atrial fibrillation, unspecified: Secondary | ICD-10-CM | POA: Insufficient documentation

## 2022-01-10 DIAGNOSIS — Z8674 Personal history of sudden cardiac arrest: Secondary | ICD-10-CM | POA: Diagnosis not present

## 2022-01-10 DIAGNOSIS — Z931 Gastrostomy status: Secondary | ICD-10-CM | POA: Insufficient documentation

## 2022-01-10 DIAGNOSIS — R1310 Dysphagia, unspecified: Secondary | ICD-10-CM

## 2022-01-10 DIAGNOSIS — R1312 Dysphagia, oropharyngeal phase: Secondary | ICD-10-CM | POA: Insufficient documentation

## 2022-01-10 DIAGNOSIS — I10 Essential (primary) hypertension: Secondary | ICD-10-CM | POA: Diagnosis not present

## 2022-01-10 DIAGNOSIS — Z86718 Personal history of other venous thrombosis and embolism: Secondary | ICD-10-CM | POA: Diagnosis not present

## 2022-01-10 DIAGNOSIS — R531 Weakness: Secondary | ICD-10-CM | POA: Insufficient documentation

## 2022-01-10 DIAGNOSIS — Z8616 Personal history of COVID-19: Secondary | ICD-10-CM | POA: Diagnosis not present

## 2022-01-10 NOTE — Therapy (Signed)
Modified Barium Swallow Progress Note  Patient Details  Name: Robert Carpenter MRN: [id number] Date of Birth: [birth year]  Today's Date: 01/10/2022  Modified Barium Swallow completed.  Full report located under Chart Review in the Imaging Section.  Brief recommendations include the following:  Clinical Impression  Barium consistencies tested include: thin liquid, puree solid, 13 mm barium tablet, mechanical soft solid. No penetration or aspiration observed with any of the tested consistencies. During oral phase, patient's mastication likely impacted by lack of dentition. He also exhibited weak lingual manipulation resulting in reduced anterior to posterior transit of liquid and solid boluses which lead to premature spillage of liquids into vallecular sinus. Patient exhibited piecemeal swallowing with liquids and solids which resulted in brief periods of vallecular and pyriform sinus residuals. Full clearance of pharyngeal residuals observed with subsequent swallows. Trace barium residuals observed just above UES suspicious for reduced UES opening. No retrograde movement of barium at level of cervical esophagus. SLP recommending Dys 3 (mechanical soft) solids secondary to lack of dentition and has not yet been fitted with dentures. Recommend SNF SLP work with patient and family on LRD.   Swallow Evaluation Recommendations       SLP Diet Recommendations: Dysphagia 3 (Mech soft) solids;Thin liquid   Liquid Administration via: Cup;Straw   Medication Administration: Whole meds with puree   Supervision: Patient able to self feed;Intermittent supervision to cue for compensatory strategies   Compensations: Slow rate;Small sips/bites;Minimize environmental distractions   Postural Changes: Seated upright at 90 degrees            Sonia Baller, MA, CCC-SLP Speech Therapy

## 2022-07-12 IMAGING — DX DG CHEST 1V PORT
1 series · 1 of 1 positions shown · non-contrast
Comparison: January 12, 2021

CLINICAL DATA: Cough and shortness of breath.

EXAM:
PORTABLE CHEST 1 VIEW

[chest ap]
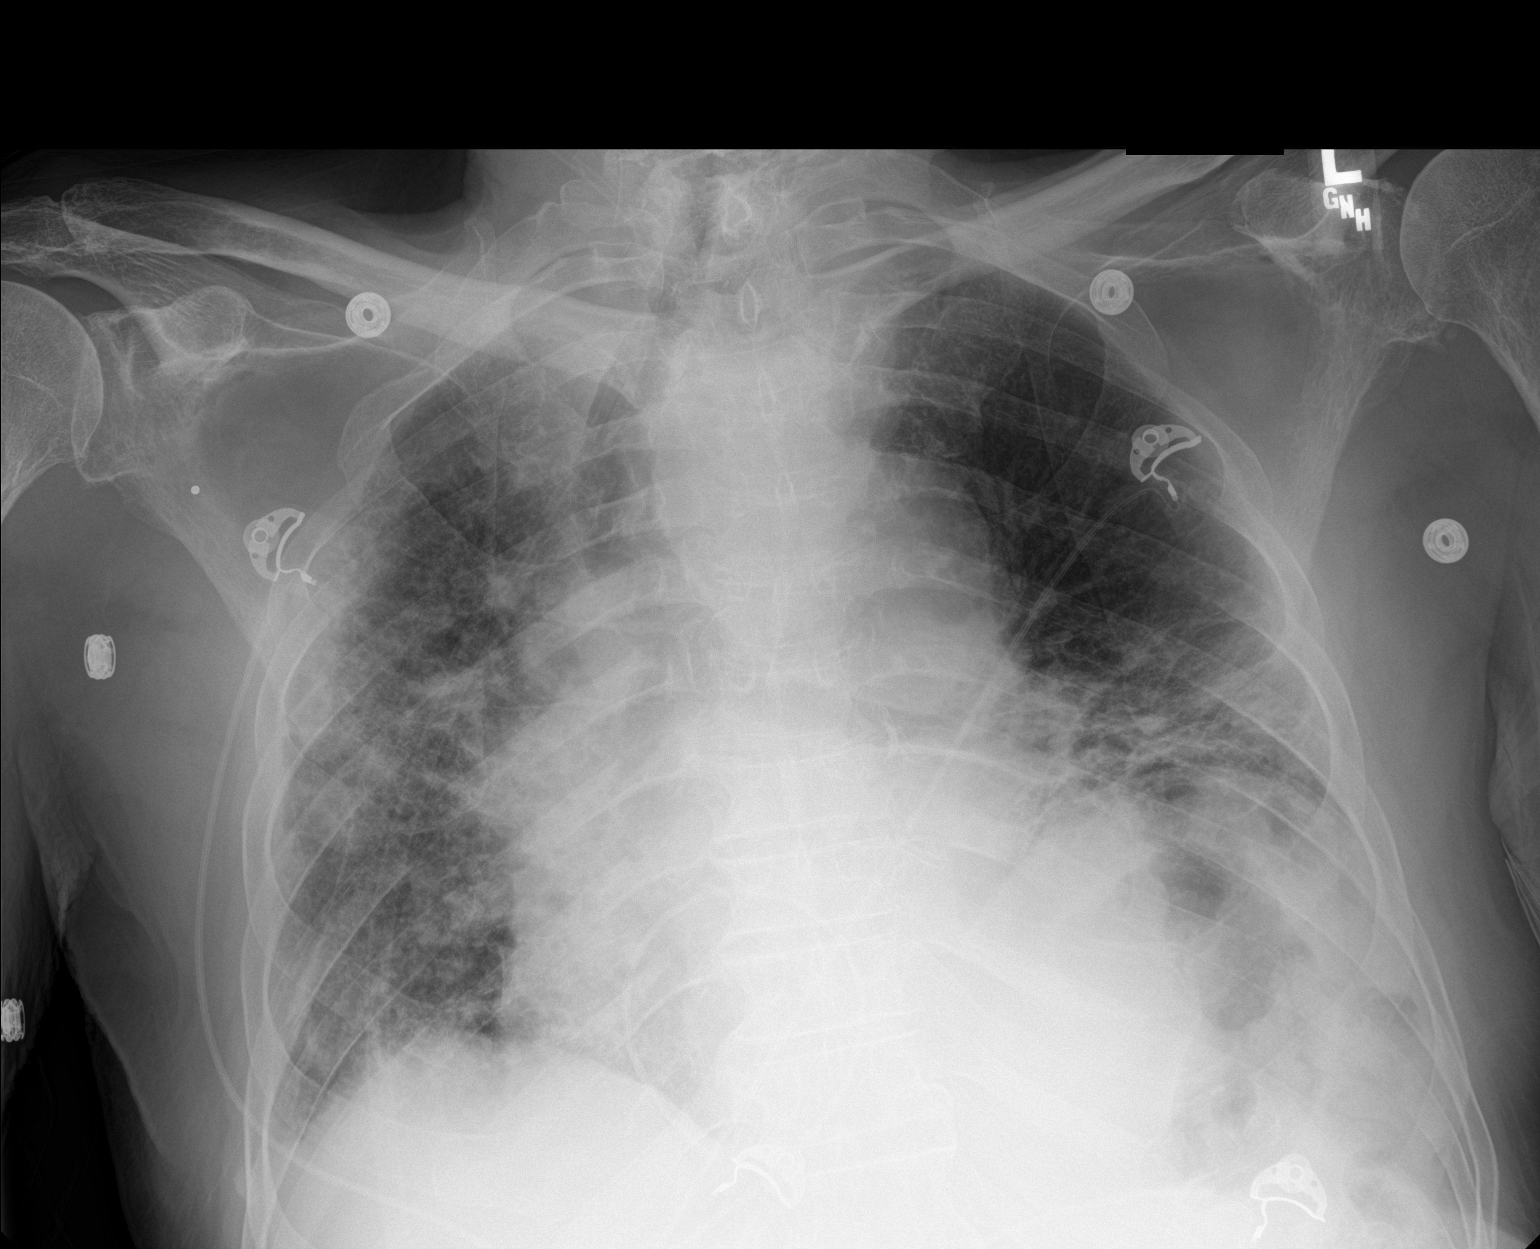

[1 of 1 positions shown; findings below may reference images not displayed]

FINDINGS: Stable deviation of trachea to the right. Persistent fullness of the
mediastinum.

Stable streaky airspace opacities in the left lung base. Persistent
patchy and reticular airspace opacities throughout the right lung.

Osseous structures are without acute abnormality. Soft tissues are
grossly normal.
IMPRESSION: 1. Stable streaky airspace opacities in the left lung base.
2. Persistent patchy and reticular airspace opacities throughout the
right lung.
3. Stable deviation of the trachea to the right.

## 2022-07-17 IMAGING — DX DG CHEST 1V PORT
1 series · 2 of 2 positions shown · non-contrast
Comparison: Chest radiograph 01/14/2021

CLINICAL DATA: Hypoxia

EXAM:
PORTABLE CHEST 1 VIEW

[Series 1: chest ap · 0.14mm/px · 2 of 2 slices shown]
[im 1/2]
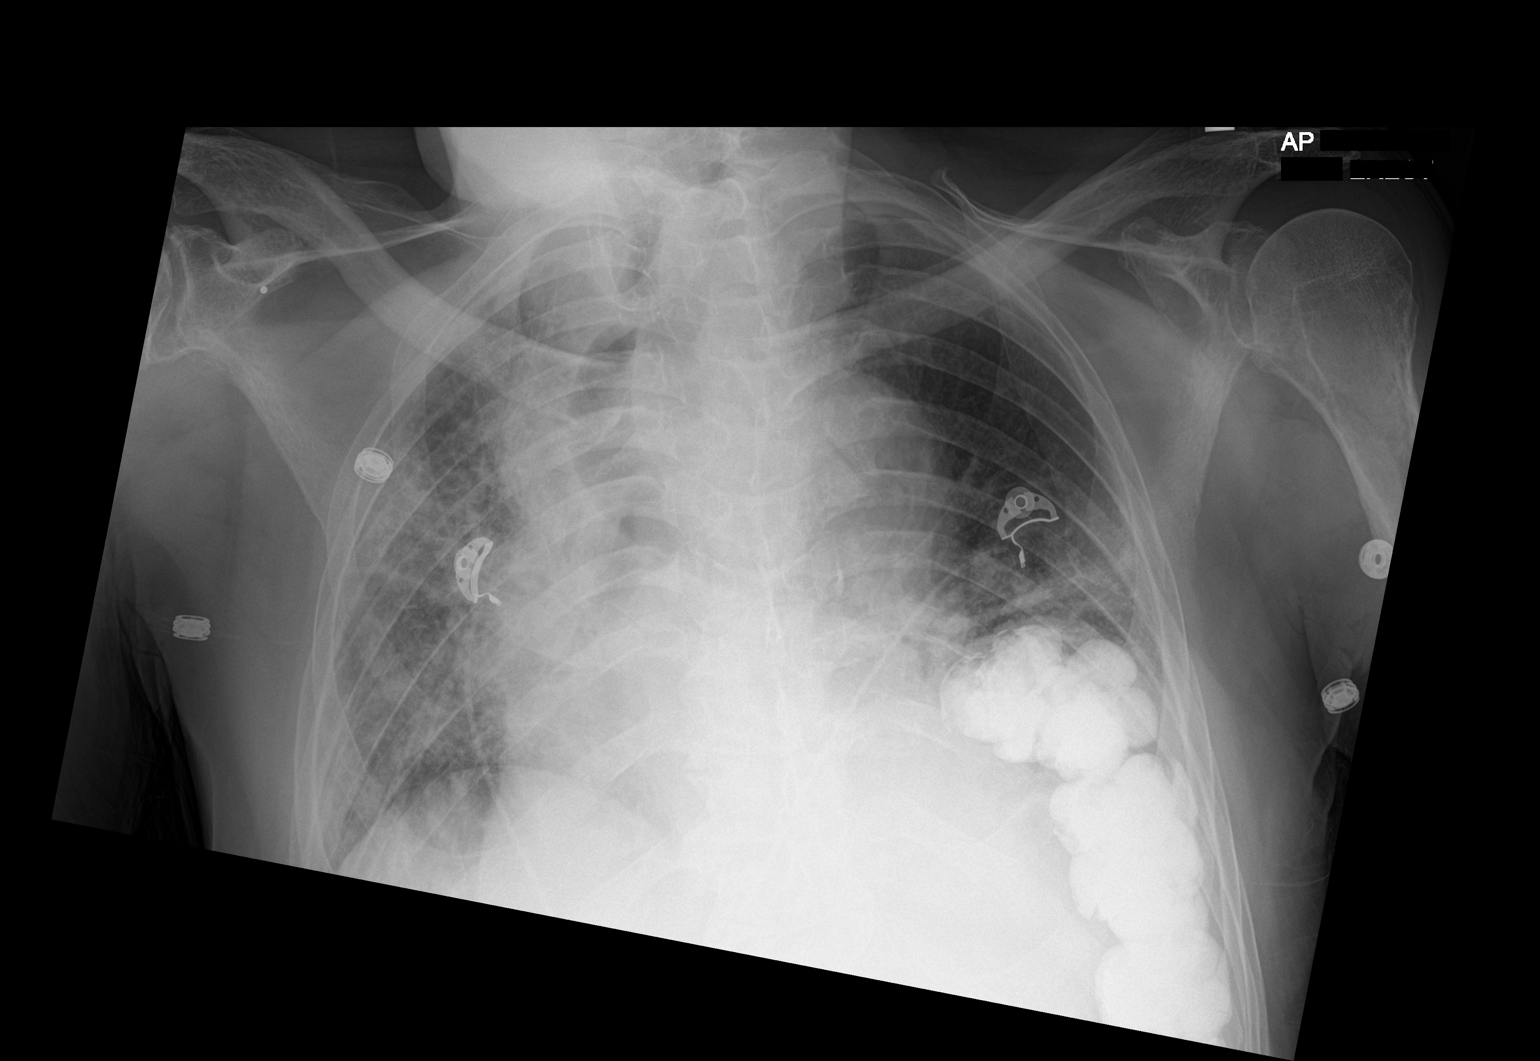
[im 2/2]
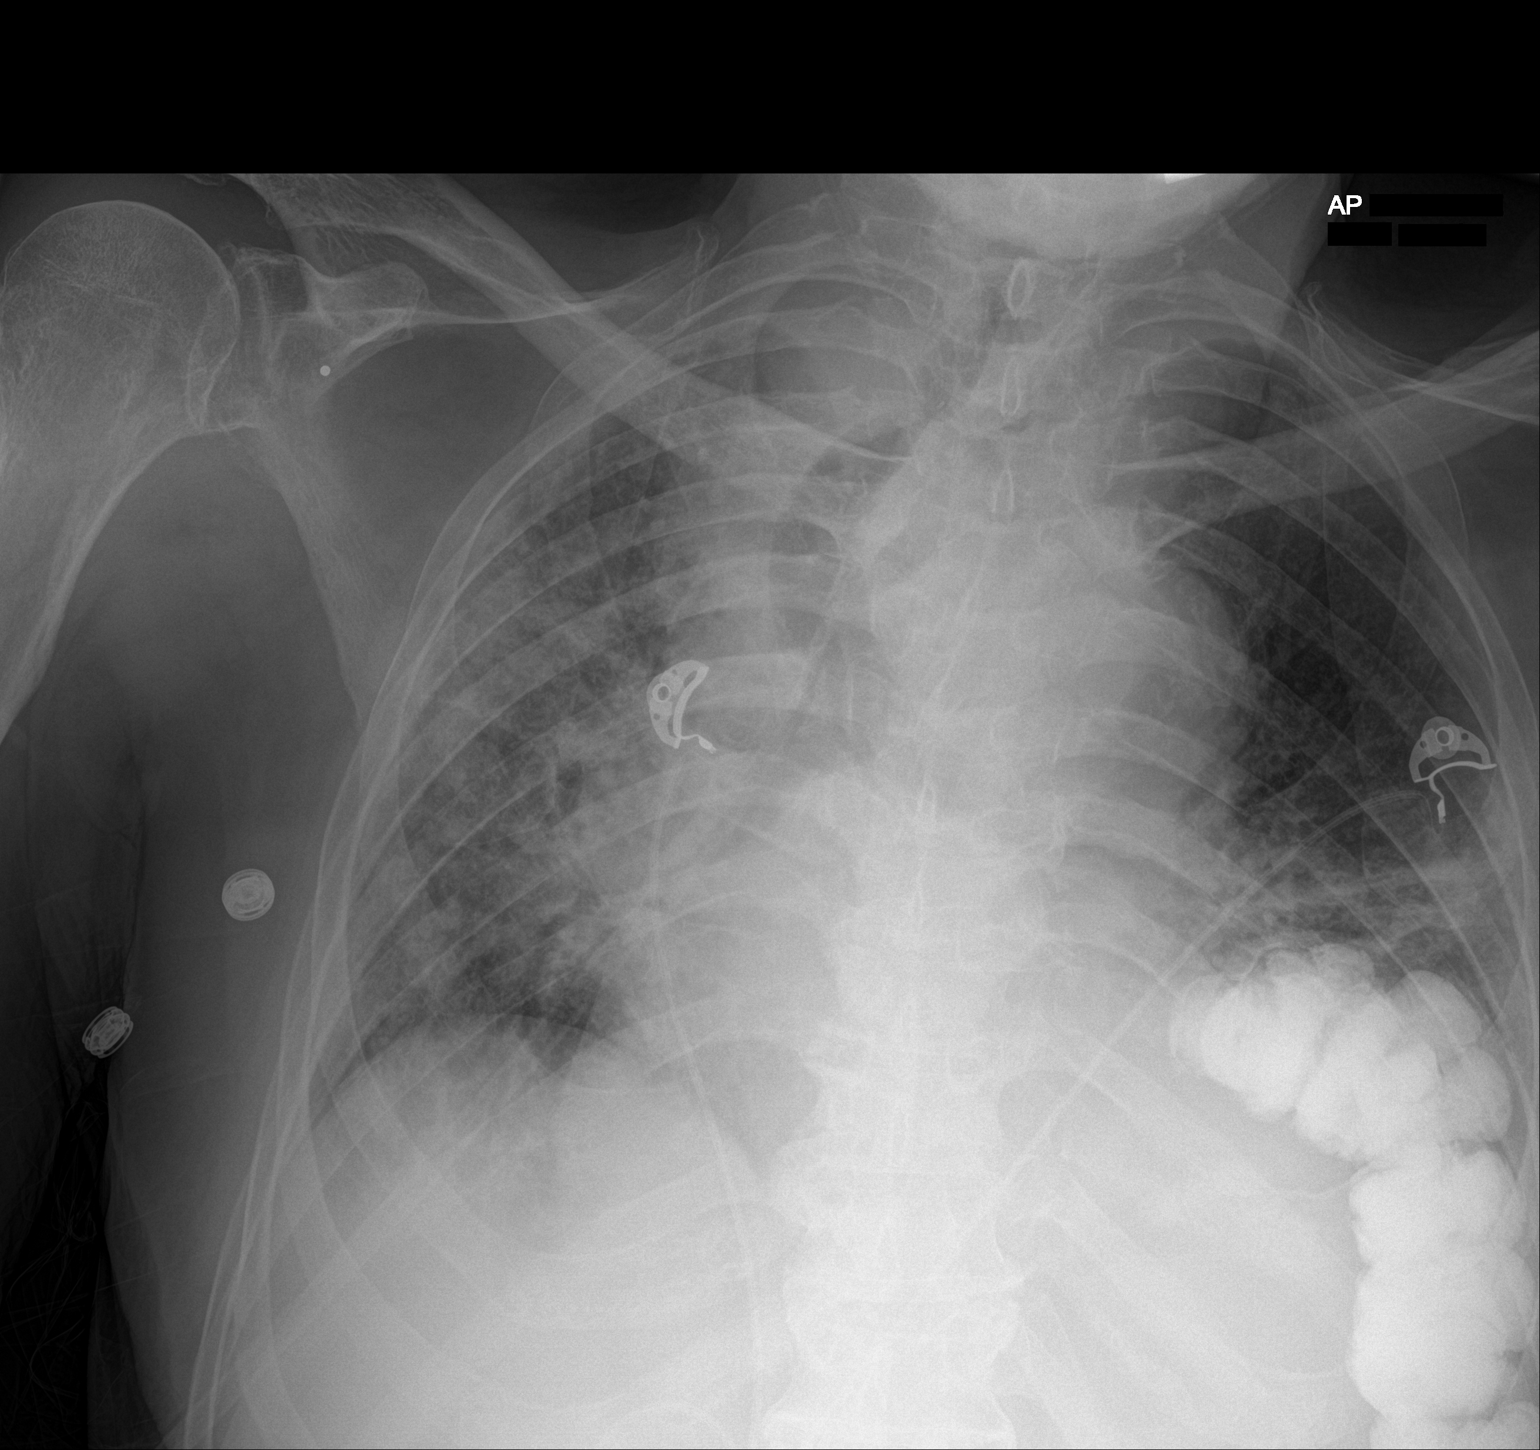

[2 of 2 positions shown; findings below may reference images not displayed]

FINDINGS: The heart is enlarged, unchanged. The upper mediastinal contours are
prominent, unchanged. Rightward tracheal deviation is unchanged.

Lung volumes are low. Patchy opacities throughout the right lung are
stable to slightly worsened in the interim. Linear opacities in the
left base are unchanged. The left upper lobe remains well-aerated.
There is no significant pleural effusion. There is no pneumothorax.

The bones are stable. Enteric contrast is seen in the left upper
quadrant.
IMPRESSION: 1. Patchy opacities throughout the right lung are overall stable to
slightly worsened in the interim. Linear opacity in the left base
are also unchanged and favored to reflect atelectasis.
2. Unchanged rightward tracheal deviation.

## 2022-11-24 ENCOUNTER — Emergency Department (HOSPITAL_COMMUNITY): Payer: Medicare Other

## 2022-11-24 ENCOUNTER — Inpatient Hospital Stay (HOSPITAL_COMMUNITY): Payer: Medicare Other

## 2022-11-24 ENCOUNTER — Inpatient Hospital Stay (HOSPITAL_COMMUNITY)
Admission: EM | Admit: 2022-11-24 | Discharge: 2022-12-04 | DRG: 871 | Disposition: A | Discharge status: Skilled Nursing Facility | Payer: Medicare Other | Source: Skilled Nursing Facility | Attending: Internal Medicine | Admitting: Internal Medicine

## 2022-11-24 ENCOUNTER — Encounter (HOSPITAL_COMMUNITY): Payer: Self-pay

## 2022-11-24 DIAGNOSIS — Z7901 Long term (current) use of anticoagulants: Secondary | ICD-10-CM

## 2022-11-24 DIAGNOSIS — J9691 Respiratory failure, unspecified with hypoxia: Secondary | ICD-10-CM | POA: Insufficient documentation

## 2022-11-24 DIAGNOSIS — U071 COVID-19: Secondary | ICD-10-CM | POA: Diagnosis present

## 2022-11-24 DIAGNOSIS — J1282 Pneumonia due to coronavirus disease 2019: Secondary | ICD-10-CM | POA: Diagnosis present

## 2022-11-24 DIAGNOSIS — R29818 Other symptoms and signs involving the nervous system: Secondary | ICD-10-CM | POA: Insufficient documentation

## 2022-11-24 DIAGNOSIS — J8410 Pulmonary fibrosis, unspecified: Secondary | ICD-10-CM | POA: Diagnosis present

## 2022-11-24 DIAGNOSIS — G9340 Encephalopathy, unspecified: Secondary | ICD-10-CM | POA: Insufficient documentation

## 2022-11-24 DIAGNOSIS — Z79899 Other long term (current) drug therapy: Secondary | ICD-10-CM

## 2022-11-24 DIAGNOSIS — Z515 Encounter for palliative care: Secondary | ICD-10-CM | POA: Diagnosis not present

## 2022-11-24 DIAGNOSIS — I447 Left bundle-branch block, unspecified: Secondary | ICD-10-CM | POA: Diagnosis present

## 2022-11-24 DIAGNOSIS — J690 Pneumonitis due to inhalation of food and vomit: Secondary | ICD-10-CM | POA: Diagnosis present

## 2022-11-24 DIAGNOSIS — R6521 Severe sepsis with septic shock: Secondary | ICD-10-CM | POA: Diagnosis not present

## 2022-11-24 DIAGNOSIS — R64 Cachexia: Secondary | ICD-10-CM | POA: Diagnosis present

## 2022-11-24 DIAGNOSIS — J9621 Acute and chronic respiratory failure with hypoxia: Secondary | ICD-10-CM | POA: Diagnosis present

## 2022-11-24 DIAGNOSIS — E875 Hyperkalemia: Secondary | ICD-10-CM | POA: Diagnosis present

## 2022-11-24 DIAGNOSIS — D6959 Other secondary thrombocytopenia: Secondary | ICD-10-CM | POA: Diagnosis present

## 2022-11-24 DIAGNOSIS — I4892 Unspecified atrial flutter: Secondary | ICD-10-CM | POA: Diagnosis present

## 2022-11-24 DIAGNOSIS — E8729 Other acidosis: Secondary | ICD-10-CM | POA: Diagnosis present

## 2022-11-24 DIAGNOSIS — J9602 Acute respiratory failure with hypercapnia: Secondary | ICD-10-CM | POA: Diagnosis present

## 2022-11-24 DIAGNOSIS — Z7951 Long term (current) use of inhaled steroids: Secondary | ICD-10-CM

## 2022-11-24 DIAGNOSIS — Z8616 Personal history of COVID-19: Secondary | ICD-10-CM | POA: Diagnosis not present

## 2022-11-24 DIAGNOSIS — N179 Acute kidney failure, unspecified: Secondary | ICD-10-CM | POA: Diagnosis present

## 2022-11-24 DIAGNOSIS — R0689 Other abnormalities of breathing: Secondary | ICD-10-CM | POA: Insufficient documentation

## 2022-11-24 DIAGNOSIS — R1312 Dysphagia, oropharyngeal phase: Secondary | ICD-10-CM | POA: Diagnosis present

## 2022-11-24 DIAGNOSIS — G9341 Metabolic encephalopathy: Secondary | ICD-10-CM | POA: Diagnosis present

## 2022-11-24 DIAGNOSIS — A4189 Other specified sepsis: Principal | ICD-10-CM | POA: Diagnosis present

## 2022-11-24 DIAGNOSIS — I5A Non-ischemic myocardial injury (non-traumatic): Secondary | ICD-10-CM | POA: Diagnosis present

## 2022-11-24 DIAGNOSIS — E43 Unspecified severe protein-calorie malnutrition: Secondary | ICD-10-CM | POA: Diagnosis present

## 2022-11-24 DIAGNOSIS — J9601 Acute respiratory failure with hypoxia: Secondary | ICD-10-CM | POA: Diagnosis not present

## 2022-11-24 DIAGNOSIS — Z8674 Personal history of sudden cardiac arrest: Secondary | ICD-10-CM | POA: Diagnosis not present

## 2022-11-24 DIAGNOSIS — R339 Retention of urine, unspecified: Secondary | ICD-10-CM | POA: Diagnosis not present

## 2022-11-24 DIAGNOSIS — Z87891 Personal history of nicotine dependence: Secondary | ICD-10-CM

## 2022-11-24 DIAGNOSIS — J849 Interstitial pulmonary disease, unspecified: Secondary | ICD-10-CM | POA: Insufficient documentation

## 2022-11-24 DIAGNOSIS — J9622 Acute and chronic respiratory failure with hypercapnia: Secondary | ICD-10-CM | POA: Diagnosis present

## 2022-11-24 DIAGNOSIS — R000 Tachycardia, unspecified: Secondary | ICD-10-CM | POA: Diagnosis not present

## 2022-11-24 DIAGNOSIS — R4182 Altered mental status, unspecified: Principal | ICD-10-CM

## 2022-11-24 DIAGNOSIS — Z751 Person awaiting admission to adequate facility elsewhere: Secondary | ICD-10-CM

## 2022-11-24 DIAGNOSIS — I110 Hypertensive heart disease with heart failure: Secondary | ICD-10-CM | POA: Diagnosis present

## 2022-11-24 DIAGNOSIS — I482 Chronic atrial fibrillation, unspecified: Secondary | ICD-10-CM | POA: Diagnosis present

## 2022-11-24 DIAGNOSIS — Z681 Body mass index (BMI) 19 or less, adult: Secondary | ICD-10-CM | POA: Diagnosis not present

## 2022-11-24 DIAGNOSIS — R4189 Other symptoms and signs involving cognitive functions and awareness: Secondary | ICD-10-CM | POA: Diagnosis present

## 2022-11-24 DIAGNOSIS — I5042 Chronic combined systolic (congestive) and diastolic (congestive) heart failure: Secondary | ICD-10-CM | POA: Diagnosis present

## 2022-11-24 DIAGNOSIS — F0390 Unspecified dementia without behavioral disturbance: Secondary | ICD-10-CM | POA: Diagnosis present

## 2022-11-24 DIAGNOSIS — Z86718 Personal history of other venous thrombosis and embolism: Secondary | ICD-10-CM

## 2022-11-24 DIAGNOSIS — Z7952 Long term (current) use of systemic steroids: Secondary | ICD-10-CM

## 2022-11-24 DIAGNOSIS — Z91199 Patient's noncompliance with other medical treatment and regimen due to unspecified reason: Secondary | ICD-10-CM

## 2022-11-24 LAB — CBC WITH DIFFERENTIAL/PLATELET
Abs Immature Granulocytes: 0.01 10*3/uL (ref 0.00–0.07)
Basophils Absolute: 0 10*3/uL (ref 0.0–0.1)
Basophils Relative: 0 %
Eosinophils Absolute: 0.1 10*3/uL (ref 0.0–0.5)
Eosinophils Relative: 1 %
HCT: 47 % (ref 39.0–52.0)
Hemoglobin: 15.5 g/dL (ref 13.0–17.0)
Immature Granulocytes: 0 %
Lymphocytes Relative: 25 %
Lymphs Abs: 1.3 10*3/uL (ref 0.7–4.0)
MCH: 31.7 pg (ref 26.0–34.0)
MCHC: 33 g/dL (ref 30.0–36.0)
MCV: 96.1 fL (ref 80.0–100.0)
Monocytes Absolute: 0.3 10*3/uL (ref 0.1–1.0)
Monocytes Relative: 6 %
Neutro Abs: 3.5 10*3/uL (ref 1.7–7.7)
Neutrophils Relative %: 68 %
Platelets: 170 10*3/uL (ref 150–400)
RBC: 4.89 MIL/uL (ref 4.22–5.81)
RDW: 15.7 % — ABNORMAL HIGH (ref 11.5–15.5)
WBC: 5.2 10*3/uL (ref 4.0–10.5)
nRBC: 0 % (ref 0.0–0.2)

## 2022-11-24 LAB — I-STAT CG4 LACTIC ACID, ED: Lactic Acid, Venous: 4.2 mmol/L (ref 0.5–1.9)

## 2022-11-24 LAB — I-STAT CHEM 8, ED
BUN: 24 mg/dL — ABNORMAL HIGH (ref 8–23)
Calcium, Ion: 1.15 mmol/L (ref 1.15–1.40)
Chloride: 101 mmol/L (ref 98–111)
Creatinine, Ser: 1.4 mg/dL — ABNORMAL HIGH (ref 0.61–1.24)
Glucose, Bld: 123 mg/dL — ABNORMAL HIGH (ref 70–99)
HCT: 53 % — ABNORMAL HIGH (ref 39.0–52.0)
Hemoglobin: 18 g/dL — ABNORMAL HIGH (ref 13.0–17.0)
Potassium: 4.1 mmol/L (ref 3.5–5.1)
Sodium: 143 mmol/L (ref 135–145)
TCO2: 32 mmol/L (ref 22–32)

## 2022-11-24 LAB — I-STAT VENOUS BLOOD GAS, ED
Acid-Base Excess: 8 mmol/L — ABNORMAL HIGH (ref 0.0–2.0)
Acid-Base Excess: 5 mmol/L — ABNORMAL HIGH (ref 0.0–2.0)
Bicarbonate: 37.8 mmol/L — ABNORMAL HIGH (ref 20.0–28.0)
Bicarbonate: 35.4 mmol/L — ABNORMAL HIGH (ref 20.0–28.0)
Calcium, Ion: 1.03 mmol/L — ABNORMAL LOW (ref 1.15–1.40)
Calcium, Ion: 1.18 mmol/L (ref 1.15–1.40)
HCT: 49 % (ref 39.0–52.0)
HCT: 54 % — ABNORMAL HIGH (ref 39.0–52.0)
Hemoglobin: 16.7 g/dL (ref 13.0–17.0)
Hemoglobin: 18.4 g/dL — ABNORMAL HIGH (ref 13.0–17.0)
O2 Saturation: 89 %
O2 Saturation: 38 %
Potassium: 7.8 mmol/L (ref 3.5–5.1)
Potassium: 4.1 mmol/L (ref 3.5–5.1)
Sodium: 138 mmol/L (ref 135–145)
Sodium: 144 mmol/L (ref 135–145)
TCO2: 40 mmol/L — ABNORMAL HIGH (ref 22–32)
TCO2: 38 mmol/L — ABNORMAL HIGH (ref 22–32)
pCO2, Ven: 75.3 mm[Hg] (ref 44–60)
pCO2, Ven: 77.8 mm[Hg] (ref 44–60)
pH, Ven: 7.309 (ref 7.25–7.43)
pH, Ven: 7.266 (ref 7.25–7.43)
pO2, Ven: 65 mm[Hg] — ABNORMAL HIGH (ref 32–45)
pO2, Ven: 27 mm[Hg] — CL (ref 32–45)

## 2022-11-24 LAB — URINALYSIS, W/ REFLEX TO CULTURE (INFECTION SUSPECTED)
Bacteria, UA: NONE SEEN
Bilirubin Urine: NEGATIVE
Glucose, UA: NEGATIVE mg/dL
Hgb urine dipstick: NEGATIVE
Ketones, ur: NEGATIVE mg/dL
Nitrite: NEGATIVE
Protein, ur: NEGATIVE mg/dL
Specific Gravity, Urine: 1.01 (ref 1.005–1.030)
pH: 5 (ref 5.0–8.0)

## 2022-11-24 LAB — BASIC METABOLIC PANEL
Anion gap: 11 (ref 5–15)
BUN: 17 mg/dL (ref 8–23)
CO2: 30 mmol/L (ref 22–32)
Calcium: 9.2 mg/dL (ref 8.9–10.3)
Chloride: 101 mmol/L (ref 98–111)
Creatinine, Ser: 1.51 mg/dL — ABNORMAL HIGH (ref 0.61–1.24)
GFR, Estimated: 44 mL/min — ABNORMAL LOW (ref >60)
Glucose, Bld: 125 mg/dL — ABNORMAL HIGH (ref 70–99)
Potassium: 4.1 mmol/L (ref 3.5–5.1)
Sodium: 142 mmol/L (ref 135–145)

## 2022-11-24 LAB — TROPONIN I (HIGH SENSITIVITY): Troponin I (High Sensitivity): 32 ng/L — ABNORMAL HIGH (ref <18)

## 2022-11-24 LAB — RESP PANEL BY RT-PCR (RSV, FLU A&B, COVID)  RVPGX2
Influenza A by PCR: NEGATIVE
Influenza B by PCR: NEGATIVE
Resp Syncytial Virus by PCR: NEGATIVE
SARS Coronavirus 2 by RT PCR: POSITIVE — AB

## 2022-11-24 LAB — HEPATIC FUNCTION PANEL
ALT: 30 U/L (ref 0–44)
AST: 25 U/L (ref 15–41)
Albumin: 3.4 g/dL — ABNORMAL LOW (ref 3.5–5.0)
Alkaline Phosphatase: 39 U/L (ref 38–126)
Bilirubin, Direct: 0.3 mg/dL — ABNORMAL HIGH (ref 0.0–0.2)
Indirect Bilirubin: 0.9 mg/dL (ref 0.3–0.9)
Total Bilirubin: 1.2 mg/dL — ABNORMAL HIGH (ref <1.2)
Total Protein: 6.6 g/dL (ref 6.5–8.1)

## 2022-11-24 LAB — CBG MONITORING, ED: Glucose-Capillary: 91 mg/dL (ref 70–99)

## 2022-11-24 LAB — AMMONIA: Ammonia: <10 umol/L (ref 9–35)

## 2022-11-24 LAB — LACTIC ACID, PLASMA: Lactic Acid, Venous: 5.1 mmol/L (ref 0.5–1.9)

## 2022-11-24 MED ORDER — LORAZEPAM 2 MG/ML IJ SOLN
1.0000 mg | Freq: Once | INTRAMUSCULAR | Status: AC
  Administered 2022-11-24: 1 mg via INTRAVENOUS
  Filled 2022-11-24: qty 1

## 2022-11-24 MED ORDER — AZITHROMYCIN 500 MG IV SOLR
500.0000 mg | Freq: Once | INTRAVENOUS | Status: AC
  Administered 2022-11-24: 500 mg via INTRAVENOUS
  Filled 2022-11-24: qty 5

## 2022-11-24 MED ORDER — MIRTAZAPINE 15 MG PO TABS: 7.5000 mg | ORAL_TABLET | Freq: Every day | ORAL | Status: DC

## 2022-11-24 MED ORDER — SODIUM CHLORIDE 0.9 % IV BOLUS: 1000.0000 mL | Freq: Once | INTRAVENOUS | Status: DC

## 2022-11-24 MED ORDER — HALOPERIDOL LACTATE 5 MG/ML IJ SOLN
5.0000 mg | Freq: Once | INTRAMUSCULAR | Status: AC
  Administered 2022-11-24: 5 mg via INTRAVENOUS
  Filled 2022-11-24: qty 1

## 2022-11-24 MED ORDER — FAMOTIDINE 20 MG PO TABS: 20.0000 mg | ORAL_TABLET | Freq: Every day | ORAL | Status: DC

## 2022-11-24 MED ORDER — DIAZEPAM 5 MG/ML IJ SOLN
5.0000 mg | Freq: Once | INTRAMUSCULAR | Status: AC
Start: 2022-11-24 — End: 2022-11-24
  Administered 2022-11-24: 5 mg via INTRAVENOUS
  Filled 2022-11-24: qty 2

## 2022-11-24 MED ORDER — SODIUM CHLORIDE 0.9 % IV SOLN
1.0000 g | INTRAVENOUS | Status: AC
  Administered 2022-11-25 – 2022-11-28 (×4): 1 g via INTRAVENOUS
  Filled 2022-11-24 (×4): qty 10

## 2022-11-24 MED ORDER — DIAZEPAM 5 MG/ML IJ SOLN
5.0000 mg | Freq: Once | INTRAMUSCULAR | Status: AC
  Administered 2022-11-24: 5 mg via INTRAVENOUS
  Filled 2022-11-24: qty 2

## 2022-11-24 MED ORDER — INSULIN ASPART 100 UNIT/ML IV SOLN: 5.0000 [IU] | Freq: Once | INTRAVENOUS | Status: DC

## 2022-11-24 MED ORDER — ALBUTEROL SULFATE (2.5 MG/3ML) 0.083% IN NEBU
5.0000 mg | INHALATION_SOLUTION | Freq: Once | RESPIRATORY_TRACT | Status: AC
Start: 2022-11-24 — End: 2022-11-24
  Administered 2022-11-24: 5 mg via RESPIRATORY_TRACT
  Filled 2022-11-24: qty 6

## 2022-11-24 MED ORDER — METHYLPREDNISOLONE SODIUM SUCC 125 MG IJ SOLR
125.0000 mg | Freq: Once | INTRAMUSCULAR | Status: AC
  Administered 2022-11-24: 125 mg via INTRAVENOUS
  Filled 2022-11-24: qty 2

## 2022-11-24 MED ORDER — FUROSEMIDE 10 MG/ML IJ SOLN
40.0000 mg | Freq: Once | INTRAMUSCULAR | Status: AC
  Administered 2022-11-24: 40 mg via INTRAVENOUS
  Filled 2022-11-24: qty 4

## 2022-11-24 MED ORDER — SODIUM CHLORIDE 0.9 % IV BOLUS
1000.0000 mL | Freq: Once | INTRAVENOUS | Status: AC
  Administered 2022-11-24: 1000 mL via INTRAVENOUS

## 2022-11-24 MED ORDER — DEXAMETHASONE SODIUM PHOSPHATE 10 MG/ML IJ SOLN
6.0000 mg | INTRAMUSCULAR | Status: DC
  Administered 2022-11-25 – 2022-12-01 (×7): 6 mg via INTRAVENOUS
  Filled 2022-11-24 (×7): qty 1

## 2022-11-24 MED ORDER — SODIUM CHLORIDE 0.9 % IV SOLN
100.0000 mg | Freq: Every day | INTRAVENOUS | Status: AC
  Administered 2022-11-26 – 2022-11-27 (×2): 100 mg via INTRAVENOUS
  Filled 2022-11-24 (×2): qty 20

## 2022-11-24 MED ORDER — OLANZAPINE 5 MG PO TBDP: 5.0000 mg | ORAL_TABLET | Freq: Two times a day (BID) | ORAL | Status: DC | PRN

## 2022-11-24 MED ORDER — IPRATROPIUM-ALBUTEROL 0.5-2.5 (3) MG/3ML IN SOLN
3.0000 mL | Freq: Four times a day (QID) | RESPIRATORY_TRACT | Status: DC
  Administered 2022-11-25 – 2022-11-26 (×6): 3 mL via RESPIRATORY_TRACT
  Filled 2022-11-24 (×6): qty 3

## 2022-11-24 MED ORDER — IOHEXOL 350 MG/ML SOLN
60.0000 mL | Freq: Once | INTRAVENOUS | Status: AC | PRN
  Administered 2022-11-24: 60 mL via INTRAVENOUS

## 2022-11-24 MED ORDER — CALCIUM GLUCONATE-NACL 1-0.675 GM/50ML-% IV SOLN
1.0000 g | Freq: Once | INTRAVENOUS | Status: AC
  Administered 2022-11-24: 1000 mg via INTRAVENOUS
  Filled 2022-11-24: qty 50

## 2022-11-24 MED ORDER — SODIUM CHLORIDE 0.9 % IV SOLN
500.0000 mg | INTRAVENOUS | Status: AC
  Administered 2022-11-25 – 2022-11-26 (×2): 500 mg via INTRAVENOUS
  Filled 2022-11-24 (×2): qty 5

## 2022-11-24 MED ORDER — SODIUM CHLORIDE 0.9% FLUSH
3.0000 mL | Freq: Two times a day (BID) | INTRAVENOUS | Status: DC
  Administered 2022-11-25 – 2022-11-29 (×10): 3 mL via INTRAVENOUS

## 2022-11-24 MED ORDER — ALBUTEROL SULFATE (2.5 MG/3ML) 0.083% IN NEBU: 2.5000 mg | INHALATION_SOLUTION | Freq: Four times a day (QID) | RESPIRATORY_TRACT | Status: DC

## 2022-11-24 MED ORDER — BISACODYL 10 MG RE SUPP: 10.0000 mg | Freq: Every day | RECTAL | Status: DC | PRN

## 2022-11-24 MED ORDER — DEXTROSE 50 % IV SOLN: 1.0000 | Freq: Once | INTRAVENOUS | Status: DC

## 2022-11-24 MED ORDER — ACETAMINOPHEN 10 MG/ML IV SOLN: 1000.0000 mg | Freq: Four times a day (QID) | INTRAVENOUS | Status: AC | PRN

## 2022-11-24 MED ORDER — APIXABAN 5 MG PO TABS: 5.0000 mg | ORAL_TABLET | Freq: Two times a day (BID) | ORAL | Status: DC

## 2022-11-24 MED ORDER — SODIUM CHLORIDE 0.9 % IV BOLUS: 500.0000 mL | Freq: Once | INTRAVENOUS | Status: DC

## 2022-11-24 MED ORDER — SODIUM CHLORIDE 0.9 % IV SOLN
1.0000 g | Freq: Once | INTRAVENOUS | Status: AC
Start: 2022-11-24 — End: 2022-11-24
  Administered 2022-11-24: 1 g via INTRAVENOUS
  Filled 2022-11-24: qty 10

## 2022-11-24 MED ORDER — ALBUTEROL SULFATE (2.5 MG/3ML) 0.083% IN NEBU: 2.5000 mg | INHALATION_SOLUTION | RESPIRATORY_TRACT | Status: DC | PRN

## 2022-11-24 MED ORDER — FLUTICASONE FUROATE-VILANTEROL 200-25 MCG/ACT IN AEPB: 1.0000 | INHALATION_SPRAY | Freq: Every day | RESPIRATORY_TRACT | Status: DC

## 2022-11-24 MED ORDER — SODIUM CHLORIDE 0.9 % IV SOLN
200.0000 mg | Freq: Once | INTRAVENOUS | Status: AC
  Administered 2022-11-25: 200 mg via INTRAVENOUS
  Filled 2022-11-24: qty 40

## 2022-11-24 MED ORDER — SODIUM CHLORIDE 0.9 % IV BOLUS
500.0000 mL | Freq: Once | INTRAVENOUS | Status: AC
  Administered 2022-11-24: 500 mL via INTRAVENOUS

## 2022-11-24 MED ORDER — SERTRALINE HCL 50 MG PO TABS: 25.0000 mg | ORAL_TABLET | Freq: Every day | ORAL | Status: DC

## 2022-11-24 MED ORDER — AMANTADINE HCL 100 MG PO CAPS: 100.0000 mg | ORAL_CAPSULE | Freq: Every day | ORAL | Status: DC

## 2022-11-24 MED ORDER — HALOPERIDOL LACTATE 5 MG/ML IJ SOLN
2.0000 mg | Freq: Once | INTRAMUSCULAR | Status: AC
Start: 2022-11-24 — End: 2022-11-24
  Administered 2022-11-24: 2 mg via INTRAVENOUS
  Filled 2022-11-24: qty 1
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
## 2022-11-24 NOTE — ED Triage Notes (Signed)
Patient from [facility] for AMS. Patient LKW last night and was noticed to be more lethargic at breakfast this morning at breakfast, not responding appropriately. Patient normally A/O x 4 and fully conversational.

## 2022-11-24 NOTE — ED Provider Notes (Signed)
  Physical Exam  BP (!) 157/114 (BP Location: Left Arm)   Pulse (!) 128   Temp (!) 97.3 F (36.3 C) (Rectal)   Resp 18   SpO2 92%   Physical Exam  Procedures  Procedures  ED Course / MDM    Medical Decision Making Care assumed at 3 PM.  Patient is from nursing home and was found to be altered.  Patient was hypothermic initially.  Patient persistently tachycardic.  Signout pending labs and urinalysis and chest x-ray.  Patient was on BiPAP initially.  4 pm Patient was taken off BiPAP and repeat VBG is improved.  Patient also has been taking off his BiPAP multiple times.  Chest x-ray is clear but he has history of pneumonia and is persistently tachycardic.  I ordered lactate and cultures and CTA chest.  7 pm Patient moved around and lost IV access.  I placed another IV and gave him Valium for sedation as he is climbing out of bed  9:26 PM Patient's lactate is 4.  He is persistently tachycardic.  His COVID test is positive.  He received Rocephin and azithromycin for pneumonia already.  CTA did not show any obvious PE but does have bilateral pleural effusions.  At this point, hospitalist to admit for persistent tachycardia, altered mental status likely from COVID  Problems Addressed: Altered mental status, unspecified altered mental status type: acute illness or injury COVID-19: acute illness or injury Sinus tachycardia: acute illness or injury  Amount and/or Complexity of Data Reviewed Labs: ordered. Decision-making details documented in ED Course. Radiology: ordered and independent interpretation performed. Decision-making details documented in ED Course. ECG/medicine tests: ordered and independent interpretation performed. Decision-making details documented in ED Course.  Risk Prescription drug management. Decision regarding hospitalization.          Charlynne Pander, MD 11/24/22 2127

## 2022-11-24 NOTE — Progress Notes (Signed)
Pt found with BiPAP off but is showing no signs of distress at this time. MD notified, RN aware.

## 2022-11-24 NOTE — ED Notes (Signed)
Son Tasheem Condron [phone] would like an update asap

## 2022-11-24 NOTE — Progress Notes (Signed)
Pt was placed on BiPAP 12/5 40% and is tolerating well at this time.

## 2022-11-24 NOTE — ED Notes (Signed)
Lab informed of needed blood,
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
## 2022-11-24 NOTE — ED Notes (Signed)
[facility] informed of loss of last access
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
## 2022-11-25 DIAGNOSIS — G9340 Encephalopathy, unspecified: Secondary | ICD-10-CM | POA: Insufficient documentation

## 2022-11-25 DIAGNOSIS — J9601 Acute respiratory failure with hypoxia: Secondary | ICD-10-CM

## 2022-11-25 DIAGNOSIS — R4182 Altered mental status, unspecified: Secondary | ICD-10-CM

## 2022-11-25 DIAGNOSIS — U071 COVID-19: Secondary | ICD-10-CM | POA: Diagnosis not present

## 2022-11-25 DIAGNOSIS — J9602 Acute respiratory failure with hypercapnia: Secondary | ICD-10-CM

## 2022-11-25 DIAGNOSIS — J1282 Pneumonia due to coronavirus disease 2019: Secondary | ICD-10-CM | POA: Diagnosis not present

## 2022-11-25 DIAGNOSIS — J849 Interstitial pulmonary disease, unspecified: Secondary | ICD-10-CM | POA: Insufficient documentation

## 2022-11-25 DIAGNOSIS — R4189 Other symptoms and signs involving cognitive functions and awareness: Secondary | ICD-10-CM | POA: Insufficient documentation

## 2022-11-25 DIAGNOSIS — I4892 Unspecified atrial flutter: Secondary | ICD-10-CM | POA: Insufficient documentation

## 2022-11-25 DIAGNOSIS — R0689 Other abnormalities of breathing: Secondary | ICD-10-CM | POA: Insufficient documentation

## 2022-11-25 DIAGNOSIS — J9691 Respiratory failure, unspecified with hypoxia: Secondary | ICD-10-CM | POA: Insufficient documentation

## 2022-11-25 LAB — POCT I-STAT 7, (LYTES, BLD GAS, ICA,H+H)
Acid-Base Excess: 2 mmol/L (ref 0.0–2.0)
Acid-Base Excess: 5 mmol/L — ABNORMAL HIGH (ref 0.0–2.0)
Acid-base deficit: 2 mmol/L (ref 0.0–2.0)
Bicarbonate: 28.3 mmol/L — ABNORMAL HIGH (ref 20.0–28.0)
Bicarbonate: 32.5 mmol/L — ABNORMAL HIGH (ref 20.0–28.0)
Bicarbonate: 31 mmol/L — ABNORMAL HIGH (ref 20.0–28.0)
Calcium, Ion: 1.24 mmol/L (ref 1.15–1.40)
Calcium, Ion: 1.3 mmol/L (ref 1.15–1.40)
Calcium, Ion: 1.24 mmol/L (ref 1.15–1.40)
HCT: 47 % (ref 39.0–52.0)
HCT: 43 % (ref 39.0–52.0)
HCT: 41 % (ref 39.0–52.0)
Hemoglobin: 16 g/dL (ref 13.0–17.0)
Hemoglobin: 14.6 g/dL (ref 13.0–17.0)
Hemoglobin: 13.9 g/dL (ref 13.0–17.0)
O2 Saturation: 98 %
O2 Saturation: 99 %
O2 Saturation: 98 %
Patient temperature: 98
Patient temperature: 37.2
Patient temperature: 37.4
Potassium: 4.9 mmol/L (ref 3.5–5.1)
Potassium: 4.5 mmol/L (ref 3.5–5.1)
Potassium: 4.4 mmol/L (ref 3.5–5.1)
Sodium: 142 mmol/L (ref 135–145)
Sodium: 140 mmol/L (ref 135–145)
Sodium: 143 mmol/L (ref 135–145)
TCO2: 30 mmol/L (ref 22–32)
TCO2: 35 mmol/L — ABNORMAL HIGH (ref 22–32)
TCO2: 33 mmol/L — ABNORMAL HIGH (ref 22–32)
pCO2 arterial: 69.3 mm[Hg] (ref 32–48)
pCO2 arterial: 76.1 mm[Hg] (ref 32–48)
pCO2 arterial: 53.6 mm[Hg] — ABNORMAL HIGH (ref 32–48)
pH, Arterial: 7.217 — ABNORMAL LOW (ref 7.35–7.45)
pH, Arterial: 7.239 — ABNORMAL LOW (ref 7.35–7.45)
pH, Arterial: 7.373 (ref 7.35–7.45)
pO2, Arterial: 129 mm[Hg] — ABNORMAL HIGH (ref 83–108)
pO2, Arterial: 155 mm[Hg] — ABNORMAL HIGH (ref 83–108)
pO2, Arterial: 115 mm[Hg] — ABNORMAL HIGH (ref 83–108)

## 2022-11-25 LAB — CBC
HCT: 52.8 % — ABNORMAL HIGH (ref 39.0–52.0)
Hemoglobin: 16.6 g/dL (ref 13.0–17.0)
MCH: 31.6 pg (ref 26.0–34.0)
MCHC: 31.4 g/dL (ref 30.0–36.0)
MCV: 100.4 fL — ABNORMAL HIGH (ref 80.0–100.0)
Platelets: 153 10*3/uL (ref 150–400)
RBC: 5.26 MIL/uL (ref 4.22–5.81)
RDW: 15.8 % — ABNORMAL HIGH (ref 11.5–15.5)
WBC: 14.2 10*3/uL — ABNORMAL HIGH (ref 4.0–10.5)
nRBC: 0 % (ref 0.0–0.2)

## 2022-11-25 LAB — LACTIC ACID, PLASMA
Lactic Acid, Venous: 6.7 mmol/L (ref 0.5–1.9)
Lactic Acid, Venous: 3.9 mmol/L (ref 0.5–1.9)

## 2022-11-25 LAB — GLUCOSE, CAPILLARY
Glucose-Capillary: 106 mg/dL — ABNORMAL HIGH (ref 70–99)
Glucose-Capillary: 69 mg/dL — ABNORMAL LOW (ref 70–99)
Glucose-Capillary: 221 mg/dL — ABNORMAL HIGH (ref 70–99)
Glucose-Capillary: 157 mg/dL — ABNORMAL HIGH (ref 70–99)
Glucose-Capillary: 75 mg/dL (ref 70–99)
Glucose-Capillary: 50 mg/dL — ABNORMAL LOW (ref 70–99)

## 2022-11-25 LAB — BLOOD GAS, VENOUS
Acid-base deficit: 1.2 mmol/L (ref 0.0–2.0)
Bicarbonate: 28.2 mmol/L — ABNORMAL HIGH (ref 20.0–28.0)
O2 Saturation: 49.4 %
Patient temperature: 37
pCO2, Ven: 69 mm[Hg] — ABNORMAL HIGH (ref 44–60)
pH, Ven: 7.22 — ABNORMAL LOW (ref 7.25–7.43)
pO2, Ven: 33 mm[Hg] (ref 32–45)

## 2022-11-25 LAB — PHOSPHORUS: Phosphorus: 4.9 mg/dL — ABNORMAL HIGH (ref 2.5–4.6)

## 2022-11-25 LAB — BASIC METABOLIC PANEL
Anion gap: 18 — ABNORMAL HIGH (ref 5–15)
BUN: 23 mg/dL (ref 8–23)
CO2: 20 mmol/L — ABNORMAL LOW (ref 22–32)
Calcium: 9 mg/dL (ref 8.9–10.3)
Chloride: 104 mmol/L (ref 98–111)
Creatinine, Ser: 1.72 mg/dL — ABNORMAL HIGH (ref 0.61–1.24)
GFR, Estimated: 38 mL/min — ABNORMAL LOW (ref >60)
Glucose, Bld: 99 mg/dL (ref 70–99)
Potassium: 5.2 mmol/L — ABNORMAL HIGH (ref 3.5–5.1)
Sodium: 142 mmol/L (ref 135–145)

## 2022-11-25 LAB — BRAIN NATRIURETIC PEPTIDE: B Natriuretic Peptide: 697.1 pg/mL — ABNORMAL HIGH (ref 0.0–100.0)

## 2022-11-25 LAB — MRSA NEXT GEN BY PCR, NASAL: MRSA by PCR Next Gen: DETECTED — AB

## 2022-11-25 LAB — MAGNESIUM: Magnesium: 2.1 mg/dL (ref 1.7–2.4)

## 2022-11-25 LAB — TROPONIN I (HIGH SENSITIVITY): Troponin I (High Sensitivity): 42 ng/L — ABNORMAL HIGH (ref <18)

## 2022-11-25 MED ORDER — DEXTROSE 50 % IV SOLN: 25.0000 mL | Freq: Once | INTRAVENOUS | Status: AC

## 2022-11-25 MED ORDER — DEXTROSE-SODIUM CHLORIDE 5-0.9 % IV SOLN: INTRAVENOUS | Status: DC

## 2022-11-25 MED ORDER — HEPARIN (PORCINE) 25000 UT/250ML-% IV SOLN
900.0000 [IU]/h | INTRAVENOUS | Status: DC
Start: 2022-11-25 — End: 2022-11-26
  Administered 2022-11-25: 900 [IU]/h via INTRAVENOUS
  Filled 2022-11-25: qty 250

## 2022-11-25 MED ORDER — DEXTROSE 50 % IV SOLN: 12.5000 g | INTRAVENOUS | Status: AC

## 2022-11-25 MED ORDER — ORAL CARE MOUTH RINSE
15.0000 mL | OROMUCOSAL | Status: DC
  Administered 2022-11-25 – 2022-11-27 (×11): 15 mL via OROMUCOSAL

## 2022-11-25 MED ORDER — DEXTROSE 50 % IV SOLN
INTRAVENOUS | Status: AC
  Administered 2022-11-25: 12.5 g via INTRAVENOUS
  Filled 2022-11-25: qty 50

## 2022-11-25 MED ORDER — MUPIROCIN 2 % EX OINT
1.0000 | TOPICAL_OINTMENT | Freq: Two times a day (BID) | CUTANEOUS | Status: AC
  Administered 2022-11-25 – 2022-11-29 (×10): 1 via NASAL
  Filled 2022-11-25 (×3): qty 22

## 2022-11-25 MED ORDER — HEPARIN BOLUS VIA INFUSION
3000.0000 [IU] | Freq: Once | INTRAVENOUS | Status: AC
  Administered 2022-11-25: 3000 [IU] via INTRAVENOUS
  Filled 2022-11-25: qty 3000

## 2022-11-25 MED ORDER — DEXMEDETOMIDINE HCL IN NACL 400 MCG/100ML IV SOLN
0.0000 ug/kg/h | INTRAVENOUS | Status: DC
  Administered 2022-11-25: 0.4 ug/kg/h via INTRAVENOUS
  Administered 2022-11-26 (×2): 0.2 ug/kg/h via INTRAVENOUS
  Administered 2022-11-28: 0.4 ug/kg/h via INTRAVENOUS
  Administered 2022-11-28: 0.2 ug/kg/h via INTRAVENOUS
  Filled 2022-11-25 (×4): qty 100

## 2022-11-25 MED ORDER — DEXTROSE IN LACTATED RINGERS 5 % IV SOLN: INTRAVENOUS | Status: AC

## 2022-11-25 MED ORDER — DEXTROSE 50 % IV SOLN
INTRAVENOUS | Status: AC
  Administered 2022-11-25: 25 mL via INTRAVENOUS
  Filled 2022-11-25: qty 50

## 2022-11-25 MED ORDER — ORAL CARE MOUTH RINSE
15.0000 mL | OROMUCOSAL | Status: DC | PRN
Start: 2022-11-25 — End: 2022-11-27

## 2022-11-25 MED ORDER — NOREPINEPHRINE 4 MG/250ML-% IV SOLN
INTRAVENOUS | Status: AC
  Filled 2022-11-25: qty 250

## 2022-11-25 MED ORDER — CHLORHEXIDINE GLUCONATE CLOTH 2 % EX PADS
6.0000 | MEDICATED_PAD | Freq: Every day | CUTANEOUS | Status: DC
  Administered 2022-11-25 – 2022-11-28 (×5): 6 via TOPICAL

## 2022-11-25 MED ORDER — LACTATED RINGERS IV BOLUS
1000.0000 mL | Freq: Once | INTRAVENOUS | Status: AC
  Administered 2022-11-25: 1000 mL via INTRAVENOUS

## 2022-11-25 NOTE — Progress Notes (Signed)
Placed patient on BIPAP per CCM. Patient obtunded.

## 2022-11-25 NOTE — Progress Notes (Signed)
PHARMACY - ANTICOAGULATION CONSULT NOTE  Pharmacy Consult for heparin infusion Indication: atrial fibrillation  No Known Allergies  Patient Measurements: Weight: 59.2 kg (130 lb 8.2 oz) Heparin Dosing Weight: 59.2 kg  Vital Signs: Temp: 99 F (37.2 C) (11/24 1055) Temp Source: Axillary (11/24 0228) BP: 133/96 (11/24 1055) Pulse Rate: 84 (11/24 1055)  Labs: Recent Labs    11/24/22 1055 11/24/22 1123 11/24/22 1552 11/24/22 1553 11/24/22 1605 11/25/22 0119 11/25/22 0121 11/25/22 0254  HGB 15.5   < > 18.0* 18.4*  --   --  16.6 16.0  HCT 47.0   < > 53.0* 54.0*  --   --  52.8* 47.0  PLT 170  --   --   --   --   --  153  --   CREATININE  --   --  1.40*  --  1.51* 1.72*  --   --   TROPONINIHS  --   --   --   --  32* 42*  --   --    < > = values in this interval not displayed.    CrCl cannot be calculated (Unknown ideal weight.).   Medical History: Past Medical History:  Diagnosis Date   Acute on chronic respiratory failure with hypoxia (HCC)    Cardiac arrest [facility])    Cardiac arrest (HCC)    Chronic atrial fibrillation (HCC)    COVID-19 virus infection    Hypertension    Assessment: 89yoM admitted for encephalopathy presumed secondary to COVID pneumonia from nursing facility. Was on apixaban prior to admission, with last dose based on facility [facility] on 11/22 at 2100. CBC is currently WNL and stable. Pt does appear to have an AKI with uptrending Scr. Will follow aPTT levels with HL until confirmed correlating.  Goal of Therapy:  Heparin level 0.3-0.7 units/ml Monitor platelets by anticoagulation protocol: Yes   Plan:  Give 3000 units bolus x 1 Start heparin infusion at 900 units/hr Check anti-Xa level in 8 hours and daily while on heparin Continue to monitor H&H and platelets  Rutherford Nail, PharmD PGY2 Critical Care Pharmacy Resident 11/25/2022,11:17 AM

## 2022-11-25 NOTE — Progress Notes (Signed)
NAME:  Robert Carpenter, MRN:  [id number], DOB:  [birth year], LOS: 1 ADMISSION DATE:  11/24/2022, CONSULTATION DATE: 11/24 REFERRING MD: Dr. Lazarus Salines, CHIEF COMPLAINT: COVID-19 pneumonia, respiratory failure  History of Present Illness:  Patient is encephalopathic and/or intubated; therefore, history has been obtained from chart review.  89 year old male with past medical history as below, which is significant for chronic atrial fibrillation, cardiac arrest, hypertension, dysphagia, aspiration pneumonia and HFrEF.  At baseline he resides in skilled nursing facility and does have some baseline disorientation.  Confusion seems to be worse at night.  Overall course initially began to deteriorate a few years ago after severe COVID illness which required prolonged intubation and was complicated by cardiac arrest.  He underwent trach and PEG at that time both of which have been taken down.  More recently has been struggling with dysphagia and as result has not been eating well and has been losing weight.  He also struggles with recurrent pneumonia likely secondary to aspiration.  On 11/24 the patient was noted to have worsening encephalopathy complicated by agitation at the nursing facility.  Upon arrival to the emergency department he was fortunately conversant.  Laboratory evaluation was significant for hyperkalemia and respiratory acidosis.  Temporizing medications were given and the patient was placed on BiPAP.  Initial VBG did improve.  However, the patient struggled to keep BiPAP on and was growing increasingly agitated despite multiple sedative doses.  COVID PCR resulted as positive.  He was admitted to the hospital service and started on COVID treatment with dexamethasone and remdesivir.  Unfortunately he was unable to tolerate BiPAP due to agitation and ultimately became somnolent secondary to sedative medications plus acidosis.  PCCM was consulted for ICU transfer and possible intubation.  Pertinent   Medical History   has a past medical history of Acute on chronic respiratory failure with hypoxia (HCC), Cardiac arrest [facility]), Cardiac arrest (HCC), Chronic atrial fibrillation (HCC), COVID-19 virus infection, and Hypertension.   Significant Hospital Events: Including procedures, antibiotic start and stop dates in addition to other pertinent events   11/24 presented from SNF with altered mental status, COVID positive.  Hypercapnia seen on ABG.  Initially did not tolerate BiPAP due to agitation however reattempted this a.m. and currently compliant  Interim History / Subjective:  Minimally arousable  Objective   Blood pressure 124/65, pulse 100, temperature 98.4 F (36.9 C), resp. rate 17, weight 59.2 kg, SpO2 100%.    FiO2 (%):  [40 %] 40 %   Intake/Output Summary (Last 24 hours) at 11/25/2022 1191 Last data filed at 11/25/2022 4782 Gross per 24 hour  Intake 2900 ml  Output 1050 ml  Net 1850 ml   Filed Weights   11/25/22 0300  Weight: 59.2 kg    Examination: General: Acute on chronic ill-appearing severely deconditioned thin elderly male lying in bed in no acute distress HEENT: [place]/AT, MM pink/moist, PERRL,  Neuro: Responds to painful stimuli only CV: s1s2 regular rate and rhythm, no murmur, rubs, or gallops,  PULM: Clear to auscultation bilaterally, slightly diminished, no increased work of breathing, no added breath sounds GI: soft, bowel sounds active in all 4 quadrants, non-tender, non-distended Extremities: warm/dry, no edema  Skin: no rashes or lesions  Resolved Hospital Problem list     Assessment & Plan:   Acute respiratory failure with hypoxia and hypercarbia COVID-19 pneumonia.  -? Some pulmonary fibrosis from prior COVID P: Currently tolerating BiPAP, continue with plans to repeat ABG mid morning.  Low threshold to intubate  to protect airway given poor mentation  Aspiration precautions Continue dexamethasone and remdesivir Continue bronchodilators CAP  coverage  Acute metabolic encephalopathy -Driven by COVID 19, respiratory acidosis, and sedating medications. Was initially agitated, now very somnolent.  P: Management per neurology  Maintain neuro protective measures; goal for eurothermia, euglycemia, eunatermia, normoxia, and PCO2 goal of 35-40 Nutrition and bowel regiment  Seizure precautions  Aspirations precautions   Combined systolic and diastolic CHF -Most recent echocardiogram January 2023 with a EF of 40 to 45%, global hypokinesis, with grade 1 diastolic dysfunction Elevated troponin -Suspect demand related, flat trend Atrial flutter with RVR P: Continuous telemetry Hold home apixaban Strict intake and output  Daily weight to assess volume status Daily assessment for need to diurese Closely monitor renal function and electrolytes   AKI -Creatinine 1.51 with GFR 44 on admission compared to creatinine 0.96 with GFR greater than 60 January 2023 P: Follow renal function  Monitor urine output Trend Bmet Avoid nephrotoxins Ensure adequate renal perfusion   Lactic acidosis -Suspect related to work of breathing P: Nurse, mental health (right click and Programmer, systems" daily)   Diet/type: NPO DVT prophylaxis:   Pressure ulcer(s):  GI prophylaxis: PPI Lines: N/A Foley:  N/A Code Status:  full code Last date of multidisciplinary goals of care discussion: Family discussions held overnight, will update family on arrival vs over the phone   Critical care time:  CRITICAL CARE Performed by: Sherese Heyward D. Harris  Total critical care time: 38 minutes  Critical care time was exclusive of separately billable procedures and treating other patients.  Critical care was necessary to treat or prevent imminent or life-threatening deterioration.  Critical care was time spent personally by me on the following activities: development of treatment plan with patient and/or surrogate as well as nursing,  discussions with consultants, evaluation of patient's response to treatment, examination of patient, obtaining history from patient or surrogate, ordering and performing treatments and interventions, ordering and review of laboratory studies, ordering and review of radiographic studies, pulse oximetry and re-evaluation of patient's condition.  Declyn Offield D. Harris, NP-C  Pulmonary & Critical Care Personal contact information can be found on Amion  If no contact or response made please call [phone] 11/25/2022, 7:23 AM

## 2022-11-25 NOTE — Progress Notes (Signed)
eLink Physician-Brief Progress Note Patient Name: Robert Carpenter DOB: [birth year] MRN: [id number]   Date of Service  11/25/2022  HPI/Events of Note  Elevated lactic acid.  Blood pressure normal.  Not on pressors  eICU Interventions  Fluid bolus Follow lactate     Intervention Category Intermediate Interventions: Other:  Henry Russel, P 11/25/2022, 5:50 AM

## 2022-11-25 NOTE — Progress Notes (Addendum)
eLink Physician-Brief Progress Note Patient Name: Robert Carpenter DOB: 1933-11-28 MRN: [id number]   Date of Service  11/25/2022  HPI/Events of Note  Restless and agitated on BIPAP however doing well from a respiratory standpoint. O2 sat 99 on 35% fio2. No overt distress really but RN feels he is just not calm and won't keep the BIPAP on.   eICU Interventions  Try precedex. Hopeful to avoid intubation. HR 110. SBP 120. Not on pressors     Intervention Category Major Interventions: Respiratory failure - evaluation and management  Robert Carpenter 11/25/2022, 10:46 PM  Addendum at midnight - adding dextrose fluids due to low glucose levels.   Addendum at 1:50 am - Notified of VBG. Seen on camera. Precedex had to be turned off due to low Bps but even when calm, is not getting good VT (only in 140s). Not in distress and remains intermittently agitated but keeping the mask on. Will try AVAPS to see if that helps and repeat blood gas in a few hours. Overall remains tenuous.   Addendum at 450 am - Was notified by RT earlier that avaps not available so changes being made to pressures to see if that improves the ABG. Issues with drawing labs. Unable to stick, unable to do A stick either and has been a challenge with phlebotomy as well. RT unable to do an ABG either. Asked CCM team to check and see if an art line can be placed. Heparin is on hold.   Addendum at 6 am - CCM team was able to get na art line in. Patient now on AVAPS and doing better . ABG also better. Continue avaps.

## 2022-11-25 NOTE — Progress Notes (Signed)
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  Initially admitted by hospitalist service.  He was placed on Bipap but could not tolerate secondary to agitation.  Condition worsened and he was transferred to ICU for further eval and management.    eICU Interventions  Chart reviewed     Intervention Category Evaluation Type: New Patient Evaluation  Henry Russel, P 11/25/2022, 2:58 AM

## 2022-11-25 NOTE — ED Notes (Signed)
Pt not in restraints at this time.

## 2022-11-25 NOTE — Consult Note (Addendum)
NAME:  Robert Carpenter, MRN:  [id number], DOB:  01-Jun-1933, LOS: 1 ADMISSION DATE:  11/24/2022, CONSULTATION DATE: 11/24 REFERRING MD: Dr. Lazarus Salines, CHIEF COMPLAINT: COVID-19 pneumonia, respiratory failure  History of Present Illness:  Patient is encephalopathic and/or intubated; therefore, history has been obtained from chart review.  89 year old male with past medical history as below, which is significant for chronic atrial fibrillation, cardiac arrest, hypertension, dysphagia, aspiration pneumonia and HFrEF.  At baseline he resides in skilled nursing facility and does have some baseline disorientation.  Confusion seems to be worse at night.  Overall course initially began to deteriorate a few years ago after severe COVID illness which required prolonged intubation and was complicated by cardiac arrest.  He underwent trach and PEG at that time both of which have been taken down.  More recently has been struggling with dysphagia and as result has not been eating well and has been losing weight.  He also struggles with recurrent pneumonia likely secondary to aspiration.  On 11/24 the patient was noted to have worsening encephalopathy complicated by agitation at the nursing facility.  Upon arrival to the emergency department he was fortunately conversant.  Laboratory evaluation was significant for hyperkalemia and respiratory acidosis.  Temporizing medications were given and the patient was placed on BiPAP.  Initial VBG did improve.  However, the patient struggled to keep BiPAP on and was growing increasingly agitated despite multiple sedative doses.  COVID PCR resulted as positive.  He was admitted to the hospital service and started on COVID treatment with dexamethasone and remdesivir.  Unfortunately he was unable to tolerate BiPAP due to agitation and ultimately became somnolent secondary to sedative medications plus acidosis.  PCCM was consulted for ICU transfer and possible intubation.  Pertinent   Medical History   has a past medical history of Acute on chronic respiratory failure with hypoxia (HCC), Cardiac arrest [facility]), Cardiac arrest (HCC), Chronic atrial fibrillation (HCC), COVID-19 virus infection, and Hypertension.   Significant Hospital Events: Including procedures, antibiotic start and stop dates in addition to other pertinent events     Interim History / Subjective:    Objective   Blood pressure (!) 167/93, pulse (!) 36, temperature 98 F (36.7 C), temperature source Rectal, resp. rate (!) 27, SpO2 (!) 87%.    FiO2 (%):  [40 %] 40 %   Intake/Output Summary (Last 24 hours) at 11/25/2022 0036 Last data filed at 11/24/2022 2310 Gross per 24 hour  Intake 2900 ml  Output --  Net 2900 ml   There were no vitals filed for this visit.  Examination: General: Cachectic elderly male  HENT: [place]/AT, PERRL, No JJVD Lungs: Coarse bilateral breath sounds Cardiovascular: IRIR, tachy, no MRG Abdomen: Soft, NT, ND Extremities: No acute deformities Neuro: Lethargic, stirs to pain. No verbal response.    Resolved Hospital Problem list     Assessment & Plan:   Acute respiratory failure with hypoxia and hypercarbia COVID-19 pneumonia.  ? Some pulmonary fibrosis from prior COVID - Admit to ICU - Too somnolent now for BiPAP - Will repeat ABG when he arrives in ICU - May need to intubate - Continue dexamethasone, remdesivir - CAP coverage - Duonebs - Dr. Lazarus Salines had long GOC discussion with family indicating full code. Please see his H&P  Acute metabolic encephalopathy: driven by COVID 19, respiratory acidosis, and sedating medications. Was initially agitated, now very somnolent.  - Airway risk - Defer further sedating medications - Repeat ABG - May need intubation.  - Holding PO medications/NPO  Atrial flutter with RVR - Holding apixaban while NPO - Plan to start heparin in AM pending potential procedures.   AKI - hemodynamic support - Trend chemistry and urine  output.   Lactic acidosis: suspect related to work of breathing.  - Supportive care - Trend  Elevated troponin - Trend  Best Practice (right click and "Reselect all SmartList Selections" daily)   Diet/type: NPO DVT prophylaxis:  apixaban (ELIQUIS) tablet 5 mg   Pressure ulcer(s):  GI prophylaxis: PPI Lines: N/A Foley:  N/A Code Status:  full code Last date of multidisciplinary goals of care discussion []   Labs   CBC: Recent Labs  Lab 11/24/22 1055 11/24/22 1123 11/24/22 1552 11/24/22 1553  WBC 5.2  --   --   --   NEUTROABS 3.5  --   --   --   HGB 15.5 16.7 18.0* 18.4*  HCT 47.0 49.0 53.0* 54.0*  MCV 96.1  --   --   --   PLT 170  --   --   --     Basic Metabolic Panel: Recent Labs  Lab 11/24/22 1123 11/24/22 1552 11/24/22 1553 11/24/22 1605  NA 138 143 144 142  K 7.8* 4.1 4.1 4.1  CL  --  101  --  101  CO2  --   --   --  30  GLUCOSE  --  123*  --  125*  BUN  --  24*  --  17  CREATININE  --  1.40*  --  1.51*  CALCIUM  --   --   --  9.2   GFR: CrCl cannot be calculated (Unknown ideal weight.). Recent Labs  Lab 11/24/22 1055 11/24/22 2016 11/24/22 2310  WBC 5.2  --   --   LATICACIDVEN  --  4.2* 5.1*    Liver Function Tests: Recent Labs  Lab 11/24/22 1605  AST 25  ALT 30  ALKPHOS 39  BILITOT 1.2*  PROT 6.6  ALBUMIN 3.4*   No results for input(s): "LIPASE", "AMYLASE" in the last 168 hours. Recent Labs  Lab 11/24/22 2011  AMMONIA <10    ABG    Component Value Date/Time   PHART 7.393 01/14/2021 1600   PCO2ART 42.3 01/14/2021 1600   PO2ART 54.0 (L) 01/14/2021 1600   HCO3 35.4 (H) 11/24/2022 1553   TCO2 38 (H) 11/24/2022 1553   O2SAT 38 11/24/2022 1553     Coagulation Profile: No results for input(s): "INR", "PROTIME" in the last 168 hours.  Cardiac Enzymes: No results for input(s): "CKTOTAL", "CKMB", "CKMBINDEX", "TROPONINI" in the last 168 hours.  HbA1C: Hgb A1c MFr Bld  Date/Time Value Ref Range Status  03/10/2020 04:09 AM  5.8 (H) 4.8 - 5.6 % Final    Comment:    (NOTE) Pre diabetes:          5.7%-6.4%  Diabetes:              >6.4%  Glycemic control for   <7.0% adults with diabetes     CBG: Recent Labs  Lab 11/24/22 0944  GLUCAP 91    Review of Systems:   Patient is encephalopathic and/or intubated; therefore, history has been obtained from chart review.    Past Medical History:  He,  has a past medical history of Acute on chronic respiratory failure with hypoxia (HCC), Cardiac arrest [facility]), Cardiac arrest (HCC), Chronic atrial fibrillation (HCC), COVID-19 virus infection, and Hypertension.   Surgical History:   Past Surgical History:  Procedure Laterality Date  HIP SURGERY     IR REPLC GASTRO/COLONIC TUBE PERCUT W/FLUORO  03/16/2020   IR REPLC GASTRO/COLONIC TUBE PERCUT W/FLUORO  04/07/2020     Social History:   reports that he quit smoking about 54 years ago. His smoking use included cigarettes. He has never used smokeless tobacco. He reports that he does not currently use alcohol. He reports that he does not use drugs.   Family History:  His family history is not on file.   Allergies No Known Allergies   Home Medications  Prior to Admission medications   Medication Sig Start Date End Date Taking? Authorizing Provider  amantadine (SYMMETREL) 100 MG capsule Take 100 mg by mouth daily.   Yes [provider]  apixaban (ELIQUIS) 5 MG TABS tablet Take 5 mg by mouth 2 (two) times daily.   Yes [provider]  BREO ELLIPTA 200-25 MCG/ACT AEPB Inhale 1 puff into the lungs daily. 11/06/22  Yes [provider]  famotidine (PEPCID) 20 MG tablet Take 20 mg by mouth daily.   Yes [provider]  ipratropium-albuterol (DUONEB) 0.5-2.5 (3) MG/3ML SOLN Take 3 mLs by nebulization in the morning, at noon, and at bedtime. 11/06/22  Yes [provider]  losartan (COZAAR) 25 MG tablet Take 0.5 tablets (12.5 mg total) by mouth daily. 01/23/21  Yes Narda Bonds,  MD  mirtazapine (REMERON) 7.5 MG tablet Take 7.5 mg by mouth at bedtime.   Yes [provider]  Multiple Vitamin (MULTIVITAMIN WITH MINERALS) TABS tablet Take 1 tablet by mouth daily.   Yes [provider]  predniSONE (DELTASONE) 10 MG tablet Take 10 mg by mouth daily. 11/06/22  Yes [provider]  sennosides-docusate sodium (SENOKOT-S) 8.6-50 MG tablet Take 2 tablets by mouth in the morning and at bedtime.   Yes [provider]  sertraline (ZOLOFT) 50 MG tablet Take 25 mg by mouth daily.   Yes [provider]  traZODone (DESYREL) 50 MG tablet Take 50 mg by mouth at bedtime. 11/07/22  Yes [provider]  VENTOLIN HFA 108 (90 Base) MCG/ACT inhaler Inhale 1 puff into the lungs in the morning, at noon, and at bedtime. 08/24/22  Yes [provider]  acetaminophen (TYLENOL) 325 MG tablet Take 650 mg by mouth every 6 (six) hours as needed for headache, fever or mild pain. Patient not taking: Reported on 11/24/2022    [provider]  diclofenac Sodium (VOLTAREN) 1 % GEL Apply 2 g topically See admin instructions. Apply 2 grams to the left hip every 6 hours as needed for pain Patient not taking: Reported on 11/24/2022    [provider]  melatonin 3 MG TABS tablet Take 3 mg by mouth at bedtime. Patient not taking: Reported on 11/24/2022    [provider]  Omega-3 Fatty Acids (FISH OIL) 1000 MG CAPS Take 1,000 mg by mouth daily. Patient not taking: Reported on 11/24/2022    [provider]     Critical care time: 39 minutes     Joneen Roach, AGACNP-BC [place] [facility]  See Amion for personal pager PCCM on call pager [phone] until 7pm. Please call [place] 7p-7a. [phone]  11/25/2022 1:10 AM

## 2022-11-25 NOTE — Progress Notes (Signed)
PCCM progress note  Repeat blood gas mid afternoon with slightly improved pH but persistent hypercapnia.  Patient continues to respond to painful stimuli.  Attempted to contact family for update and discuss goals of care, no answer received at phone call.  Continue close observation for airway protection in ICU currently requiring BiPAP.  Repeat ABG pending.  Robert Ruan D. Harris, NP-C  Pulmonary & Critical Care Personal contact information can be found on Amion  If no contact or response made please call [phone] 11/25/2022, 4:10 PM

## 2022-11-25 NOTE — Plan of Care (Signed)
  Problem: Education: Goal: Knowledge of risk factors and measures for prevention of condition will improve Outcome: Progressing   Problem: Coping: Goal: Psychosocial and spiritual needs will be supported Outcome: Progressing   Problem: Respiratory: Goal: Will maintain a patent airway Outcome: Progressing Goal: Complications related to the disease process, condition or treatment will be avoided or minimized Outcome: Progressing   Problem: Education: Goal: Knowledge of General Education information will improve Description: Including pain rating scale, medication(s)/side effects and non-pharmacologic comfort measures Outcome: Progressing   Problem: Health Behavior/Discharge Planning: Goal: Ability to manage health-related needs will improve Outcome: Progressing   Problem: Clinical Measurements: Goal: Ability to maintain clinical measurements within normal limits will improve Outcome: Progressing Goal: Will remain free from infection Outcome: Progressing Goal: Diagnostic test results will improve Outcome: Progressing Goal: Respiratory complications will improve Outcome: Progressing Goal: Cardiovascular complication will be avoided Outcome: Progressing   Problem: Activity: Goal: Risk for activity intolerance will decrease Outcome: Progressing   Problem: Nutrition: Goal: Adequate nutrition will be maintained Outcome: Progressing   Problem: Coping: Goal: Level of anxiety will decrease Outcome: Progressing   Problem: Elimination: Goal: Will not experience complications related to bowel motility Outcome: Progressing Goal: Will not experience complications related to urinary retention Outcome: Progressing   Problem: Pain Management: Goal: General experience of comfort will improve Outcome: Progressing   Problem: Safety: Goal: Ability to remain free from injury will improve Outcome: Progressing   Problem: Skin Integrity: Goal: Risk for impaired skin integrity will  decrease Outcome: Progressing   Problem: Education: Goal: Ability to describe self-care measures that may prevent or decrease complications (Diabetes Survival Skills Education) will improve Outcome: Progressing Goal: Individualized Educational Video(s) Outcome: Progressing   Problem: Coping: Goal: Ability to adjust to condition or change in health will improve Outcome: Progressing   Problem: Fluid Volume: Goal: Ability to maintain a balanced intake and output will improve Outcome: Progressing   Problem: Health Behavior/Discharge Planning: Goal: Ability to identify and utilize available resources and services will improve Outcome: Progressing Goal: Ability to manage health-related needs will improve Outcome: Progressing   Problem: Metabolic: Goal: Ability to maintain appropriate glucose levels will improve Outcome: Progressing   Problem: Nutritional: Goal: Maintenance of adequate nutrition will improve Outcome: Progressing Goal: Progress toward achieving an optimal weight will improve Outcome: Progressing   Problem: Skin Integrity: Goal: Risk for impaired skin integrity will decrease Outcome: Progressing   Problem: Tissue Perfusion: Goal: Adequacy of tissue perfusion will improve Outcome: Progressing

## 2022-11-26 ENCOUNTER — Inpatient Hospital Stay (HOSPITAL_COMMUNITY): Payer: Medicare Other

## 2022-11-26 DIAGNOSIS — E43 Unspecified severe protein-calorie malnutrition: Secondary | ICD-10-CM | POA: Insufficient documentation

## 2022-11-26 DIAGNOSIS — J1282 Pneumonia due to coronavirus disease 2019: Secondary | ICD-10-CM | POA: Diagnosis not present

## 2022-11-26 DIAGNOSIS — G9341 Metabolic encephalopathy: Secondary | ICD-10-CM

## 2022-11-26 DIAGNOSIS — U071 COVID-19: Secondary | ICD-10-CM | POA: Diagnosis not present

## 2022-11-26 DIAGNOSIS — J9602 Acute respiratory failure with hypercapnia: Secondary | ICD-10-CM | POA: Diagnosis not present

## 2022-11-26 DIAGNOSIS — J9601 Acute respiratory failure with hypoxia: Secondary | ICD-10-CM | POA: Diagnosis not present

## 2022-11-26 LAB — POCT I-STAT 7, (LYTES, BLD GAS, ICA,H+H)
Acid-Base Excess: 5 mmol/L — ABNORMAL HIGH (ref 0.0–2.0)
Acid-Base Excess: 7 mmol/L — ABNORMAL HIGH (ref 0.0–2.0)
Acid-Base Excess: 5 mmol/L — ABNORMAL HIGH (ref 0.0–2.0)
Acid-Base Excess: 7 mmol/L — ABNORMAL HIGH (ref 0.0–2.0)
Bicarbonate: 34.1 mmol/L — ABNORMAL HIGH (ref 20.0–28.0)
Bicarbonate: 36.7 mmol/L — ABNORMAL HIGH (ref 20.0–28.0)
Bicarbonate: 32.6 mmol/L — ABNORMAL HIGH (ref 20.0–28.0)
Bicarbonate: 33.7 mmol/L — ABNORMAL HIGH (ref 20.0–28.0)
Calcium, Ion: 1.27 mmol/L (ref 1.15–1.40)
Calcium, Ion: 1.24 mmol/L (ref 1.15–1.40)
Calcium, Ion: 1.24 mmol/L (ref 1.15–1.40)
Calcium, Ion: 1.23 mmol/L (ref 1.15–1.40)
HCT: 39 % (ref 39.0–52.0)
HCT: 38 % — ABNORMAL LOW (ref 39.0–52.0)
HCT: 35 % — ABNORMAL LOW (ref 39.0–52.0)
HCT: 37 % — ABNORMAL LOW (ref 39.0–52.0)
Hemoglobin: 13.3 g/dL (ref 13.0–17.0)
Hemoglobin: 12.9 g/dL — ABNORMAL LOW (ref 13.0–17.0)
Hemoglobin: 11.9 g/dL — ABNORMAL LOW (ref 13.0–17.0)
Hemoglobin: 12.6 g/dL — ABNORMAL LOW (ref 13.0–17.0)
O2 Saturation: 99 %
O2 Saturation: 99 %
O2 Saturation: 99 %
O2 Saturation: 98 %
Patient temperature: 36
Patient temperature: 35.2
Patient temperature: 35.4
Patient temperature: 37.9
Potassium: 3.9 mmol/L (ref 3.5–5.1)
Potassium: 3.8 mmol/L (ref 3.5–5.1)
Potassium: 3.8 mmol/L (ref 3.5–5.1)
Potassium: 4.1 mmol/L (ref 3.5–5.1)
Sodium: 145 mmol/L (ref 135–145)
Sodium: 144 mmol/L (ref 135–145)
Sodium: 144 mmol/L (ref 135–145)
Sodium: 144 mmol/L (ref 135–145)
TCO2: 36 mmol/L — ABNORMAL HIGH (ref 22–32)
TCO2: 39 mmol/L — ABNORMAL HIGH (ref 22–32)
TCO2: 34 mmol/L — ABNORMAL HIGH (ref 22–32)
TCO2: 35 mmol/L — ABNORMAL HIGH (ref 22–32)
pCO2 arterial: 65.9 mm[Hg] (ref 32–48)
pCO2 arterial: 72.2 mm[Hg] (ref 32–48)
pCO2 arterial: 56 mm[Hg] — ABNORMAL HIGH (ref 32–48)
pCO2 arterial: 59.1 mm[Hg] — ABNORMAL HIGH (ref 32–48)
pH, Arterial: 7.316 — ABNORMAL LOW (ref 7.35–7.45)
pH, Arterial: 7.305 — ABNORMAL LOW (ref 7.35–7.45)
pH, Arterial: 7.366 (ref 7.35–7.45)
pH, Arterial: 7.368 (ref 7.35–7.45)
pO2, Arterial: 154 mm[Hg] — ABNORMAL HIGH (ref 83–108)
pO2, Arterial: 171 mm[Hg] — ABNORMAL HIGH (ref 83–108)
pO2, Arterial: 121 mm[Hg] — ABNORMAL HIGH (ref 83–108)
pO2, Arterial: 116 mm[Hg] — ABNORMAL HIGH (ref 83–108)

## 2022-11-26 LAB — CBC
HCT: 35.7 % — ABNORMAL LOW (ref 39.0–52.0)
Hemoglobin: 11.5 g/dL — ABNORMAL LOW (ref 13.0–17.0)
MCH: 31.3 pg (ref 26.0–34.0)
MCHC: 32.2 g/dL (ref 30.0–36.0)
MCV: 97 fL (ref 80.0–100.0)
Platelets: 122 10*3/uL — ABNORMAL LOW (ref 150–400)
RBC: 3.68 MIL/uL — ABNORMAL LOW (ref 4.22–5.81)
RDW: 15.7 % — ABNORMAL HIGH (ref 11.5–15.5)
WBC: 11.3 10*3/uL — ABNORMAL HIGH (ref 4.0–10.5)
nRBC: 0 % (ref 0.0–0.2)

## 2022-11-26 LAB — BASIC METABOLIC PANEL
Anion gap: 6 (ref 5–15)
BUN: 24 mg/dL — ABNORMAL HIGH (ref 8–23)
CO2: 28 mmol/L (ref 22–32)
Calcium: 7.8 mg/dL — ABNORMAL LOW (ref 8.9–10.3)
Chloride: 105 mmol/L (ref 98–111)
Creatinine, Ser: 1.41 mg/dL — ABNORMAL HIGH (ref 0.61–1.24)
GFR, Estimated: 48 mL/min — ABNORMAL LOW (ref >60)
Glucose, Bld: 520 mg/dL (ref 70–99)
Potassium: 4.3 mmol/L (ref 3.5–5.1)
Sodium: 139 mmol/L (ref 135–145)

## 2022-11-26 LAB — LACTIC ACID, PLASMA: Lactic Acid, Venous: 1.2 mmol/L (ref 0.5–1.9)

## 2022-11-26 LAB — HEPARIN LEVEL (UNFRACTIONATED)
Heparin Unfractionated: >1.1 [IU]/mL — ABNORMAL HIGH (ref 0.30–0.70)
Heparin Unfractionated: 0.81 [IU]/mL — ABNORMAL HIGH (ref 0.30–0.70)

## 2022-11-26 LAB — GLUCOSE, CAPILLARY
Glucose-Capillary: 113 mg/dL — ABNORMAL HIGH (ref 70–99)
Glucose-Capillary: 118 mg/dL — ABNORMAL HIGH (ref 70–99)
Glucose-Capillary: 126 mg/dL — ABNORMAL HIGH (ref 70–99)
Glucose-Capillary: 165 mg/dL — ABNORMAL HIGH (ref 70–99)
Glucose-Capillary: 206 mg/dL — ABNORMAL HIGH (ref 70–99)
Glucose-Capillary: 234 mg/dL — ABNORMAL HIGH (ref 70–99)
Glucose-Capillary: 91 mg/dL (ref 70–99)
Glucose-Capillary: 99 mg/dL (ref 70–99)

## 2022-11-26 LAB — POCT I-STAT EG7
Acid-Base Excess: 5 mmol/L — ABNORMAL HIGH (ref 0.0–2.0)
Bicarbonate: 34.7 mmol/L — ABNORMAL HIGH (ref 20.0–28.0)
Calcium, Ion: 1.23 mmol/L (ref 1.15–1.40)
HCT: 37 % — ABNORMAL LOW (ref 39.0–52.0)
Hemoglobin: 12.6 g/dL — ABNORMAL LOW (ref 13.0–17.0)
O2 Saturation: 39 %
Patient temperature: 99
Potassium: 4.4 mmol/L (ref 3.5–5.1)
Sodium: 144 mmol/L (ref 135–145)
TCO2: 37 mmol/L — ABNORMAL HIGH (ref 22–32)
pCO2, Ven: 80.6 mm[Hg] (ref 44–60)
pH, Ven: 7.243 — ABNORMAL LOW (ref 7.25–7.43)
pO2, Ven: 28 mm[Hg] — CL (ref 32–45)

## 2022-11-26 LAB — APTT
aPTT: >200 s (ref 24–36)
aPTT: 155 s — ABNORMAL HIGH (ref 24–36)

## 2022-11-26 LAB — BRAIN NATRIURETIC PEPTIDE: B Natriuretic Peptide: 378.7 pg/mL — ABNORMAL HIGH (ref 0.0–100.0)

## 2022-11-26 MED ORDER — SODIUM CHLORIDE 0.9 % IV BOLUS
500.0000 mL | Freq: Once | INTRAVENOUS | Status: AC
  Administered 2022-11-26: 500 mL via INTRAVENOUS

## 2022-11-26 MED ORDER — SODIUM CHLORIDE 0.9 % IV SOLN: INTRAVENOUS | Status: DC

## 2022-11-26 MED ORDER — THIAMINE MONONITRATE 100 MG PO TABS: 100.0000 mg | ORAL_TABLET | Freq: Every day | ORAL | Status: DC

## 2022-11-26 MED ORDER — NOREPINEPHRINE 4 MG/250ML-% IV SOLN
0.0000 ug/min | INTRAVENOUS | Status: DC
  Administered 2022-11-28: 2 ug/min via INTRAVENOUS
  Filled 2022-11-26 (×2): qty 250

## 2022-11-26 MED ORDER — HEPARIN (PORCINE) 25000 UT/250ML-% IV SOLN
800.0000 [IU]/h | INTRAVENOUS | Status: DC
  Administered 2022-11-26: 800 [IU]/h via INTRAVENOUS
  Filled 2022-11-26: qty 250

## 2022-11-26 MED ORDER — NOREPINEPHRINE 4 MG/250ML-% IV SOLN
INTRAVENOUS | Status: AC
  Administered 2022-11-26: 2 ug/min via INTRAVENOUS
  Filled 2022-11-26: qty 250

## 2022-11-26 MED ORDER — VITAL 1.5 CAL PO LIQD: 1000.0000 mL | ORAL | Status: DC

## 2022-11-26 MED ORDER — HEPARIN (PORCINE) 25000 UT/250ML-% IV SOLN
850.0000 [IU]/h | INTRAVENOUS | Status: DC
  Administered 2022-11-26 – 2022-11-28 (×2): 700 [IU]/h via INTRAVENOUS
  Administered 2022-11-29: 800 [IU]/h via INTRAVENOUS
  Filled 2022-11-26 (×2): qty 250

## 2022-11-26 NOTE — Progress Notes (Signed)
SLP Cancellation Note  Patient Details Name: Robert Carpenter MRN: [id number] DOB: March 09, 1933   Cancelled treatment:       Reason Eval/Treat Not Completed: Patient not medically ready. Per RN, pt not able to come off BiPAP yet. SLP will f/u for swallow eval when ready.     Mahala Menghini., M.A. CCC-SLP Acute Rehabilitation Services Office [phone]  Secure chat preferred  11/26/2022, 8:47 AM

## 2022-11-26 NOTE — Progress Notes (Signed)
PT Cancellation Note  Patient Details Name: Robert Carpenter MRN: [id number] DOB: 12-Jul-1933   Cancelled Treatment:    Reason Eval/Treat Not Completed: Patient not medically ready  Discussed case with RN this morning, recommends hold formal PT evaluation at this time; still on BIPAP. Will continue to follow. Attempt again tomorrow.   Kathlyn [place], PT, DPT [facility]  Rehabilitation Services Physical Therapist Office: [phone] Website: [email or web address]   [place]erton Mount 11/26/2022, 10:45 AM

## 2022-11-26 NOTE — Progress Notes (Signed)
Changed Bipap mode from AVAPS to regular S/T mode due to patient Vts dropping below 200 on AVAPS mode.

## 2022-11-26 NOTE — Plan of Care (Signed)
  Problem: Education: Goal: Knowledge of risk factors and measures for prevention of condition will improve Outcome: Progressing   Problem: Coping: Goal: Psychosocial and spiritual needs will be supported Outcome: Progressing   Problem: Respiratory: Goal: Will maintain a patent airway Outcome: Progressing Goal: Complications related to the disease process, condition or treatment will be avoided or minimized Outcome: Progressing   Problem: Education: Goal: Knowledge of General Education information will improve Description: Including pain rating scale, medication(s)/side effects and non-pharmacologic comfort measures Outcome: Progressing   Problem: Health Behavior/Discharge Planning: Goal: Ability to manage health-related needs will improve Outcome: Progressing   Problem: Clinical Measurements: Goal: Ability to maintain clinical measurements within normal limits will improve Outcome: Progressing Goal: Will remain free from infection Outcome: Progressing Goal: Diagnostic test results will improve Outcome: Progressing Goal: Respiratory complications will improve Outcome: Progressing Goal: Cardiovascular complication will be avoided Outcome: Progressing   Problem: Activity: Goal: Risk for activity intolerance will decrease Outcome: Progressing   Problem: Nutrition: Goal: Adequate nutrition will be maintained Outcome: Progressing   Problem: Coping: Goal: Level of anxiety will decrease Outcome: Progressing   Problem: Elimination: Goal: Will not experience complications related to bowel motility Outcome: Progressing Goal: Will not experience complications related to urinary retention Outcome: Progressing   Problem: Pain Management: Goal: General experience of comfort will improve Outcome: Progressing   Problem: Safety: Goal: Ability to remain free from injury will improve Outcome: Progressing   Problem: Skin Integrity: Goal: Risk for impaired skin integrity will  decrease Outcome: Progressing   Problem: Education: Goal: Ability to describe self-care measures that may prevent or decrease complications (Diabetes Survival Skills Education) will improve Outcome: Progressing Goal: Individualized Educational Video(s) Outcome: Progressing   Problem: Coping: Goal: Ability to adjust to condition or change in health will improve Outcome: Progressing   Problem: Fluid Volume: Goal: Ability to maintain a balanced intake and output will improve Outcome: Progressing   Problem: Health Behavior/Discharge Planning: Goal: Ability to identify and utilize available resources and services will improve Outcome: Progressing Goal: Ability to manage health-related needs will improve Outcome: Progressing   Problem: Metabolic: Goal: Ability to maintain appropriate glucose levels will improve Outcome: Progressing   Problem: Nutritional: Goal: Maintenance of adequate nutrition will improve Outcome: Progressing Goal: Progress toward achieving an optimal weight will improve Outcome: Progressing   Problem: Skin Integrity: Goal: Risk for impaired skin integrity will decrease Outcome: Progressing   Problem: Tissue Perfusion: Goal: Adequacy of tissue perfusion will improve Outcome: Progressing

## 2022-11-26 NOTE — Progress Notes (Signed)
Critical ABG results given to Dr Denese Killings at 1244 pH 7.30 pCO2 72.2 pO2-171 HCO3 36.7

## 2022-11-26 NOTE — Progress Notes (Signed)
PHARMACY - ANTICOAGULATION CONSULT NOTE  Pharmacy Consult for heparin infusion Indication: atrial fibrillation  No Known Allergies  Patient Measurements: Height: 6\' 2"  (188 cm) Weight: 59.2 kg (130 lb 8.2 oz) IBW/kg (Calculated) : 82.2 Heparin Dosing Weight: 59.2 kg  Vital Signs: Temp: 96.8 F (36 C) (11/25 0300) Temp Source: Bladder (11/25 0000) BP: 81/56 (11/25 0300) Pulse Rate: 90 (11/25 0300)  Labs: Recent Labs    11/24/22 1055 11/24/22 1123 11/24/22 1605 11/25/22 0119 11/25/22 0121 11/25/22 0254 11/25/22 1723 11/26/22 0123 11/26/22 0130  HGB 15.5   < >  --   --  16.6   < > 13.9 11.5* 12.6*  HCT 47.0   < >  --   --  52.8*   < > 41.0 35.7* 37.0*  PLT 170  --   --   --  153  --   --  122*  --   APTT  --   --   --   --   --   --   --  >200*  --   HEPARINUNFRC  --   --   --   --   --   --   --  >1.10*  --   CREATININE  --    < > 1.51* 1.72*  --   --   --  1.41*  --   TROPONINIHS  --   --  32* 42*  --   --   --   --   --    < > = values in this interval not displayed.    Estimated Creatinine Clearance: 30.9 mL/min (A) (by C-G formula based on SCr of 1.41 mg/dL (H)).  Assessment: 89yoM admitted for encephalopathy presumed secondary to COVID pneumonia from nursing facility. Was on apixaban prior to admission, with last dose based on facility [facility] on 11/22 at 2100. CBC is currently WNL and stable. Pt does appear to have an AKI with uptrending Scr. Will follow aPTT levels with HL until confirmed correlating.  11/25 AM: heparin level returned at >1.1 and aPTT >200 seconds. Per discussion with RN, heparin stopped for 10 minutes, line flushed, and 10 cc wasted. Based on this information and not having ability to stick patient at this time, will dose based on levels. No signs/symptoms of bleeding.  Goal of Therapy:  Heparin level 0.3-0.7 units/ml aPTT 66-102 seconds  Monitor platelets by anticoagulation protocol: Yes   Plan:  Hold heparin for 1 hour Decrease heparin  infusion to 800 units/hr Check anti-Xa and APTT level in 8 hours and daily while on heparin Continue to monitor H&H and platelets  Arabella Merles, PharmD. Clinical Pharmacist 11/26/2022 3:26 AM

## 2022-11-26 NOTE — Progress Notes (Signed)
Cortrak Tube Team Note:  Consult received to place a Cortrak feeding tube.   Two cortrak team members attempted to place tube >30 minutes with RN and RRT assistance without success. Tube would not advance past the GE junction. MD was notified. Cortrak tube left at bedside and advised MD tube could be reattempted on Wednesday or consult to fluoroscopy could be placed.     Greig Castilla, RD, LDN Registered Dietitian II RD pager # available in [facility]ION  After hours/weekend pager # available in [facility]

## 2022-11-26 NOTE — Progress Notes (Signed)
IV consult received to place IV for lab draws. Primary RN notified that pt will need a PICC or central line for lab draws.

## 2022-11-26 NOTE — Procedures (Signed)
Arterial Catheter Insertion Procedure Note  Robert Carpenter  [id number]  09-13-1933  Date:11/26/22  Time:5:36 AM    Provider Performing: Robert Carpenter    Procedure: Insertion of Arterial Line (40981) with US guidance (19147)   Indication(s) Blood pressure monitoring and/or need for frequent ABGs  Consent Unable to obtain consent due to emergent nature of procedure.  Anesthesia None   Time Out Verified patient identification, verified procedure, site/side was marked, verified correct patient position, special equipment/implants available, medications/allergies/relevant history reviewed, required imaging and test results available.   Sterile Technique Maximal sterile technique including full sterile barrier drape, hand hygiene, sterile gown, sterile gloves, mask, hair covering, sterile ultrasound probe cover (if used).   Procedure Description Area of catheter insertion was cleaned with chlorhexidine and draped in sterile fashion. With real-time ultrasound guidance an arterial catheter was placed into the left radial artery.  Appropriate arterial tracings confirmed on monitor.     Complications/Tolerance None; patient tolerated the procedure well.   EBL Minimal   Specimen(s) None   Robert Carpenter Robert Lavoy, PA-C

## 2022-11-26 NOTE — Progress Notes (Signed)
Initial Nutrition Assessment  DOCUMENTATION CODES:   Underweight, Severe malnutrition in context of chronic illness  INTERVENTION:   Once Cortrak placement confirmed with abdominal x-ray, initiate enteral nutrition: - Start Vital 1.5 @ 20 ml/hr and advance rate by 10 ml every 8 hours to goal rate of 50 ml/hr (1200 ml/day)  Tube feeding regimen provides 1800 kcal, 81 grams of protein, and 917 ml of H2O.   Monitor magnesium, potassium, and phosphorus every 12 hours for at least 6 occurrences. MD to replete as needed as pt is at risk for refeeding syndrome given severe malnutrition.  - Thiamine 100 mg daily per tube x 5 days due to refeeding risk  NUTRITION DIAGNOSIS:   Severe Malnutrition related to chronic illness (chronic dysphagia, CHF, interstitial lung disease) as evidenced by severe fat depletion, severe muscle depletion.  GOAL:   Patient will meet greater than or equal to 90% of their needs  MONITOR:   Diet advancement, Labs, Weight trends, TF tolerance  REASON FOR ASSESSMENT:   Consult Enteral/tube feeding initiation and management  ASSESSMENT:   89 year old male who presented to the ED from SNF on 11/23 with AMS. PMH of atrial fibrillation, HTN, DVT, CHF, interstitial lung disease, dysphagia, aspiration pneumonia, progressive cognitive decline and suspected undiagnosed dementia, prior cardiac arrest, prolonged intubation requiring trach and PEG which have now been d/c. Pt admitted with acute respiratory failure with hypoxia and hypercarbia due to COVID-19 pneumonia requiring BiPAP.  11/25 - Cortrak placement planned  Discussed pt with RN and during ICU rounds. Plan for Cortrak placement today. Consult received for enteral nutrition initiation and management.  Unable to obtain diet and weight history at this time. Per chart review, pt has chronic dysphagia and consumes pureed foods at baseline. Pt does not have dentures. Per notes, pt sometimes does not want to eat and  has recently had decreased oral intake and has been losing weight. Pt with a history of recurrent aspiration.  Reviewed weight history in chart. Pt with a 19.4 kg weight loss since 07/18/21. This is a 25% weight loss in 16 months. Based on NFPE, pt meets criteria for severe malnutrition and is at high risk for refeeding. MD okay with adding thiamine x 5 days for refeeding risk.  Medications reviewed and include: IV decadron, IV abx, precedex gtt, heparin gtt, remdesivir IVF: D5 in LR @ 125 ml/hr to be d/c at 1144 today  Labs reviewed: BUN 24, creatinine 1.41, WBC 11.3, platelets 122 CBG's: 50-234 x 24 hours  UOP: 1675 ml x 24 hours I/O's: +4.0 L since admit  NUTRITION - FOCUSED PHYSICAL EXAM:  Flowsheet Row Most Recent Value  Orbital Region Severe depletion  Upper Arm Region Severe depletion  Thoracic and Lumbar Region Severe depletion  Buccal Region Unable to assess  [BiPAP in place]  Temple Region Moderate depletion  Clavicle Bone Region Severe depletion  Clavicle and Acromion Bone Region Severe depletion  Scapular Bone Region Severe depletion  Dorsal Hand Severe depletion  Patellar Region Severe depletion  Anterior Thigh Region Severe depletion  Posterior Calf Region Severe depletion  Edema (RD Assessment) None  Hair Reviewed  Eyes Reviewed  Mouth Reviewed  Skin Reviewed  Nails Reviewed    Diet Order:   Diet Order             Diet NPO time specified Except for: Sips with Meds  Diet effective now                   EDUCATION  NEEDS:   Not appropriate for education at this time  Skin:  Skin Assessment: Reviewed RN Assessment  Last BM:  no documented BM  Height:   Ht Readings from Last 1 Encounters:  11/25/22 6\' 2"  (1.88 m)    Weight:   Wt Readings from Last 1 Encounters:  11/25/22 59.2 kg    BMI:  Body mass index is 16.76 kg/m.  Estimated Nutritional Needs:   Kcal:  1800-2000  Protein:  80-95 grams  Fluid:  >1.8 L    Mertie Clause, MS, RD,  LDN Registered Dietitian II Please see AMiON for contact information.

## 2022-11-26 NOTE — Progress Notes (Signed)
NAME:  Robert Carpenter, MRN:  [id number], DOB:  1933-05-17, LOS: 2 ADMISSION DATE:  11/24/2022, CONSULTATION DATE: 11/24 REFERRING MD: Dr. Lazarus Salines, CHIEF COMPLAINT: COVID-19 pneumonia, respiratory failure  History of Present Illness:  Patient is encephalopathic and/or intubated; therefore, history has been obtained from chart review.  89 year old male with past medical history as below, which is significant for chronic atrial fibrillation, cardiac arrest, hypertension, dysphagia, aspiration pneumonia and HFrEF.  At baseline he resides in skilled nursing facility and does have some baseline disorientation.  Confusion seems to be worse at night.  Overall course initially began to deteriorate a few years ago after severe COVID illness which required prolonged intubation and was complicated by cardiac arrest.  He underwent trach and PEG at that time both of which have been taken down.  More recently has been struggling with dysphagia and as result has not been eating well and has been losing weight.  He also struggles with recurrent pneumonia likely secondary to aspiration.  On 11/24 the patient was noted to have worsening encephalopathy complicated by agitation at the nursing facility.  Upon arrival to the emergency department he was fortunately conversant.  Laboratory evaluation was significant for hyperkalemia and respiratory acidosis.  Temporizing medications were given and the patient was placed on BiPAP.  Initial VBG did improve.  However, the patient struggled to keep BiPAP on and was growing increasingly agitated despite multiple sedative doses.  COVID PCR resulted as positive.  He was admitted to the hospital service and started on COVID treatment with dexamethasone and remdesivir.  Unfortunately he was unable to tolerate BiPAP due to agitation and ultimately became somnolent secondary to sedative medications plus acidosis.  PCCM was consulted for ICU transfer and possible intubation.  Pertinent   Medical History   Past Medical History:  Diagnosis Date   Acute on chronic respiratory failure with hypoxia (HCC)    Cardiac arrest [facility])    Cardiac arrest (HCC)    Chronic atrial fibrillation (HCC)    COVID-19 virus infection    Hypertension     Significant Hospital Events: Including procedures, antibiotic start and stop dates in addition to other pertinent events   11/24 presented from SNF with altered mental status, COVID positive.  Hypercapnia seen on ABG.  Initially did not tolerate BiPAP due to agitation however reattempted this a.m. and currently compliant 11/24 Doing better on NIV since switched to AVAPS  Interim History / Subjective:  Remains on NIV which he is tolerating since starting Precedex.   Objective   Blood pressure 100/79, pulse 90, temperature (!) 96.3 F (35.7 C), resp. rate 14, height 6\' 2"  (1.88 m), weight 59.2 kg, SpO2 99%.    Vent Mode: PCV;BIPAP FiO2 (%):  [30 %-40 %] 35 % Set Rate:  [18 bmp-28 bmp] 28 bmp Vt Set:  [28 mL] 28 mL PEEP:  [5 cmH20] 5 cmH20   Intake/Output Summary (Last 24 hours) at 11/26/2022 1009 Last data filed at 11/26/2022 0800 Gross per 24 hour  Intake 2547.08 ml  Output 1175 ml  Net 1372.08 ml   Filed Weights   11/25/22 0300 11/25/22 1100  Weight: 59.2 kg 59.2 kg    Examination: General: Acute on chronic ill-appearing severely deconditioned thin elderly male lying in bed in no acute distress HEENT: Reasonable mask seal. Neuro: Arouses to verbal stimulation but not purposefully.  Moves all limbs. CV: Heart sounds are normal.  Extremities are warm well-perfused.  In rate controlled atrial flutter.  Normotensive on no vasopressors. PULM:  Clear to auscultation bilaterally, slightly diminished, no increased work of breathing, no added breath sounds.  Generating minute ventilation of 8 L on AVAPS. GI: soft, nontender. GU: Foley catheter in place with dark-colored urine. Extremities: warm/dry, no edema  Skin: no rashes or  lesions  Ancillary test personally reviewed:  pCO2 has leveled off at 65.  pH now normal. Normal baseline CO2.  Presently 28. Creatinine has improved to 1.41 Improving leukocytosis 11.3 Assessment & Plan:   COVID-19 pneumonia causing acute respiratory failure with hypoxia and hypercarbia and acute metabolic encephalopathy Combined systolic and diastolic CHF  Atrial flutter with RVR Demand-related ischemia. AKI with lactic acidosis.  Improving Severe malnutrition due to longstanding dysphagia.  Plan:  -Continue noninvasive ventilation using AVAPS mode.  Follow ABG via arterial line.  Given comorbidities he is a poor intubation candidate.  Would likely require prolonged mechanical ventilation with significant impairment in quality of life. -Continue to titrate low-dose Precedex for comfort.  Once enteral access established may be able to start other agents. -Continue dexamethasone and remdesivir for COVID pneumonia. -Cannot rule out superimposed community-acquired pneumonia.  Complete 5-day course of ceftriaxone and azithromycin. -Continue heparin for atrial flutter.  Remains rate controlled. -Maintain euvolemia.  Cut back IV fluids. -Place smallbore feeding tube and start enteral nutrition.  Best Practice (right click and "Reselect all SmartList Selections" daily)   Diet/type: NPO to start tube feeds. DVT prophylaxis:   Pressure ulcer(s):  GI prophylaxis: PPI Lines: N/A Foley: In place and is still required. Code Status:  full code Last date of multidisciplinary goals of care discussion: Family discussions held overnight, will update family on arrival vs over the phone   Critical care time:   CRITICAL CARE Performed by: Lynnell Catalan  Total critical care time: 35 minutes  Critical care time was exclusive of separately billable procedures and treating other patients.  Critical care was necessary to treat or prevent imminent or life-threatening deterioration.  Critical  care was time spent personally by me on the following activities: development of treatment plan with patient and/or surrogate as well as nursing, discussions with consultants, evaluation of patient's response to treatment, examination of patient, obtaining history from patient or surrogate, ordering and performing treatments and interventions, ordering and review of laboratory studies, ordering and review of radiographic studies, pulse oximetry and re-evaluation of patient's condition.  Lynnell Catalan, MD [facility] ICU Physician [facility]  Pager: [phone] Or Epic Secure Chat After hours: [phone].  11/26/2022, 10:12 AM

## 2022-11-26 NOTE — Progress Notes (Addendum)
PHARMACY - ANTICOAGULATION CONSULT NOTE  Pharmacy Consult for heparin infusion Indication: atrial fibrillation  No Known Allergies  Patient Measurements: Height: 6\' 2"  (188 cm) Weight: 59.2 kg (130 lb 8.2 oz) IBW/kg (Calculated) : 82.2 Heparin Dosing Weight: 59.2 kg  Vital Signs: Temp: 98.8 F (37.1 C) (11/25 1600) Temp Source: Bladder (11/25 1522) BP: 81/57 (11/25 1200) Pulse Rate: 116 (11/25 1600)  Labs: Recent Labs    11/24/22 1055 11/24/22 1123 11/24/22 1605 11/25/22 0119 11/25/22 0121 11/25/22 0254 11/26/22 0123 11/26/22 0130 11/26/22 0540 11/26/22 1237 11/26/22 1336 11/26/22 1427  HGB 15.5   < >  --   --  16.6   < > 11.5*   < > 13.3 12.9* 11.9*  --   HCT 47.0   < >  --   --  52.8*   < > 35.7*   < > 39.0 38.0* 35.0*  --   PLT 170  --   --   --  153  --  122*  --   --   --   --   --   APTT  --   --   --   --   --   --  >200*  --   --   --   --  155*  HEPARINUNFRC  --   --   --   --   --   --  >1.10*  --   --   --   --  0.81*  CREATININE  --    < > 1.51* 1.72*  --   --  1.41*  --   --   --   --   --   TROPONINIHS  --   --  32* 42*  --   --   --   --   --   --   --   --    < > = values in this interval not displayed.    Estimated Creatinine Clearance: 30.9 mL/min (A) (by C-G formula based on SCr of 1.41 mg/dL (H)).  Assessment: 89yoM admitted for encephalopathy presumed secondary to COVID pneumonia from nursing facility. Was on apixaban prior to admission, with last dose based on facility [facility] on 11/22 at 2100. CBC is currently WNL and stable. Pt does appear to have an AKI with uptrending Scr. Will follow aPTT levels with HL until confirmed correlating.  11/25 AM: heparin level returned at >1.1 and aPTT >200 seconds. Per discussion with RN, heparin stopped for 10 minutes, line flushed, and 10 cc wasted. Based on this information and not having ability to stick patient at this time, will dose based on levels. No signs/symptoms of bleeding.  11/25 PM: heparin level  0.81 and aPTT 155 sec remains supratherapeutic after holding infusion x1h and rate decrease to 800 units/hr.  Confirmed with RN level was drawn appropriately (infusion held, line flushed before drawing).  No other issues noted.    Goal of Therapy:  Heparin level 0.3-0.7 units/ml aPTT 66-102 seconds  Monitor platelets by anticoagulation protocol: Yes   Plan:  Hold heparin for 1 hour Decrease heparin infusion to 700 units/hr Check anti-Xa and APTT level in 8 hours and daily while on heparin Continue to monitor H&H and platelets  Trixie Rude, PharmD Clinical Pharmacist 11/26/2022  4:22 PM

## 2022-11-27 ENCOUNTER — Inpatient Hospital Stay (HOSPITAL_COMMUNITY): Payer: Medicare Other

## 2022-11-27 DIAGNOSIS — U071 COVID-19: Secondary | ICD-10-CM | POA: Diagnosis not present

## 2022-11-27 DIAGNOSIS — J9602 Acute respiratory failure with hypercapnia: Secondary | ICD-10-CM | POA: Diagnosis not present

## 2022-11-27 DIAGNOSIS — J9601 Acute respiratory failure with hypoxia: Secondary | ICD-10-CM | POA: Diagnosis not present

## 2022-11-27 LAB — COMPREHENSIVE METABOLIC PANEL
ALT: 36 U/L (ref 0–44)
AST: 33 U/L (ref 15–41)
Albumin: 2.3 g/dL — ABNORMAL LOW (ref 3.5–5.0)
Alkaline Phosphatase: 41 U/L (ref 38–126)
Anion gap: 7 (ref 5–15)
BUN: 17 mg/dL (ref 8–23)
CO2: 31 mmol/L (ref 22–32)
Calcium: 8.2 mg/dL — ABNORMAL LOW (ref 8.9–10.3)
Chloride: 107 mmol/L (ref 98–111)
Creatinine, Ser: 0.93 mg/dL (ref 0.61–1.24)
GFR, Estimated: >60 mL/min (ref >60)
Glucose, Bld: 190 mg/dL — ABNORMAL HIGH (ref 70–99)
Potassium: 4 mmol/L (ref 3.5–5.1)
Sodium: 145 mmol/L (ref 135–145)
Total Bilirubin: 0.7 mg/dL (ref <1.2)
Total Protein: 4.8 g/dL — ABNORMAL LOW (ref 6.5–8.1)

## 2022-11-27 LAB — GLUCOSE, CAPILLARY
Glucose-Capillary: 102 mg/dL — ABNORMAL HIGH (ref 70–99)
Glucose-Capillary: 107 mg/dL — ABNORMAL HIGH (ref 70–99)
Glucose-Capillary: 123 mg/dL — ABNORMAL HIGH (ref 70–99)
Glucose-Capillary: 123 mg/dL — ABNORMAL HIGH (ref 70–99)
Glucose-Capillary: 14 mg/dL — CL (ref 70–99)
Glucose-Capillary: 15 mg/dL — CL (ref 70–99)
Glucose-Capillary: 53 mg/dL — ABNORMAL LOW (ref 70–99)
Glucose-Capillary: 71 mg/dL (ref 70–99)
Glucose-Capillary: 89 mg/dL (ref 70–99)

## 2022-11-27 LAB — HEPARIN LEVEL (UNFRACTIONATED)
Heparin Unfractionated: 0.61 [IU]/mL (ref 0.30–0.70)
Heparin Unfractionated: 0.62 [IU]/mL (ref 0.30–0.70)

## 2022-11-27 LAB — PHOSPHORUS
Phosphorus: 2.7 mg/dL (ref 2.5–4.6)
Phosphorus: 2.1 mg/dL — ABNORMAL LOW (ref 2.5–4.6)

## 2022-11-27 LAB — CBC
HCT: 38.8 % — ABNORMAL LOW (ref 39.0–52.0)
Hemoglobin: 12.9 g/dL — ABNORMAL LOW (ref 13.0–17.0)
MCH: 32.1 pg (ref 26.0–34.0)
MCHC: 33.2 g/dL (ref 30.0–36.0)
MCV: 96.5 fL (ref 80.0–100.0)
Platelets: 107 10*3/uL — ABNORMAL LOW (ref 150–400)
RBC: 4.02 MIL/uL — ABNORMAL LOW (ref 4.22–5.81)
RDW: 15.7 % — ABNORMAL HIGH (ref 11.5–15.5)
WBC: 9.2 10*3/uL (ref 4.0–10.5)
nRBC: 0 % (ref 0.0–0.2)

## 2022-11-27 LAB — APTT: aPTT: 108 s — ABNORMAL HIGH (ref 24–36)

## 2022-11-27 LAB — MAGNESIUM
Magnesium: 1.8 mg/dL (ref 1.7–2.4)
Magnesium: 1.7 mg/dL (ref 1.7–2.4)

## 2022-11-27 MED ORDER — LIDOCAINE VISCOUS HCL 2 % MT SOLN
15.0000 mL | Freq: Once | OROMUCOSAL | Status: AC
  Administered 2022-11-27: 15 mL via OROMUCOSAL
  Filled 2022-11-27: qty 15

## 2022-11-27 MED ORDER — DEXTROSE 50 % IV SOLN: 25.0000 g | INTRAVENOUS | Status: AC

## 2022-11-27 MED ORDER — DEXTROSE IN LACTATED RINGERS 5 % IV SOLN: INTRAVENOUS | Status: DC

## 2022-11-27 MED ORDER — VITAL 1.5 CAL PO LIQD
1000.0000 mL | ORAL | Status: DC
  Administered 2022-11-27: 1000 mL

## 2022-11-27 MED ORDER — THIAMINE MONONITRATE 100 MG PO TABS
100.0000 mg | ORAL_TABLET | Freq: Every day | ORAL | Status: DC
  Administered 2022-11-27 – 2022-11-29 (×2): 100 mg
  Filled 2022-11-27 (×3): qty 1

## 2022-11-27 MED ORDER — LABETALOL HCL 5 MG/ML IV SOLN: 10.0000 mg | INTRAVENOUS | Status: DC | PRN

## 2022-11-27 MED ORDER — DEXTROSE 50 % IV SOLN: 12.5000 g | INTRAVENOUS | Status: DC

## 2022-11-27 MED ORDER — OLANZAPINE 5 MG PO TBDP
5.0000 mg | ORAL_TABLET | Freq: Once | ORAL | Status: DC
  Filled 2022-11-27: qty 1

## 2022-11-27 MED ORDER — MAGNESIUM SULFATE 2 GM/50ML IV SOLN
2.0000 g | Freq: Once | INTRAVENOUS | Status: AC
  Administered 2022-11-27: 2 g via INTRAVENOUS
  Filled 2022-11-27: qty 50

## 2022-11-27 MED ORDER — IOHEXOL 300 MG/ML  SOLN
50.0000 mL | Freq: Once | INTRAMUSCULAR | Status: DC | PRN
Start: 2022-11-27 — End: 2022-12-05

## 2022-11-27 MED ORDER — DEXTROSE 50 % IV SOLN
12.5000 g | Freq: Once | INTRAVENOUS | Status: AC
  Administered 2022-11-27: 12.5 g via INTRAVENOUS
  Filled 2022-11-27: qty 50

## 2022-11-27 MED ORDER — DEXTROSE 50 % IV SOLN
INTRAVENOUS | Status: AC
  Administered 2022-11-27: 25 g via INTRAVENOUS
  Filled 2022-11-27: qty 50

## 2022-11-27 MED ORDER — ORAL CARE MOUTH RINSE: 15.0000 mL | OROMUCOSAL | Status: DC | PRN

## 2022-11-27 NOTE — Progress Notes (Signed)
Pharmacist Heart Failure Core Measure Documentation  Assessment: Robert Carpenter has an EF documented as 40-45% on January 2023 by ECHO.  Rationale: Heart failure patients with left ventricular systolic dysfunction (LVSD) and an EF < 40% should be prescribed an angiotensin converting enzyme inhibitor (ACEI) or angiotensin receptor blocker (ARB) at discharge unless a contraindication is documented in the medical record.  This patient is not currently on an ACEI or ARB for HF.  This note is being placed in the record in order to provide documentation that a contraindication to the use of these agents is present for this encounter.  ACE Inhibitor or Angiotensin Receptor Blocker is contraindicated (specify all that apply)  []   ACEI allergy AND ARB allergy []   Angioedema []   Moderate or severe aortic stenosis []   Hyperkalemia [x]   Hypotension []   Renal artery stenosis []   Worsening renal function, preexisting renal disease or dysfunction  Patient was on ARB prior to admission. He is now off of vasopressors but BP remains soft. Will monitor BP to resume ARB when appropriate.   [name] 11/27/2022 11:48 AM

## 2022-11-27 NOTE — Progress Notes (Signed)
PHARMACY - ANTICOAGULATION CONSULT NOTE  Pharmacy Consult for heparin infusion Indication: atrial fibrillation  No Known Allergies  Patient Measurements: Height: 6\' 2"  (188 cm) Weight: 59.2 kg (130 lb 8.2 oz) IBW/kg (Calculated) : 82.2 Heparin Dosing Weight: 59.2 kg  Vital Signs: Temp: 97 F (36.1 C) (11/26 0844) Temp Source: Bladder (11/26 0741) BP: 108/77 (11/26 0000) Pulse Rate: 92 (11/26 0844)  Labs: Recent Labs    11/24/22 1605 11/25/22 0119 11/25/22 0121 11/25/22 0254 11/26/22 0123 11/26/22 0130 11/26/22 1336 11/26/22 1427 11/26/22 1705 11/27/22 0030 11/27/22 0400 11/27/22 0954  HGB  --   --  16.6   < > 11.5*   < > 11.9*  --  12.6*  --  12.9*  --   HCT  --   --  52.8*   < > 35.7*   < > 35.0*  --  37.0*  --  38.8*  --   PLT  --   --  153  --  122*  --   --   --   --   --  107*  --   APTT  --   --   --   --  >200*  --   --  155*  --  108*  --   --   HEPARINUNFRC  --   --   --    < > >1.10*  --   --  0.81*  --  0.62  --  0.61  CREATININE 1.51* 1.72*  --   --  1.41*  --   --   --   --   --   --  0.93  TROPONINIHS 32* 42*  --   --   --   --   --   --   --   --   --   --    < > = values in this interval not displayed.    Estimated Creatinine Clearance: 46.9 mL/min (by C-G formula based on SCr of 0.93 mg/dL).  Assessment: 89yoM admitted for encephalopathy presumed secondary to COVID pneumonia from nursing facility. Was on apixaban prior to admission, with last dose based on facility [facility] on 11/22 at 2100. Pharmacy consulted to start IV Heparin therapy.   Heparin level is therapeutic (0.61) on 700 units/hr. Therapeutic levels x2.  H/H low-stable. Platelets trending down at 107 - monitoring closely. No bleeding reported.    Goal of Therapy:  Heparin level 0.3-0.7 units/ml Monitor platelets by anticoagulation protocol: Yes   Plan:  Continue heparin infusion at 700 units/hr Daily heparin level and CBC  Continue to monitor H&H and platelets Follow-up ability to  resume apixaban therapy  Link Snuffer, PharmD, BCPS, BCCCP Please refer to [facility] for [facility] Pharmacy numbers 11/27/2022 11:33 AM

## 2022-11-27 NOTE — Progress Notes (Signed)
NAME:  Robert Carpenter, MRN:  [id number], DOB:  04-May-1933, LOS: 3 ADMISSION DATE:  11/24/2022, CONSULTATION DATE: 11/24 REFERRING MD: Dr. Lazarus Salines, CHIEF COMPLAINT: COVID-19 pneumonia, respiratory failure  History of Present Illness:  Patient is encephalopathic and/or intubated; therefore, history has been obtained from chart review.  89 year old male with past medical history as below, which is significant for chronic atrial fibrillation, cardiac arrest, hypertension, dysphagia, aspiration pneumonia and HFrEF.  At baseline he resides in skilled nursing facility and does have some baseline disorientation.  Confusion seems to be worse at night.  Overall course initially began to deteriorate a few years ago after severe COVID illness which required prolonged intubation and was complicated by cardiac arrest.  He underwent trach and PEG at that time both of which have been taken down.  More recently has been struggling with dysphagia and as result has not been eating well and has been losing weight.  He also struggles with recurrent pneumonia likely secondary to aspiration.  On 11/24 the patient was noted to have worsening encephalopathy complicated by agitation at the nursing facility.  Upon arrival to the emergency department he was fortunately conversant.  Laboratory evaluation was significant for hyperkalemia and respiratory acidosis.  Temporizing medications were given and the patient was placed on BiPAP.  Initial VBG did improve.  However, the patient struggled to keep BiPAP on and was growing increasingly agitated despite multiple sedative doses.  COVID PCR resulted as positive.  He was admitted to the hospital service and started on COVID treatment with dexamethasone and remdesivir.  Unfortunately he was unable to tolerate BiPAP due to agitation and ultimately became somnolent secondary to sedative medications plus acidosis.  PCCM was consulted for ICU transfer and possible intubation.  Pertinent   Medical History   Past Medical History:  Diagnosis Date   Acute on chronic respiratory failure with hypoxia (HCC)    Cardiac arrest [facility])    Cardiac arrest (HCC)    Chronic atrial fibrillation (HCC)    COVID-19 virus infection    Hypertension     Significant Hospital Events: Including procedures, antibiotic start and stop dates in addition to other pertinent events   11/24 presented from SNF with altered mental status, COVID positive.  Hypercapnia seen on ABG.  Initially did not tolerate BiPAP due to agitation however reattempted this a.m. and currently compliant 11/24 Doing better on NIV since switched to AVAPS 11/25 swapped back from AVAPS due to low Vt  Interim History / Subjective:  No acute events overnight Remains on BiPAP  Objective   Blood pressure 108/77, pulse 92, temperature (!) 97 F (36.1 C), resp. rate 18, height 6\' 2"  (1.88 m), weight 59.2 kg, SpO2 99%.    FiO2 (%):  [30 %] 30 %   Intake/Output Summary (Last 24 hours) at 11/27/2022 0924 Last data filed at 11/27/2022 0600 Gross per 24 hour  Intake 2877.8 ml  Output 1200 ml  Net 1677.8 ml   Filed Weights   11/25/22 0300 11/25/22 1100  Weight: 59.2 kg 59.2 kg    Examination: General: Frail elderly gentleman on BiPAP HEENT: [place]/AT, BiPAP in place. Neuro: Eyes open to verbal. Follows commands in all 4 extremities.  CV: RRR, no MRG PULM: Clear bilateral breath sounds on BiPAP. Good seal.  GI: soft, non-tender, non-distended.  GU: Foley catheter in place with dark-colored urine. Extremities: No acute deformity or ROM limitation Skin: no rashes or lesions  Ancillary test personally reviewed:  CBGs low ABG improved and is likely  at baseline.  Mag 1.8 No Chemistry this morning.  Hemoglobin 12.9 which is his recent baseline.    Assessment & Plan:   COVID-19 pneumonia causing acute respiratory failure with hypoxia and hypercarbia and acute metabolic encephalopathy - s/p 3 days Remdesivir  - Continue  dexamethasone for 10 total days - PRN BiPAP, has been on essentially non-stop for 2+ days now. Will trial off.  - Precedex for BiPAP compliance. Wean as tolerated for RASS goal 0. - ABG PRN - Complete course of ceftriaxone to rule out superimposed bacterial infection.   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 - troponin only mildly elevated - Observe for signs of ischemia.   AKI with lactic acidosis.  Improving - repeat chemistry today  Severe malnutrition due to longstanding dysphagia. - cortrak unable to be placed - If tolerates trial off BiPAP can get swallow eval and try to feed.   Best Practice (right click and "Reselect all SmartList Selections" daily)   Diet/type: NPO to start tube feeds. DVT prophylaxis:   Pressure ulcer(s):  GI prophylaxis: PPI Lines: N/A Foley: In place and is still required. Code Status:  full code Last date of multidisciplinary goals of care discussion: Family discussions held overnight, will update family on arrival vs over the phone   Critical care time: 41 min    Joneen Roach, AGACNP-BC [facility]  See Amion for personal pager PCCM on call pager [phone] until 7pm. Please call Elink 7p-7a. [phone]  11/27/2022 9:59 AM     11/27/2022, 9:24 AM

## 2022-11-27 NOTE — Progress Notes (Signed)
PT Cancellation Note  Patient Details Name: ELDRICK YAHNKE MRN: [id number] DOB: [birth year]   Cancelled Treatment:    Reason Eval/Treat Not Completed: Patient at procedure or test/unavailable. Pt being transported to fluoroscopy upon PT arrival. PT will follow up as time allows.   Arlyss Gandy 11/27/2022, 2:07 PM

## 2022-11-27 NOTE — Progress Notes (Signed)
Nutrition Brief Note  NGT placed in fluoroscopy today. Per Radiology, tip is gastric though not in distal stomach. Reached out to MD regarding initiation of TF. MD in agreement.   TF recommendations from RD note 11/25: - Start Vital 1.5 @ 20 ml/hr and advance rate by 10 ml every 8 hours to goal rate of 50 ml/hr (1200 ml/day)  Tube feeding regimen provides 1800 kcal, 81 grams of protein, and 917 ml of H2O.    Monitor magnesium, potassium, and phosphorus every 12 hours for at least 6 occurrences. MD to replete as needed as pt is at risk for refeeding syndrome given severe malnutrition.   - Thiamine 100 mg daily per tube x 5 days due to refeeding risk  Drusilla Kanner, RDN, LDN Clinical Nutrition

## 2022-11-27 NOTE — Progress Notes (Signed)
Pt. Remains on trial off bipap. Vital signs are stable at this.

## 2022-11-27 NOTE — Progress Notes (Addendum)
PHARMACY - ANTICOAGULATION CONSULT NOTE  Pharmacy Consult for heparin infusion Indication: atrial fibrillation  No Known Allergies  Patient Measurements: Height: 6\' 2"  (188 cm) Weight: 59.2 kg (130 lb 8.2 oz) IBW/kg (Calculated) : 82.2 Heparin Dosing Weight: 59.2 kg  Vital Signs: Temp: 98.2 F (36.8 C) (11/26 0030) Temp Source: Bladder (11/26 0000) BP: 108/77 (11/26 0000) Pulse Rate: 101 (11/26 0015)  Labs: Recent Labs    11/24/22 1055 11/24/22 1123 11/24/22 1605 11/25/22 0119 11/25/22 0121 11/25/22 0254 11/26/22 0123 11/26/22 0130 11/26/22 1237 11/26/22 1336 11/26/22 1427 11/26/22 1705 11/27/22 0030  HGB 15.5   < >  --   --  16.6   < > 11.5*   < > 12.9* 11.9*  --  12.6*  --   HCT 47.0   < >  --   --  52.8*   < > 35.7*   < > 38.0* 35.0*  --  37.0*  --   PLT 170  --   --   --  153  --  122*  --   --   --   --   --   --   APTT  --   --   --   --   --   --  >200*  --   --   --  155*  --  108*  HEPARINUNFRC  --   --   --   --   --   --  >1.10*  --   --   --  0.81*  --  0.62  CREATININE  --    < > 1.51* 1.72*  --   --  1.41*  --   --   --   --   --   --   TROPONINIHS  --   --  32* 42*  --   --   --   --   --   --   --   --   --    < > = values in this interval not displayed.    Estimated Creatinine Clearance: 30.9 mL/min (A) (by C-G formula based on SCr of 1.41 mg/dL (H)).  Assessment: 89yoM admitted for encephalopathy presumed secondary to COVID pneumonia from nursing facility. Was on apixaban prior to admission, with last dose based on facility [facility] on 11/22 at 2100. CBC is currently WNL and stable. Pt does appear to have an AKI with uptrending Scr. Will follow aPTT levels with HL until confirmed correlating.  11/25 AM: heparin level returned at >1.1 and aPTT >200 seconds. Per discussion with RN, heparin stopped for 10 minutes, line flushed, and 10 cc wasted. Based on this information and not having ability to stick patient at this time, will dose based on levels. No  signs/symptoms of bleeding.  11/26 AM: heparin level 0.62 (therapeutic) and aPTT 108 sec after rate decrease to 700 units/hr.  Confirmed with RN level was drawn appropriately (infusion held, line flushed before drawing).  No other issues noted or signs/symptoms of bleeding   Goal of Therapy:  Heparin level 0.3-0.7 units/ml Monitor platelets by anticoagulation protocol: Yes   Plan:  Continue heparin infusion at 700 units/hr Check confirmatory anti-Xa in 8 hours and daily while on heparin Continue to monitor H&H and platelets  Arabella Merles, PharmD. Clinical Pharmacist 11/27/2022 2:25 AM

## 2022-11-27 NOTE — Plan of Care (Signed)
  Problem: Education: Goal: Knowledge of risk factors and measures for prevention of condition will improve Outcome: Not Progressing   Problem: Coping: Goal: Psychosocial and spiritual needs will be supported Outcome: Not Progressing   Problem: Respiratory: Goal: Will maintain a patent airway Outcome: Not Progressing Goal: Complications related to the disease process, condition or treatment will be avoided or minimized Outcome: Not Progressing   Problem: Education: Goal: Knowledge of General Education information will improve Description: Including pain rating scale, medication(s)/side effects and non-pharmacologic comfort measures Outcome: Not Progressing   Problem: Health Behavior/Discharge Planning: Goal: Ability to manage health-related needs will improve Outcome: Not Progressing   Problem: Clinical Measurements: Goal: Ability to maintain clinical measurements within normal limits will improve Outcome: Not Progressing Goal: Will remain free from infection Outcome: Not Progressing Goal: Diagnostic test results will improve Outcome: Not Progressing Goal: Respiratory complications will improve Outcome: Not Progressing Goal: Cardiovascular complication will be avoided Outcome: Not Progressing   Problem: Activity: Goal: Risk for activity intolerance will decrease Outcome: Not Progressing   Problem: Nutrition: Goal: Adequate nutrition will be maintained Outcome: Not Progressing   Problem: Coping: Goal: Level of anxiety will decrease Outcome: Not Progressing   Problem: Elimination: Goal: Will not experience complications related to bowel motility Outcome: Not Progressing Goal: Will not experience complications related to urinary retention Outcome: Not Progressing   Problem: Pain Management: Goal: General experience of comfort will improve Outcome: Not Progressing   Problem: Safety: Goal: Ability to remain free from injury will improve Outcome: Not Progressing    Problem: Skin Integrity: Goal: Risk for impaired skin integrity will decrease Outcome: Not Progressing   Problem: Education: Goal: Ability to describe self-care measures that may prevent or decrease complications (Diabetes Survival Skills Education) will improve Outcome: Not Progressing Goal: Individualized Educational Video(s) Outcome: Not Progressing   Problem: Coping: Goal: Ability to adjust to condition or change in health will improve Outcome: Not Progressing   Problem: Fluid Volume: Goal: Ability to maintain a balanced intake and output will improve Outcome: Not Progressing   Problem: Health Behavior/Discharge Planning: Goal: Ability to identify and utilize available resources and services will improve Outcome: Not Progressing Goal: Ability to manage health-related needs will improve Outcome: Not Progressing   Problem: Metabolic: Goal: Ability to maintain appropriate glucose levels will improve Outcome: Not Progressing   Problem: Nutritional: Goal: Maintenance of adequate nutrition will improve Outcome: Not Progressing Goal: Progress toward achieving an optimal weight will improve Outcome: Not Progressing   Problem: Skin Integrity: Goal: Risk for impaired skin integrity will decrease Outcome: Not Progressing   Problem: Tissue Perfusion: Goal: Adequacy of tissue perfusion will improve Outcome: Not Progressing   Problem: Safety: Goal: Non-violent Restraint(s) Outcome: Not Progressing

## 2022-11-28 DIAGNOSIS — J9601 Acute respiratory failure with hypoxia: Secondary | ICD-10-CM | POA: Diagnosis not present

## 2022-11-28 DIAGNOSIS — U071 COVID-19: Secondary | ICD-10-CM | POA: Diagnosis not present

## 2022-11-28 DIAGNOSIS — J9602 Acute respiratory failure with hypercapnia: Secondary | ICD-10-CM | POA: Diagnosis not present

## 2022-11-28 LAB — BASIC METABOLIC PANEL
Anion gap: 15 (ref 5–15)
BUN: 19 mg/dL (ref 8–23)
CO2: 23 mmol/L (ref 22–32)
Calcium: 8.5 mg/dL — ABNORMAL LOW (ref 8.9–10.3)
Chloride: 105 mmol/L (ref 98–111)
Creatinine, Ser: 0.98 mg/dL (ref 0.61–1.24)
GFR, Estimated: >60 mL/min (ref >60)
Glucose, Bld: 108 mg/dL — ABNORMAL HIGH (ref 70–99)
Potassium: 4.6 mmol/L (ref 3.5–5.1)
Sodium: 143 mmol/L (ref 135–145)

## 2022-11-28 LAB — POCT I-STAT 7, (LYTES, BLD GAS, ICA,H+H)
Acid-Base Excess: 6 mmol/L — ABNORMAL HIGH (ref 0.0–2.0)
Bicarbonate: 32.6 mmol/L — ABNORMAL HIGH (ref 20.0–28.0)
Calcium, Ion: 1.21 mmol/L (ref 1.15–1.40)
HCT: 43 % (ref 39.0–52.0)
Hemoglobin: 14.6 g/dL (ref 13.0–17.0)
O2 Saturation: 78 %
Potassium: 3.6 mmol/L (ref 3.5–5.1)
Sodium: 141 mmol/L (ref 135–145)
TCO2: 34 mmol/L — ABNORMAL HIGH (ref 22–32)
pCO2 arterial: 55.7 mm[Hg] — ABNORMAL HIGH (ref 32–48)
pH, Arterial: 7.375 (ref 7.35–7.45)
pO2, Arterial: 45 mm[Hg] — ABNORMAL LOW (ref 83–108)

## 2022-11-28 LAB — HEPARIN LEVEL (UNFRACTIONATED)
Heparin Unfractionated: 0.35 [IU]/mL (ref 0.30–0.70)
Heparin Unfractionated: 0.47 [IU]/mL (ref 0.30–0.70)

## 2022-11-28 LAB — GLUCOSE, CAPILLARY
Glucose-Capillary: 107 mg/dL — ABNORMAL HIGH (ref 70–99)
Glucose-Capillary: 110 mg/dL — ABNORMAL HIGH (ref 70–99)
Glucose-Capillary: 112 mg/dL — ABNORMAL HIGH (ref 70–99)
Glucose-Capillary: 161 mg/dL — ABNORMAL HIGH (ref 70–99)
Glucose-Capillary: 185 mg/dL — ABNORMAL HIGH (ref 70–99)
Glucose-Capillary: 190 mg/dL — ABNORMAL HIGH (ref 70–99)
Glucose-Capillary: 49 mg/dL — ABNORMAL LOW (ref 70–99)
Glucose-Capillary: 73 mg/dL (ref 70–99)

## 2022-11-28 LAB — APTT: aPTT: 60 s — ABNORMAL HIGH (ref 24–36)

## 2022-11-28 LAB — MAGNESIUM
Magnesium: 1.9 mg/dL (ref 1.7–2.4)
Magnesium: 1.9 mg/dL (ref 1.7–2.4)

## 2022-11-28 LAB — PHOSPHORUS
Phosphorus: 1.7 mg/dL — ABNORMAL LOW (ref 2.5–4.6)
Phosphorus: 2.8 mg/dL (ref 2.5–4.6)

## 2022-11-28 MED ORDER — SODIUM PHOSPHATES 45 MMOLE/15ML IV SOLN
30.0000 mmol | Freq: Once | INTRAVENOUS | Status: AC
  Administered 2022-11-28: 30 mmol via INTRAVENOUS
  Filled 2022-11-28: qty 10

## 2022-11-28 MED ORDER — SERTRALINE HCL 25 MG PO TABS
25.0000 mg | ORAL_TABLET | Freq: Every day | ORAL | Status: DC
  Administered 2022-11-28 – 2022-12-04 (×7): 25 mg via ORAL
  Filled 2022-11-28 (×7): qty 1

## 2022-11-28 MED ORDER — DEXTROSE 10 % IV SOLN: INTRAVENOUS | Status: AC

## 2022-11-28 MED ORDER — BETHANECHOL CHLORIDE 10 MG PO TABS
10.0000 mg | ORAL_TABLET | Freq: Three times a day (TID) | ORAL | Status: AC
  Administered 2022-11-28 – 2022-12-01 (×9): 10 mg via ORAL
  Filled 2022-11-28 (×9): qty 1

## 2022-11-28 MED ORDER — ORAL CARE MOUTH RINSE
15.0000 mL | OROMUCOSAL | Status: DC
  Administered 2022-11-28 – 2022-12-04 (×20): 15 mL via OROMUCOSAL

## 2022-11-28 MED ORDER — TRAZODONE HCL 50 MG PO TABS
50.0000 mg | ORAL_TABLET | Freq: Every day | ORAL | Status: DC
  Administered 2022-11-28 – 2022-12-03 (×6): 50 mg via ORAL
  Filled 2022-11-28 (×6): qty 1

## 2022-11-28 MED ORDER — ORAL CARE MOUTH RINSE: 15.0000 mL | OROMUCOSAL | Status: DC | PRN

## 2022-11-28 MED ORDER — CHLORHEXIDINE GLUCONATE CLOTH 2 % EX PADS
6.0000 | MEDICATED_PAD | Freq: Every day | CUTANEOUS | Status: DC
Start: 2022-11-29 — End: 2022-12-05
  Administered 2022-11-29 – 2022-12-04 (×6): 6 via TOPICAL

## 2022-11-28 NOTE — Progress Notes (Signed)
PT Cancellation Note  Patient Details Name: Robert Carpenter MRN: [id number] DOB: Aug 20, 1933   Cancelled Treatment:    Reason Eval/Treat Not Completed: Medical issues which prohibited therapy. RN reports restarting precedex recently due to agitation. PT will follow up when the pt is able to wean sedation and participate in PT.   Arlyss Gandy 11/28/2022, 12:37 PM

## 2022-11-28 NOTE — Progress Notes (Signed)
NAME:  Robert Carpenter, MRN:  [id number], DOB:  1933-07-19, LOS: 4 ADMISSION DATE:  11/24/2022, CONSULTATION DATE: 11/24 REFERRING MD: Dr. Lazarus Salines, CHIEF COMPLAINT: COVID-19 pneumonia, respiratory failure  History of Present Illness:  Patient is encephalopathic and/or intubated; therefore, history has been obtained from chart review.  89 year old male with past medical history as below, which is significant for chronic atrial fibrillation, cardiac arrest, hypertension, dysphagia, aspiration pneumonia and HFrEF.  At baseline he resides in skilled nursing facility and does have some baseline disorientation.  Confusion seems to be worse at night.  Overall course initially began to deteriorate a few years ago after severe COVID illness which required prolonged intubation and was complicated by cardiac arrest.  He underwent trach and PEG at that time both of which have been taken down.  More recently has been struggling with dysphagia and as result has not been eating well and has been losing weight.  He also struggles with recurrent pneumonia likely secondary to aspiration.  On 11/24 the patient was noted to have worsening encephalopathy complicated by agitation at the nursing facility.  Upon arrival to the emergency department he was fortunately conversant.  Laboratory evaluation was significant for hyperkalemia and respiratory acidosis.  Temporizing medications were given and the patient was placed on BiPAP.  Initial VBG did improve.  However, the patient struggled to keep BiPAP on and was growing increasingly agitated despite multiple sedative doses.  COVID PCR resulted as positive.  He was admitted to the hospital service and started on COVID treatment with dexamethasone and remdesivir.  Unfortunately he was unable to tolerate BiPAP due to agitation and ultimately became somnolent secondary to sedative medications plus acidosis.  PCCM was consulted for ICU transfer and possible intubation.  Pertinent   Medical History   Past Medical History:  Diagnosis Date   Acute on chronic respiratory failure with hypoxia (HCC)    Cardiac arrest [facility])    Cardiac arrest (HCC)    Chronic atrial fibrillation (HCC)    COVID-19 virus infection    Hypertension     Significant Hospital Events: Including procedures, antibiotic start and stop dates in addition to other pertinent events   11/24 presented from SNF with altered mental status, COVID positive.  Hypercapnia seen on ABG.  Initially did not tolerate BiPAP due to agitation however reattempted this a.m. and currently compliant 11/24 Doing better on NIV since switched to AVAPS 11/25 swapped back from AVAPS due to low Vt 11/26 interventional radiology placed core track 11/27 patient removed core track overnight with concerns for aspiration event during dislodgment  Interim History / Subjective:  Seen lying in bed altered tachypneic and tachycardic  Objective   Blood pressure 128/82, pulse 91, temperature 99.5 F (37.5 C), resp. rate (!) 30, height 6\' 2"  (1.88 m), weight 59.2 kg, SpO2 (!) 89%.    FiO2 (%):  [30 %] 30 %   Intake/Output Summary (Last 24 hours) at 11/28/2022 0748 Last data filed at 11/28/2022 0700 Gross per 24 hour  Intake 1413.76 ml  Output 925 ml  Net 488.76 ml   Filed Weights   11/25/22 0300 11/25/22 1100 11/28/22 0500  Weight: 59.2 kg 59.2 kg 59.2 kg    Examination: General: Acute on chronic ill-appearing thin deconditioned frail middle-aged male lying in bed in no acute distress HEENT: ETT, MM pink/moist, PERRL,  Neuro: Opens eyes to verbal stimuli, unable to follow commands CV: s1s2 regular rate and rhythm, no murmur, rubs, or gallops,  PULM: Slight rhonchi bilaterally, tachypnea,  no increased work of breathing, tolerating high flow nasal cannula GI: soft, bowel sounds active in all 4 quadrants, non-tender, non-distended Extremities: warm/dry, no edema  Skin: no rashes or lesions  Resolved problems:  AKI Lactic  acidosis  Assessment & Plan:   COVID-19 pneumonia causing acute respiratory failure with hypoxia and hypercarbia and acute metabolic encephalopathy - s/p 3 days Remdesivir  -Completed course of ceftriaxone Concern for aspiration event -Patient partially removed cortrack concern for aspiration event P: Continue high flow nasal cannula Remains at risk for needing advanced airway, continue ICU monitoring As needed BiPAP Can utilize Precedex with BiPAP compliance but would need to remove it restraints Follow intermittent ABGs and chest x-rays Aspiration precautions Continue to follow CBC and fever curve for need to reinitiate antibiotics  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) HTN Atrial flutter with RVR Demand-related ischemia P: Continuous telemetry  Strict intake and output  Daily weight to assess volume status Daily assessment for need to diurese  Closely monitor renal function and electrolytes  Supplemental oxygen as above Hold home losartan As needed IV antihypertensives Continue heparin drip  Severe malnutrition due to longstanding dysphagia P: Core track dislodged day of placement SLP trial when abe Will discuss with family potential need for repeat PEG tube placement  Best Practice (right click and "Reselect all SmartList Selections" daily)   Diet/type: NPO to start tube feeds. DVT prophylaxis:   Pressure ulcer(s):  GI prophylaxis: PPI Lines: N/A Foley: In place and is still required. Code Status:  full code Last date of multidisciplinary goals of care discussion: Called and spoke with [name] over the phone morning of 11/20 7 see separate IPAL note   Critical care time:   CRITICAL CARE Performed by: Uva Runkel D. Harris  Total critical care time: 38 minutes  Critical care time was exclusive of separately billable procedures and treating other patients.  Critical care was necessary to treat or prevent imminent or life-threatening  deterioration.  Critical care was time spent personally by me on the following activities: development of treatment plan with patient and/or surrogate as well as nursing, discussions with consultants, evaluation of patient's response to treatment, examination of patient, obtaining history from patient or surrogate, ordering and performing treatments and interventions, ordering and review of laboratory studies, ordering and review of radiographic studies, pulse oximetry and re-evaluation of patient's condition.  Kesler Wickham D. Harris, NP-C [facility] Personal contact information can be found on Amion  If no contact or response made please call [phone] 11/28/2022, 7:50 AM

## 2022-11-28 NOTE — IPAL (Addendum)
  Interdisciplinary Goals of Care Family Meeting   Date carried out: 11/28/2022  Location of the meeting: Phone conference  Member's involved: Nurse Practitioner and Family Member or next of kin  Durable Power of Attorney or acting medical decision maker: Shared among son and daughter  Discussion: Jeanene Erb and spoke to [name] (son) for morning update 11/27.  We discussed [place]urn's overall clinical picture and progression thus far.  [name] was informed that unfortunately cortrak was dislodged overnight and that we will attempt an SLP evaluation to see if swallowing is safe.  If unable to safely consume oral diet family would like to continue all aggressive interventions including potential replacement of PEG tube.  [name] states that Mikiah is a IT sales professional and would want to continue all aggressive interventions as long as possible.   Code status: Full Code  Disposition: Continue current acute care  Time spent for the meeting: 25 mins  Conni Knighton D. Harris 11/28/2022, 9:30 AM

## 2022-11-28 NOTE — Progress Notes (Signed)
PHARMACY - ANTICOAGULATION CONSULT NOTE  Pharmacy Consult for heparin infusion Indication: atrial fibrillation  No Known Allergies  Patient Measurements: Height: 6\' 2"  (188 cm) Weight: 59.2 kg (130 lb 8.2 oz) IBW/kg (Calculated) : 82.2 Heparin Dosing Weight: 59.2 kg  Vital Signs: Temp: 99.1 F (37.3 C) (11/27 0900) Temp Source: Bladder (11/27 0400) BP: 148/81 (11/27 0900) Pulse Rate: 117 (11/27 0900)  Labs: Recent Labs    11/26/22 0123 11/26/22 0130 11/26/22 1427 11/26/22 1705 11/27/22 0030 11/27/22 0400 11/27/22 0954 11/28/22 0824 11/28/22 0836  HGB 11.5*   < >  --  12.6*  --  12.9*  --   --  14.6  HCT 35.7*   < >  --  37.0*  --  38.8*  --   --  43.0  PLT 122*  --   --   --   --  107*  --   --   --   APTT >200*  --  155*  --  108*  --   --  60*  --   HEPARINUNFRC >1.10*  --  0.81*  --  0.62  --  0.61 0.35  --   CREATININE 1.41*  --   --   --   --   --  0.93 0.98  --    < > = values in this interval not displayed.    Estimated Creatinine Clearance: 44.5 mL/min (by C-G formula based on SCr of 0.98 mg/dL).  Assessment: 89yoM admitted for encephalopathy presumed secondary to COVID pneumonia from nursing facility. Was on apixaban prior to admission, with last dose based on facility [facility] on 11/22 at 2100. Pharmacy consulted to start IV Heparin therapy.   Heparin level is therapeutic (0.35) on heparin infusion at 700 units/hr.  Hgb 14.6 per iSTAT, unable to collect CBC to assess Plt No bleeding reported.    Goal of Therapy:  Heparin level 0.3-0.7 units/ml Monitor platelets by anticoagulation protocol: Yes   Plan:  Increase heparin infusion rate slightly to 750 units/hr with heparin level trending down to low end of therapeutic range Recheck heparin level in 8 hours Daily heparin level and CBC  Continue to monitor H&H and platelets Follow-up ability to resume apixaban therapy  Wilburn Cornelia, PharmD, BCPS Clinical Pharmacist 11/28/2022 10:14 AM   Please refer  to AMION for pharmacy phone number

## 2022-11-28 NOTE — Progress Notes (Signed)
PHARMACY - ANTICOAGULATION CONSULT NOTE  Pharmacy Consult for heparin infusion Indication: atrial fibrillation  No Known Allergies  Patient Measurements: Height: 6\' 2"  (188 cm) Weight: 59.2 kg (130 lb 8.2 oz) IBW/kg (Calculated) : 82.2 Heparin Dosing Weight: 59.2 kg  Vital Signs: Temp: 99.3 F (37.4 C) (11/27 1600) Temp Source: Axillary (11/27 1110) BP: 125/96 (11/27 1800) Pulse Rate: 217 (11/27 1400)  Labs: Recent Labs    11/26/22 0123 11/26/22 0130 11/26/22 1427 11/26/22 1705 11/27/22 0030 11/27/22 0400 11/27/22 0954 11/28/22 0824 11/28/22 0836 11/28/22 1807  HGB 11.5*   < >  --  12.6*  --  12.9*  --   --  14.6  --   HCT 35.7*   < >  --  37.0*  --  38.8*  --   --  43.0  --   PLT 122*  --   --   --   --  107*  --   --   --   --   APTT >200*  --  155*  --  108*  --   --  60*  --   --   HEPARINUNFRC >1.10*  --  0.81*  --  0.62  --  0.61 0.35  --  0.47  CREATININE 1.41*  --   --   --   --   --  0.93 0.98  --   --    < > = values in this interval not displayed.    Estimated Creatinine Clearance: 44.5 mL/min (by C-G formula based on SCr of 0.98 mg/dL).  Assessment: 89yoM admitted for encephalopathy presumed secondary to COVID pneumonia from nursing facility. Was on apixaban prior to admission, with last dose based on facility [facility] on 11/22 at 2100. Pharmacy consulted to dose IV Heparin therapy.   Heparin level therapeutic; no bleeding reported.  Goal of Therapy:  Heparin level 0.3-0.7 units/ml Monitor platelets by anticoagulation protocol: Yes   Plan:  Continue heparin infusion at 750 units/hr  Daily heparin level and CBC  Follow-up ability to resume apixaban therapy  Gil Ingwersen D. Laney Potash, PharmD, BCPS, BCCCP 11/28/2022, 7:00 PM

## 2022-11-28 NOTE — Evaluation (Addendum)
Clinical/Bedside Swallow Evaluation Patient Details  Name: CAMERIN MCLANEY MRN: [id number] Date of Birth: April 04, 1933  Today's Date: 11/28/2022 Time: SLP Start Time (ACUTE ONLY): 0930 SLP Stop Time (ACUTE ONLY): 0945 SLP Time Calculation (min) (ACUTE ONLY): 15 min  Past Medical History:  Past Medical History:  Diagnosis Date   Acute on chronic respiratory failure with hypoxia (HCC)    Cardiac arrest (HCC)    Cardiac arrest (HCC)    Chronic atrial fibrillation (HCC)    COVID-19 virus infection    Hypertension    Past Surgical History:  Past Surgical History:  Procedure Laterality Date   HIP SURGERY     IR REPLC GASTRO/COLONIC TUBE PERCUT W/FLUORO  03/16/2020   IR REPLC GASTRO/COLONIC TUBE PERCUT W/FLUORO  04/07/2020   HPI:  89 year old man who is critically ill due to acute hypoxic and hypercarbic respiratory failure requiring NIV due to COVID pneumonia.   He is SNF-bound at baseline, HFrEF and chronic Afib. He has baseline disorientation but is normally communicative. Pt has a history of cardiac arrest, trach and PEG to recover, now removed. Has has several MBS, the last on 01/10/22 that showed mild residue that clears with a second swallow, no penetration or aspiration. He has had recurrent aspiration pna however. Recommended to consume Mech soft/thin liquids due to impairment in mastication.    Assessment / Plan / Recommendation  Clinical Impression  Pt presents initially as very restless, trying to get out of bed, fearful in restraints, RR in 50s. Pt was inattentive to ice, letting it fall fom his mouth while begging to be let go from restraint, or to lay down. 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 When calmer (though still with rr in 30s and constant phonation with breathing) pt followed commands, participated in session and  attended to sips from a straw and bits of puree. He has coughing with thin liquids, but tolerated thickened liquids at bit better. RN can attempt to give meds crushed in puree or sips of honey thick juice for comfort. However, anticipate pt will not always be capable of taking medications until he is consistently calm.   Right now, respiratory rate is the primary reason for aspiration concern.  When pts swallowing has been tested on MBS this year, he did not aspirate, but repeat study would be needed when pt stable, to determine if swallowing function has declined over the year. Mr Heileman has had recurrent pna, which could be from multiple sources, like post-prandial/reflux related aspiration. Would not advise PEG tube placement for this pt for several reasons: it is unlikely to prevent further episodes of pna and could increase risk of reflux; keeping a pt who is capable of eating and drinking NPO long term due to potential risk of aspiration is unethical; he is very likely to pull it out; and pt doesnt have a prognosis for short term recovery of function given advanced age and cognitive impairment (Pt had a PEG tube previously due to cardiac arrest). There is no evidence that PEG tube placement prolongs life or improves quality of life. Discussed with team. Available to further address pts needs later this week.   SLP Visit Diagnosis: Dysphagia, unspecified (R13.10)    Aspiration Risk  Risk for inadequate nutrition/hydration;Moderate aspiration risk    Diet Recommendation Dysphagia 1 (Puree);Honey-thick liquid  Other  Recommendations Oral Care Recommendations: Oral care QID    Recommendations for follow up therapy are one component of a multi-disciplinary discharge planning process, led by the attending physician.  Recommendations may be updated based on patient status, additional functional criteria and insurance authorization.  Follow up Recommendations        Assistance Recommended  at Discharge    Functional Status Assessment    Frequency and Duration min 2x/week  2 weeks       Prognosis        Swallow Study   General HPI: 89 year old man who is critically ill due to acute hypoxic and hypercarbic respiratory failure requiring NIV due to COVID pneumonia.   He is SNF-bound at baseline, HFrEF and chronic Afib. He has baseline disorientation but is normally communicative. Pt has a history of cardiac arrest, trach and PEG to recover, now removed. Has has several MBS, the last on 01/10/22 that showed mild residue that clears with a second swallow, no penetration or aspiration. He has had recurrent aspiration pna however. Recommended to consume Mech soft/thin liquids due to impairment in mastication. Type of Study: Bedside Swallow Evaluation Previous Swallow Assessment: see HPI Diet Prior to this Study: NPO History of Recent Intubation: No Behavior/Cognition: Confused;Agitated Oral Cavity Assessment: Dry Oral Care Completed by SLP: No Oral Cavity - Dentition: Edentulous Self-Feeding Abilities: Total assist Patient Positioning: Upright in bed Baseline Vocal Quality: Normal Volitional Cough: Cognitively unable to elicit Volitional Swallow: Unable to elicit    Oral/Motor/Sensory Function Overall Oral Motor/Sensory Function: Other (comment)   Ice Chips Ice chips: Impaired Presentation: Spoon Oral Phase Impairments: Poor awareness of bolus Oral Phase Functional Implications: Right anterior spillage;Prolonged oral transit   Thin Liquid Thin Liquid: Impaired Presentation: Straw Pharyngeal  Phase Impairments: Cough - Immediate    Nectar Thick Nectar Thick Liquid: Not tested   Honey Thick Honey Thick Liquid: Impaired Oral Phase Impairments: Poor awareness of bolus Pharyngeal Phase Impairments: Suspected delayed Swallow   Puree Puree: Within functional limits   Solid     Solid: Not tested      Marketa Midkiff, Riley Nearing 11/28/2022,10:13 AM

## 2022-11-29 DIAGNOSIS — U071 COVID-19: Secondary | ICD-10-CM | POA: Diagnosis not present

## 2022-11-29 DIAGNOSIS — J1282 Pneumonia due to coronavirus disease 2019: Secondary | ICD-10-CM | POA: Diagnosis not present

## 2022-11-29 DIAGNOSIS — G9340 Encephalopathy, unspecified: Secondary | ICD-10-CM | POA: Diagnosis not present

## 2022-11-29 LAB — CBC
HCT: 44.8 % (ref 39.0–52.0)
Hemoglobin: 14.9 g/dL (ref 13.0–17.0)
MCH: 31.7 pg (ref 26.0–34.0)
MCHC: 33.3 g/dL (ref 30.0–36.0)
MCV: 95.3 fL (ref 80.0–100.0)
Platelets: 98 10*3/uL — ABNORMAL LOW (ref 150–400)
RBC: 4.7 MIL/uL (ref 4.22–5.81)
RDW: 15.4 % (ref 11.5–15.5)
WBC: 12.1 10*3/uL — ABNORMAL HIGH (ref 4.0–10.5)
nRBC: 0 % (ref 0.0–0.2)

## 2022-11-29 LAB — GLUCOSE, CAPILLARY
Glucose-Capillary: 171 mg/dL — ABNORMAL HIGH (ref 70–99)
Glucose-Capillary: 65 mg/dL — ABNORMAL LOW (ref 70–99)
Glucose-Capillary: 93 mg/dL (ref 70–99)
Glucose-Capillary: 91 mg/dL (ref 70–99)
Glucose-Capillary: 99 mg/dL (ref 70–99)

## 2022-11-29 LAB — HEPARIN LEVEL (UNFRACTIONATED)
Heparin Unfractionated: 0.22 [IU]/mL — ABNORMAL LOW (ref 0.30–0.70)
Heparin Unfractionated: 0.3 [IU]/mL (ref 0.30–0.70)

## 2022-11-29 LAB — CULTURE, BLOOD (ROUTINE X 2)
Culture: NO GROWTH
Special Requests: ADEQUATE

## 2022-11-29 LAB — APTT: aPTT: 43 s — ABNORMAL HIGH (ref 24–36)

## 2022-11-29 MED ORDER — FUROSEMIDE 10 MG/ML IJ SOLN
40.0000 mg | Freq: Once | INTRAMUSCULAR | Status: AC
  Administered 2022-11-29: 40 mg via INTRAVENOUS
  Filled 2022-11-29: qty 4

## 2022-11-29 MED ORDER — THIAMINE MONONITRATE 100 MG PO TABS
100.0000 mg | ORAL_TABLET | Freq: Every day | ORAL | Status: AC
  Administered 2022-11-30 – 2022-12-01 (×2): 100 mg via ORAL
  Filled 2022-11-29 (×2): qty 1

## 2022-11-29 MED ORDER — PREDNISONE 5 MG PO TABS: 10.0000 mg | ORAL_TABLET | Freq: Every day | ORAL | Status: DC

## 2022-11-29 MED ORDER — QUETIAPINE FUMARATE 25 MG PO TABS
25.0000 mg | ORAL_TABLET | Freq: Two times a day (BID) | ORAL | Status: DC
  Administered 2022-11-29 – 2022-12-04 (×11): 25 mg via ORAL
  Filled 2022-11-29 (×11): qty 1

## 2022-11-29 NOTE — Progress Notes (Signed)
Speech Language Pathology Treatment: Dysphagia  Patient Details Name: Robert Carpenter MRN: [id number] DOB: 1933-05-08 Today's Date: 11/29/2022 Time: 0820-0830 SLP Time Calculation (min) (ACUTE ONLY): 10 min  Assessment / Plan / Recommendation Clinical Impression  Pt much calmer today, RN reports pt has tolerated diet with minimal intake. Does seem to struggle with thinner textures. Pt took oral meds in pudding yesterday. SLP observed pt to be calm at baseline, able to follow simple commands for total assisted feeding. Pt took straw sips of nectar thick liquids, bites of puree. When offered thinner, cold ensure, pt did have delayed coughing. Recommend pt continue a puree/nectar diet with MBS in the coming days/week for instrumental assessment and appropriate diet moving forward.    HPI HPI: 89 year old man who is critically ill due to acute hypoxic and hypercarbic respiratory failure requiring NIV due to COVID pneumonia.   He is SNF-bound at baseline, HFrEF and chronic Afib. He has baseline disorientation but is normally communicative. Pt has a history of cardiac arrest, trach and PEG to recover, now removed. Has has several MBS, the last on 01/10/22 that showed mild residue that clears with a second swallow, no penetration or aspiration. He has had recurrent aspiration pna however. Recommended to consume Mech soft/thin liquids due to impairment in mastication.      SLP Plan  Continue with current plan of care;MBS      Recommendations for follow up therapy are one component of a multi-disciplinary discharge planning process, led by the attending physician.  Recommendations may be updated based on patient status, additional functional criteria and insurance authorization.    Recommendations  Diet recommendations: Nectar-thick liquid;Dysphagia 1 (puree) Liquids provided via: Straw;Cup Medication Administration: Crushed with puree Supervision: Full supervision/cueing for compensatory  strategies Compensations: Slow rate;Small sips/bites Postural Changes and/or Swallow Maneuvers: Seated upright 90 degrees                  Oral care QID     Dysphagia, unspecified (R13.10)     Continue with current plan of care;MBS     Robert Carpenter, Robert Carpenter  11/29/2022, 9:50 AM

## 2022-11-29 NOTE — Progress Notes (Signed)
PHARMACY - ANTICOAGULATION CONSULT NOTE  Pharmacy Consult for heparin infusion Indication: atrial fibrillation  No Known Allergies  Patient Measurements: Height: 6\' 2"  (188 cm) Weight: 59.4 kg (130 lb 15.3 oz) IBW/kg (Calculated) : 82.2 Heparin Dosing Weight: 59.2 kg  Vital Signs: Temp: 99 F (37.2 C) (11/28 0800) Temp Source: Bladder (11/28 0720) BP: 144/120 (11/28 0800) Pulse Rate: 152 (11/28 0800)  Labs: Recent Labs    11/27/22 0030 11/27/22 0400 11/27/22 0954 11/28/22 0824 11/28/22 0836 11/28/22 1807 11/29/22 0703  HGB  --  12.9*  --   --  14.6  --  14.9  HCT  --  38.8*  --   --  43.0  --  44.8  PLT  --  107*  --   --   --   --  98*  APTT 108*  --   --  60*  --   --  43*  HEPARINUNFRC 0.62  --  0.61 0.35  --  0.47 0.22*  CREATININE  --   --  0.93 0.98  --   --   --     Estimated Creatinine Clearance: 44.6 mL/min (by C-G formula based on SCr of 0.98 mg/dL).  Assessment: 89yoM admitted for encephalopathy presumed secondary to COVID pneumonia from nursing facility. Was on apixaban prior to admission, with last dose based on facility [facility] on 11/22 at 2100. Pharmacy consulted to dose IV Heparin therapy.   Heparin level now sub-therapeutic at 0.22 (has been therapeutic). aPTT down to 43.  No issues with infusion or site. H/H stable. Platelets trending 98.  SCr 0.98- improving. Will adjust up conservatively.   Goal of Therapy:  Heparin level 0.3-0.7 units/ml Monitor platelets by anticoagulation protocol: Yes   Plan:  Increase  heparin infusion at 800 units/hr  Daily heparin level and CBC  Follow-up ability to resume apixaban therapy  Link Snuffer, PharmD, BCPS, BCCCP Please refer to [facility] for [facility] Pharmacy numbers 11/29/2022, 9:55 AM

## 2022-11-29 NOTE — Progress Notes (Addendum)
PHARMACY - ANTICOAGULATION CONSULT NOTE  Pharmacy Consult for heparin infusion Indication: atrial fibrillation  No Known Allergies  Patient Measurements: Height: 6\' 2"  (188 cm) Weight: 59.4 kg (130 lb 15.3 oz) IBW/kg (Calculated) : 82.2 Heparin Dosing Weight: 59.2 kg  Vital Signs: Temp: 98 F (36.7 C) (11/28 1448) Temp Source: Axillary (11/28 1448) BP: 134/84 (11/28 1448) Pulse Rate: 107 (11/28 1448)  Labs: Recent Labs    11/27/22 0030 11/27/22 0030 11/27/22 0400 11/27/22 0954 11/28/22 0824 11/28/22 0836 11/28/22 1807 11/29/22 0703 11/29/22 1736  HGB  --    < > 12.9*  --   --  14.6  --  14.9  --   HCT  --   --  38.8*  --   --  43.0  --  44.8  --   PLT  --   --  107*  --   --   --   --  98*  --   APTT 108*  --   --   --  60*  --   --  43*  --   HEPARINUNFRC 0.62  --   --  0.61 0.35  --  0.47 0.22* 0.30  CREATININE  --   --   --  0.93 0.98  --   --   --   --    < > = values in this interval not displayed.    Estimated Creatinine Clearance: 44.6 mL/min (by C-G formula based on SCr of 0.98 mg/dL).  Assessment: 89yoM admitted for encephalopathy presumed secondary to COVID pneumonia from nursing facility. Was on apixaban prior to admission, with last dose based on facility [facility] on 11/22 at 2100. Pharmacy consulted to dose IV Heparin therapy.   HL 0.30 - therapeutic, on low end  Goal of Therapy:  Heparin level 0.3-0.7 units/ml Monitor platelets by anticoagulation protocol: Yes   Plan:  Increase heparin infusion slightly to 850 units/hr to maintain therapeutic range F/u HL in AM  Daily heparin level and CBC  Follow-up ability to resume apixaban therapy  Calton Dach, PharmD, BCCCP Clinical Pharmacist 11/29/2022 6:01 PM

## 2022-11-29 NOTE — Progress Notes (Signed)
NAME:  Robert Carpenter, MRN:  [id number], DOB:  September 30, 1933, LOS: 5 ADMISSION DATE:  11/24/2022, CONSULTATION DATE: 11/24 REFERRING MD: Dr. Lazarus Salines, CHIEF COMPLAINT: COVID-19 pneumonia, respiratory failure  History of Present Illness:  Patient is encephalopathic and/or intubated; therefore, history has been obtained from chart review.  89 year old male with past medical history as below, which is significant for chronic atrial fibrillation, cardiac arrest, hypertension, dysphagia, aspiration pneumonia and HFrEF.  At baseline he resides in skilled nursing facility and does have some baseline disorientation.  Confusion seems to be worse at night.  Overall course initially began to deteriorate a few years ago after severe COVID illness which required prolonged intubation and was complicated by cardiac arrest.  He underwent trach and PEG at that time both of which have been taken down.  More recently has been struggling with dysphagia and as result has not been eating well and has been losing weight.  He also struggles with recurrent pneumonia likely secondary to aspiration.  On 11/24 the patient was noted to have worsening encephalopathy complicated by agitation at the nursing facility.  Upon arrival to the emergency department he was fortunately conversant.  Laboratory evaluation was significant for hyperkalemia and respiratory acidosis.  Temporizing medications were given and the patient was placed on BiPAP.  Initial VBG did improve.  However, the patient struggled to keep BiPAP on and was growing increasingly agitated despite multiple sedative doses.  COVID PCR resulted as positive.  He was admitted to the hospital service and started on COVID treatment with dexamethasone and remdesivir.  Unfortunately he was unable to tolerate BiPAP due to agitation and ultimately became somnolent secondary to sedative medications plus acidosis.  PCCM was consulted for ICU transfer and possible intubation.  Pertinent   Medical History   Past Medical History:  Diagnosis Date   Acute on chronic respiratory failure with hypoxia (HCC)    Cardiac arrest [facility])    Cardiac arrest (HCC)    Chronic atrial fibrillation (HCC)    COVID-19 virus infection    Hypertension     Significant Hospital Events: Including procedures, antibiotic start and stop dates in addition to other pertinent events   11/24 presented from SNF with altered mental status, COVID positive.  Hypercapnia seen on ABG.  Initially did not tolerate BiPAP due to agitation however reattempted this a.m. and currently compliant 11/24 Doing better on NIV since switched to AVAPS 11/25 swapped back from AVAPS due to low Vt 11/26 interventional radiology placed core track 11/27 patient removed core track overnight with concerns for aspiration event during dislodgment  Interim History / Subjective:  No overnight issues, remains on 2 L nasal cannula oxygen Came off of vasopressor support overnight  Objective   Blood pressure (!) 144/120, pulse (!) 152, temperature 99 F (37.2 C), resp. rate (!) 27, height 6\' 2"  (1.88 m), weight 59.4 kg, SpO2 97%.        Intake/Output Summary (Last 24 hours) at 11/29/2022 0929 Last data filed at 11/29/2022 0900 Gross per 24 hour  Intake 1741.14 ml  Output 515 ml  Net 1226.14 ml   Filed Weights   11/25/22 1100 11/28/22 0500 11/29/22 0457  Weight: 59.2 kg 59.2 kg 59.4 kg    Examination: General: Acute on chronically ill-appearing elderly male, lying on the bed HEENT: [place]/AT, eyes anicteric.  moist mucus membranes Neuro: Opens eyes with vocal stimuli, following simple commands Chest: Coarse breath sounds, no wheezes or rhonchi Heart: Regular rate and rhythm, no murmurs or gallops Abdomen:  Soft, nontender, nondistended, bowel sounds present Skin: No rash  Labs and images reviewed  Resolved problems:  AKI Lactic acidosis  Assessment & Plan:  Acute respiratory failure with hypoxia and hypercapnia Septic  shock due to COVID-19 pneumonia Acute metabolic encephalopathy in the setting of hypercapnia Aspiration pneumonia Patient received 3 days of remdesivir and completed 5-day course of ceftriaxone for possible aspiration Transitioned off of BiPAP, currently on 2 L nasal cannula oxygen Came off of vasopressor support Continue aspirin continue precautions Continue dysphagia diet with thickened liquid  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) HTN Atrial flutter with RVR Demand-related ischemia Monitor intake and output Not able to tolerate GDMT due to borderline low blood pressure Remain in atrial flutter with heart rate ranging between 100-115 Continuous telemetry  Continue IV heparin infusion for stroke prophylaxis Will give Lasix 40 mg x 1  Severe malnutrition due to longstanding dysphagia Core track dislodged SLP evaluated the patient, recommend dysphagia diet 1  Thrombocytopenia of critical illness Platelet count remain low but is stable Watch for signs of bleeding  Acute urinary retention Continue bethanechol Voiding trial tomorrow  Best Practice (right click and "Reselect all SmartList Selections" daily)   Diet/type: Dysphagia diet 1 DVT prophylaxis: IV heparin infusion  Pressure ulcer(s): N/A GI prophylaxis: PPI Lines: N/A Foley: In place and is still required.  Voiding trial by tomorrow Code Status:  full code Last date of multidisciplinary goals of care discussion: Called and spoke with [name] over the phone morning of 11/20 7 see separate IPAL note     Cheri Fowler, MD [facility] See Amion for pager If no response to pager, please call [phone] until 7pm After 7pm, Please call E-link [phone]

## 2022-11-30 DIAGNOSIS — G9340 Encephalopathy, unspecified: Secondary | ICD-10-CM | POA: Diagnosis not present

## 2022-11-30 DIAGNOSIS — J1282 Pneumonia due to coronavirus disease 2019: Secondary | ICD-10-CM | POA: Diagnosis not present

## 2022-11-30 DIAGNOSIS — J9601 Acute respiratory failure with hypoxia: Secondary | ICD-10-CM | POA: Diagnosis not present

## 2022-11-30 DIAGNOSIS — U071 COVID-19: Secondary | ICD-10-CM | POA: Diagnosis not present

## 2022-11-30 LAB — MAGNESIUM: Magnesium: 1.9 mg/dL (ref 1.7–2.4)

## 2022-11-30 LAB — BASIC METABOLIC PANEL
Anion gap: 9 (ref 5–15)
BUN: 20 mg/dL (ref 8–23)
CO2: 31 mmol/L (ref 22–32)
Calcium: 8.7 mg/dL — ABNORMAL LOW (ref 8.9–10.3)
Chloride: 100 mmol/L (ref 98–111)
Creatinine, Ser: 1.06 mg/dL (ref 0.61–1.24)
GFR, Estimated: >60 mL/min (ref >60)
Glucose, Bld: 109 mg/dL — ABNORMAL HIGH (ref 70–99)
Potassium: 4.6 mmol/L (ref 3.5–5.1)
Sodium: 140 mmol/L (ref 135–145)

## 2022-11-30 LAB — CBC
HCT: 44 % (ref 39.0–52.0)
Hemoglobin: 14.5 g/dL (ref 13.0–17.0)
MCH: 30.9 pg (ref 26.0–34.0)
MCHC: 33 g/dL (ref 30.0–36.0)
MCV: 93.6 fL (ref 80.0–100.0)
Platelets: 131 10*3/uL — ABNORMAL LOW (ref 150–400)
RBC: 4.7 MIL/uL (ref 4.22–5.81)
RDW: 15.1 % (ref 11.5–15.5)
WBC: 9.3 10*3/uL (ref 4.0–10.5)
nRBC: 0 % (ref 0.0–0.2)

## 2022-11-30 LAB — GLUCOSE, CAPILLARY
Glucose-Capillary: 107 mg/dL — ABNORMAL HIGH (ref 70–99)
Glucose-Capillary: 114 mg/dL — ABNORMAL HIGH (ref 70–99)
Glucose-Capillary: 116 mg/dL — ABNORMAL HIGH (ref 70–99)
Glucose-Capillary: 132 mg/dL — ABNORMAL HIGH (ref 70–99)
Glucose-Capillary: 157 mg/dL — ABNORMAL HIGH (ref 70–99)
Glucose-Capillary: 86 mg/dL (ref 70–99)

## 2022-11-30 LAB — PHOSPHORUS: Phosphorus: 2.4 mg/dL — ABNORMAL LOW (ref 2.5–4.6)

## 2022-11-30 LAB — HEPARIN LEVEL (UNFRACTIONATED): Heparin Unfractionated: 0.34 [IU]/mL (ref 0.30–0.70)

## 2022-11-30 LAB — CULTURE, BLOOD (ROUTINE X 2)
Culture: NO GROWTH
Special Requests: ADEQUATE

## 2022-11-30 MED ORDER — TAMSULOSIN HCL 0.4 MG PO CAPS
0.4000 mg | ORAL_CAPSULE | Freq: Every day | ORAL | Status: DC
  Administered 2022-11-30 – 2022-12-04 (×5): 0.4 mg via ORAL
  Filled 2022-11-30 (×5): qty 1

## 2022-11-30 MED ORDER — METOPROLOL TARTRATE 25 MG PO TABS
25.0000 mg | ORAL_TABLET | Freq: Two times a day (BID) | ORAL | Status: DC
  Administered 2022-11-30 – 2022-12-01 (×4): 25 mg via ORAL
  Filled 2022-11-30 (×4): qty 1

## 2022-11-30 MED ORDER — APIXABAN 2.5 MG PO TABS
2.5000 mg | ORAL_TABLET | Freq: Two times a day (BID) | ORAL | Status: DC
  Administered 2022-11-30 – 2022-12-04 (×9): 2.5 mg via ORAL
  Filled 2022-11-30 (×9): qty 1

## 2022-11-30 NOTE — Evaluation (Signed)
Physical Therapy Evaluation Patient Details Name: Robert Carpenter MRN: [id number] DOB: [birth year] Today's Date: 11/30/2022  History of Present Illness  89 y.o. male presents to [facility] hospital on 11/24/2022 with AMS, found to be COVID positive and with respiratory acidosis. Pt requiring BiPAP. PMH includes acute on chronic respiratory failure, cardiac arrest, afib, HTN, dementia.  Clinical Impression  Pt presents with admitting diagnosis above. Co-treat with OT. Pt today was able to stand and take sidestep along EOB with +2 Mod A. Distance limited to EOB only due to IV line hung from the end of the bed. Pt was a poor historian however per chart review it appears that he came from a SNF. Recommend pt return upon DC. PT will continue to follow.       If plan is discharge home, recommend the following: Two people to help with walking and/or transfers;Assistance with cooking/housework;A little help with bathing/dressing/bathroom;Assistance with feeding;Direct supervision/assist for medications management;Direct supervision/assist for financial management;Assist for transportation;Help with stairs or ramp for entrance;Supervision due to cognitive status   Can travel by private vehicle   No    Equipment Recommendations None recommended by PT  Recommendations for Other Services       Functional Status Assessment Patient has had a recent decline in their functional status and demonstrates the ability to make significant improvements in function in a reasonable and predictable amount of time.     Precautions / Restrictions Precautions Precautions: Fall Precaution Comments: B hand mitts, waist restraint in bed Restrictions Weight Bearing Restrictions: No      Mobility  Bed Mobility Overal bed mobility: Needs Assistance Bed Mobility: Supine to Sit, Sit to Supine     Supine to sit: Mod assist Sit to supine: Mod assist   General bed mobility comments: mod A for in/out of bed.     Transfers Overall transfer level: Needs assistance Equipment used: Rolling walker (2 wheels) Transfers: Sit to/from Stand Sit to Stand: Mod assist, +2 physical assistance           General transfer comment: mod A x2 for safety with STS, posterior lean against bed, difficulty standing up straight.    Ambulation/Gait               General Gait Details: Limited to sidesteps due to IV placed on end of bed  Stairs            Wheelchair Mobility     Tilt Bed    Modified Rankin (Stroke Patients Only)       Balance Overall balance assessment: Needs assistance Sitting-balance support: No upper extremity supported, Feet supported Sitting balance-Leahy Scale: Fair Sitting balance - Comments: EOB ADLs   Standing balance support: Bilateral upper extremity supported, During functional activity, Reliant on assistive device for balance Standing balance-Leahy Scale: Poor Standing balance comment: posterior lean against bed, difficulty standing up straight.                             Pertinent Vitals/Pain Pain Assessment Pain Assessment: Faces Faces Pain Scale: No hurt Pain Intervention(s): Monitored during session    Home Living Family/patient expects to be discharged to:: Skilled nursing facility                   Additional Comments: from [facility]    Prior Function Prior Level of Function : Patient poor historian/Family not available  Extremity/Trunk Assessment   Upper Extremity Assessment Upper Extremity Assessment: Generalized weakness    Lower Extremity Assessment Lower Extremity Assessment: Generalized weakness    Cervical / Trunk Assessment Cervical / Trunk Assessment: Kyphotic  Communication   Communication Communication: Difficulty communicating thoughts/reduced clarity of speech  Cognition Arousal: Alert Behavior During Therapy: Flat affect Overall Cognitive Status: History of  cognitive impairments - at baseline                                 General Comments: able to state name/birthdate, decreased clarity of speech prevents further verbal communication        General Comments General comments (skin integrity, edema, etc.): VSS on RA    Exercises     Assessment/Plan    PT Assessment Patient needs continued PT services  PT Problem List Decreased strength;Decreased range of motion;Decreased activity tolerance;Decreased balance;Decreased mobility;Decreased coordination;Decreased knowledge of use of DME;Decreased safety awareness;Decreased knowledge of precautions;Cardiopulmonary status limiting activity;Decreased cognition       PT Treatment Interventions DME instruction;Gait training;Functional mobility training;Stair training;Therapeutic activities;Balance training;Therapeutic exercise;Neuromuscular re-education;Cognitive remediation;Patient/family education    PT Goals (Current goals can be found in the Care Plan section)  Acute Rehab PT Goals PT Goal Formulation: Patient unable to participate in goal setting Time For Goal Achievement: 12/14/22 Potential to Achieve Goals: Fair    Frequency Min 1X/week     Co-evaluation               AM-PAC PT "6 Clicks" Mobility  Outcome Measure Help needed turning from your back to your side while in a flat bed without using bedrails?: A Little Help needed moving from lying on your back to sitting on the side of a flat bed without using bedrails?: A Lot Help needed moving to and from a bed to a chair (including a wheelchair)?: A Lot Help needed standing up from a chair using your arms (e.g., wheelchair or bedside chair)?: A Lot Help needed to walk in hospital room?: Total Help needed climbing 3-5 steps with a railing? : Total 6 Click Score: 11    End of Session Equipment Utilized During Treatment: Gait belt Activity Tolerance: Patient tolerated treatment well Patient left: in bed;with  call bell/phone within reach;with bed alarm set;with restraints reapplied Nurse Communication: Mobility status PT Visit Diagnosis: Other abnormalities of gait and mobility (R26.89)    Time: 2841-3244 PT Time Calculation (min) (ACUTE ONLY): 27 min   Charges:   PT Evaluation $PT Eval Moderate Complexity: 1 Mod   PT General Charges $$ ACUTE PT VISIT: 1 Visit         Shela Nevin, PT, DPT Acute Rehab Services [phone]   Gladys Damme 11/30/2022, 4:26 PM

## 2022-11-30 NOTE — TOC Initial Note (Addendum)
Transition of Care [facility]) - Initial/Assessment Note    Patient Details  Name: Robert Carpenter MRN: [id number] Date of Birth: 1933-02-20  Transition of Care [facility]) CM/SW Contact:    Robert Latin, LCSW Phone Number: 11/30/2022, 2:14 PM  Clinical Narrative:                 Patient admitted from [facility] where he is a long term care resident. CSW left voicemail for patient' son.   Son responded and reported agreement with plan.   Expected Discharge Plan: Skilled Nursing Facility Barriers to Discharge: Continued Medical Work up   Patient Goals and CMS Choice            Expected Discharge Plan and Services In-house Referral: Clinical Social Work   Post Acute Care Choice: Skilled Nursing Facility Living arrangements for the past 2 months: Skilled Nursing Facility                                      Prior Living Arrangements/Services Living arrangements for the past 2 months: Skilled Nursing Facility Lives with:: Facility Resident Patient language and need for interpreter reviewed:: Yes Do you feel safe going back to the place where you live?: Yes      Need for Family Participation in Patient Care: Yes (Comment) Care giver support system in place?: Yes (comment)   Criminal Activity/Legal Involvement Pertinent to Current Situation/Hospitalization: No - Comment as needed  Activities of Daily Living      Permission Sought/Granted Permission sought to share information with : Facility Medical sales representative, Family Supports Permission granted to share information with : No  Share Information with NAME: Robert Carpenter  Permission granted to share info w AGENCY: [employer] granted to share info w Relationship: Son  Permission granted to share info w Contact Information: [phone]  Emotional Assessment Appearance:: Appears stated age Attitude/Demeanor/Rapport: Unable to Assess Affect (typically observed): Unable to Assess Orientation:  : Oriented to Self Alcohol / Substance Use: Not Applicable Psych Involvement: No (comment)  Admission diagnosis:  Sinus tachycardia [R00.0] Altered mental status, unspecified altered mental status type [R41.82] Pneumonia due to COVID-19 virus [U07.1, J12.82] COVID-19 [U07.1] Patient Active Problem List   Diagnosis Date Noted   Protein-calorie malnutrition, severe 11/26/2022   Hypoxic respiratory failure (HCC) 11/25/2022   Hypercarbia 11/25/2022   Atrial flutter with rapid ventricular response (HCC) 11/25/2022   Acute encephalopathy 11/25/2022   Neurocognitive deficits 11/25/2022   Interstitial lung disease (HCC) 11/25/2022   Altered mental status 11/25/2022   Pneumonia due to COVID-19 virus 11/24/2022   Thyroid nodule 01/18/2021   Pulmonary edema 01/18/2021   Chronic systolic CHF (congestive heart failure) (HCC) 01/18/2021   Acute respiratory failure with hypoxia and hypercapnia (HCC) 01/18/2021   HTN (hypertension) 01/13/2021   Chronic atrial fibrillation (HCC)    COVID-19 virus infection    History of DVT (deep vein thrombosis) 02/19/2020   History of cardiac arrest 02/19/2020   PCP:  Robert Alken, MD Pharmacy:  No Pharmacies Listed    Social Determinants of Health (SDOH) Social History: SDOH Screenings   Food Insecurity: Patient Unable To Answer (11/27/2022)  Housing: Patient Unable To Answer (11/27/2022)  Transportation Needs: Patient Unable To Answer (11/27/2022)  Utilities: Patient Unable To Answer (11/27/2022)  Financial Resource Strain: Low Risk  (12/23/2019)   Received [facility]  Tobacco Use: Medium Risk (11/24/2022)  SDOH Interventions:     Readmission Risk Interventions     No data to display

## 2022-11-30 NOTE — Progress Notes (Addendum)
Nutrition Follow-up  DOCUMENTATION CODES:   Underweight, Severe malnutrition in context of chronic illness  INTERVENTION:   Magic cup TID with meals, each supplement provides 290 kcal and 9 grams of protein  If continued full scope of care, recommend PEG placement for nutrition support.   NUTRITION DIAGNOSIS:   Severe Malnutrition related to chronic illness (chronic dysphagia, CHF, interstitial lung disease) as evidenced by severe fat depletion, severe muscle depletion.  Ongoing   GOAL:   Patient will meet greater than or equal to 90% of their needs  Unmet   MONITOR:   Diet advancement, Labs, Weight trends, TF tolerance  REASON FOR ASSESSMENT:   Consult Enteral/tube feeding initiation and management  ASSESSMENT:   89 year old male who presented to the ED from SNF on 11/23 with AMS. PMH of atrial fibrillation, HTN, DVT, CHF, interstitial lung disease, dysphagia, aspiration pneumonia, progressive cognitive decline and suspected undiagnosed dementia, prior cardiac arrest, prolonged intubation requiring trach and PEG which have now been d/c. Pt admitted with acute respiratory failure with hypoxia and hypercarbia due to COVID-19 pneumonia requiring BiPAP.  Patient confused during RD visit. Lunch tray was delivered to patient by RD. He was in wrist restraints and requires nursing assistance with feeding.   Small bore feeding tube was placed by radiology / fluoroscopy 11/26, but it was accidentally dislodged that night, so patient received less than 7 hours of tube feeding total.      Currently on a dysphagia 1 diet with honey thick liquids. SLP following. Meal intakes 15-40%.   CCM note 11/27 after meeting with family states that family and patient would want to continue all aggressive interventions including potential replacement of PEG tube if unable to safely consume oral diet. Current diet is restricted in textures (pureed) and beverage consistencies (honey thick) which  increases chance of inadequate intake of fluids and foods. Supplement options are also limited. Patient is currently not able to meet nutrition needs with PO diet.   Labs reviewed. Phos 2.4 CBG: 114-157  Medications reviewed and include decadron, flomax, thiamine.  Admit weight 59.2 kg Current weight 59.4 kg  Underweight with BMI=16.8 Severely malnourished with severe depletion of muscle and subcutaneous fat mass.  Diet Order:   Diet Order             DIET - DYS 1 Fluid consistency: Honey Thick  Diet effective now                   EDUCATION NEEDS:   Not appropriate for education at this time  Skin:  Skin Assessment: Reviewed RN Assessment  Last BM:  11/29 type 6  Height:   Ht Readings from Last 1 Encounters:  11/25/22 6\' 2"  (1.88 m)    Weight:   Wt Readings from Last 1 Encounters:  11/29/22 59.4 kg    BMI:  Body mass index is 16.81 kg/m.  Estimated Nutritional Needs:   Kcal:  1800-2000  Protein:  80-95 grams  Fluid:  >1.8 L   Gabriel Rainwater RD, LDN, CNSC Please refer to Amion for contact information.

## 2022-11-30 NOTE — Progress Notes (Addendum)
Speech Language Pathology Treatment: Dysphagia  Patient Details Name: Robert Carpenter MRN: [id number] DOB: September 16, 1933 Today's Date: 11/30/2022 Time: 0865-7846 SLP Time Calculation (min) (ACUTE ONLY): 18 min  Assessment / Plan / Recommendation Clinical Impression  Pt seemingly more alert this date compared to prior sessions. Upon SLP arrival, pt had removed his safety mitts entirely and was laying calmly in bed. Reapplied at end of session. He participated in providing his own oral care and self-feeding with Min assist. His RR remained steadily between 20-25 throughout all trials. Observed pt with honey thick liquids and purees with intermittent delayed coughing. Pt seems to be improving, but will continue to monitor participation and respiratory status to assess readiness to participate in MBS next week. Will f/u to complete as scheduling allows.    HPI HPI: 89 year old man who is critically ill due to acute hypoxic and hypercarbic respiratory failure requiring NIV due to COVID pneumonia.   He is SNF-bound at baseline, HFrEF and chronic Afib. He has baseline disorientation but is normally communicative. Pt has a history of cardiac arrest, trach and PEG to recover, now removed. Has has several MBS, the last on 01/10/22 that showed mild residue that clears with a second swallow, no penetration or aspiration. He has had recurrent aspiration pna however. Recommended to consume Mech soft/thin liquids due to impairment in mastication.      SLP Plan  Continue with current plan of care      Recommendations for follow up therapy are one component of a multi-disciplinary discharge planning process, led by the attending physician.  Recommendations may be updated based on patient status, additional functional criteria and insurance authorization.    Recommendations  Diet recommendations: Dysphagia 1 (puree);Honey-thick liquid Liquids provided via: Straw;Cup Medication Administration: Crushed with  puree Supervision: Full supervision/cueing for compensatory strategies Compensations: Slow rate;Small sips/bites Postural Changes and/or Swallow Maneuvers: Seated upright 90 degrees                  Oral care QID   Frequent or constant Supervision/Assistance Dysphagia, unspecified (R13.10)     Continue with current plan of care     Gwynneth Aliment, M.A., CF-SLP Speech Language Pathology, Acute Rehabilitation Services  Secure Chat preferred [phone]   11/30/2022, 11:44 AM

## 2022-11-30 NOTE — Evaluation (Signed)
Occupational Therapy Evaluation Patient Details Name: Robert Carpenter MRN: [id number] DOB: 1933-04-05 Today's Date: 11/30/2022   History of Present Illness 89 y.o. male presents to [facility] hospital on 11/24/2022 with AMS, found to be COVID positive and with respiratory acidosis. Pt requiring BiPAP. PMH includes acute on chronic respiratory failure, cardiac arrest, afib, HTN, dementia.   Clinical Impression   Pt able to state name/birthdate upon arrival, reduced clarity of speech limited communication. Co-treat with PT. Pt wearing waist restraint in bed with B mitts upon arrive, reapplied when leaving room. Pt from SNF, poor historian. Pt currently requires mod A for bed mobility, mod A x2 for transfers, and mod A for ADLs at EOB. Pt has poor standing balance and displays decreased ability to complete problem solving. Pt able to follow commands fairly consistently. Pt would benefit from continued acute OT to maximize progress as able, return to postacute rehab, <3hrs/day appropriate.       If plan is discharge home, recommend the following: Two people to help with walking and/or transfers;A lot of help with bathing/dressing/bathroom;Assistance with cooking/housework;Direct supervision/assist for medications management;Assist for transportation    Functional Status Assessment  Patient has had a recent decline in their functional status and demonstrates the ability to make significant improvements in function in a reasonable and predictable amount of time.  Equipment Recommendations  None recommended by OT    Recommendations for Other Services       Precautions / Restrictions Precautions Precautions: Fall Precaution Comments: B hand mitts, waist restraint in bed Restrictions Weight Bearing Restrictions: No      Mobility Bed Mobility Overal bed mobility: Needs Assistance Bed Mobility: Supine to Sit, Sit to Supine     Supine to sit: Mod assist Sit to supine: Mod assist   General bed  mobility comments: mod A for in/out of bed.    Transfers Overall transfer level: Needs assistance Equipment used: Rolling walker (2 wheels) Transfers: Sit to/from Stand Sit to Stand: Mod assist, +2 physical assistance           General transfer comment: mod A x2 for safety with STS, posterior lean against bed, difficulty standing up straight.      Balance Overall balance assessment: Needs assistance Sitting-balance support: No upper extremity supported, Feet supported Sitting balance-Leahy Scale: Fair Sitting balance - Comments: EOB ADLs   Standing balance support: Bilateral upper extremity supported, During functional activity, Reliant on assistive device for balance Standing balance-Leahy Scale: Poor Standing balance comment: posterior lean against bed, difficulty standing up straight.                           ADL either performed or assessed with clinical judgement   ADL Overall ADL's : Needs assistance/impaired     Grooming: Moderate assistance;Sitting   Upper Body Bathing: Moderate assistance;Sitting   Lower Body Bathing: Moderate assistance;Sitting/lateral leans   Upper Body Dressing : Moderate assistance;Sitting   Lower Body Dressing: Moderate assistance;Sitting/lateral leans   Toilet Transfer: Moderate assistance;+2 for physical assistance;Rolling walker (2 wheels);BSC/3in1   Toileting- Clothing Manipulation and Hygiene: Maximal assistance         General ADL Comments: Pt mod A for most ADLs, generalized weakness, poor problem solving.     Vision         Perception         Praxis         Pertinent Vitals/Pain Pain Assessment Pain Assessment: Faces Faces Pain Scale: No hurt Pain  Intervention(s): Monitored during session     Extremity/Trunk Assessment Upper Extremity Assessment Upper Extremity Assessment: Generalized weakness;Difficult to assess due to impaired cognition   Lower Extremity Assessment Lower Extremity  Assessment: Defer to PT evaluation       Communication Communication Communication: Difficulty communicating thoughts/reduced clarity of speech   Cognition Arousal: Alert Behavior During Therapy: Flat affect Overall Cognitive Status: History of cognitive impairments - at baseline                                 General Comments: able to state name/birthdate, decreased clarity of speech prevents further verbal communication     General Comments       Exercises     Shoulder Instructions      Home Living Family/patient expects to be discharged to:: Skilled nursing facility                                 Additional Comments: from [facility]      Prior Functioning/Environment Prior Level of Function : Patient poor historian/Family not available                        OT Problem List: Decreased strength;Decreased activity tolerance;Impaired balance (sitting and/or standing);Decreased cognition;Decreased safety awareness;Decreased knowledge of use of DME or AE;Impaired UE functional use      OT Treatment/Interventions: Self-care/ADL training;Therapeutic exercise;Energy conservation;DME and/or AE instruction;Therapeutic activities;Patient/family education;Balance training    OT Goals(Current goals can be found in the care plan section) Acute Rehab OT Goals Patient Stated Goal: not able to participate in goal setting OT Goal Formulation: Patient unable to participate in goal setting Time For Goal Achievement: 12/14/22 Potential to Achieve Goals: Good  OT Frequency: Min 1X/week    Co-evaluation              AM-PAC OT "6 Clicks" Daily Activity     Outcome Measure Help from another person eating meals?: A Little Help from another person taking care of personal grooming?: A Lot Help from another person toileting, which includes using toliet, bedpan, or urinal?: A Lot Help from another person bathing (including washing,  rinsing, drying)?: A Lot Help from another person to put on and taking off regular upper body clothing?: A Lot Help from another person to put on and taking off regular lower body clothing?: A Lot 6 Click Score: 13   End of Session Equipment Utilized During Treatment: Gait belt;Rolling walker (2 wheels) Nurse Communication: Mobility status  Activity Tolerance: Patient tolerated treatment well Patient left: in bed;with call bell/phone within reach;with bed alarm set  OT Visit Diagnosis: Unsteadiness on feet (R26.81);Other abnormalities of gait and mobility (R26.89);Muscle weakness (generalized) (M62.81);Other symptoms and signs involving cognitive function                Time: 1445-1511 OT Time Calculation (min): 26 min Charges:  OT General Charges $OT Visit: 1 Visit OT Evaluation $OT Eval Moderate Complexity: 1 [address], OTR/L   Robert Carpenter 11/30/2022, 3:41 PM

## 2022-11-30 NOTE — Progress Notes (Addendum)
Addendum: Will switch from heparin to apixaban per MD.   PHARMACY - ANTICOAGULATION CONSULT NOTE  Pharmacy Consult for heparin infusion Indication: atrial fibrillation  No Known Allergies  Patient Measurements: Height: 6\' 2"  (188 cm) Weight: 59.4 kg (130 lb 15.3 oz) IBW/kg (Calculated) : 82.2 Heparin Dosing Weight: 59.2 kg  Vital Signs: Temp: 97.8 F (36.6 C) (11/29 0449) Temp Source: Oral (11/29 0449) BP: 168/93 (11/29 0449) Pulse Rate: 103 (11/29 0000)  Labs: Recent Labs     0000 11/27/22 0954 11/28/22 0824 11/28/22 0836 11/28/22 1807 11/29/22 0703 11/29/22 1736 11/30/22 0450  HGB   < >  --   --  14.6  --  14.9  --  14.5  HCT  --   --   --  43.0  --  44.8  --  44.0  PLT  --   --   --   --   --  98*  --  131*  APTT  --   --  60*  --   --  43*  --   --   HEPARINUNFRC  --  0.61 0.35  --    < > 0.22* 0.30 0.34  CREATININE  --  0.93 0.98  --   --   --   --  1.06   < > = values in this interval not displayed.    Estimated Creatinine Clearance: 41.3 mL/min (by C-G formula based on SCr of 1.06 mg/dL).  Assessment: 89yoM admitted for encephalopathy presumed secondary to COVID pneumonia from nursing facility. Was on apixaban prior to admission, with last dose based on facility [facility] on 11/22 at 2100. Pharmacy consulted to dose IV Heparin therapy. Patient now taking some meds orally and crushed. Will follow up to transition back to PO apixaban when oral intake more stable.   HL 0.34 - therapeutic, CBC stable.   Goal of Therapy:  Heparin level 0.3-0.7 units/ml Monitor platelets by anticoagulation protocol: Yes   Plan:  Continue heparin at 850 units/hr  F/u HL in AM  Daily heparin level and CBC  Follow-up ability to resume apixaban therapy  Blane Ohara, PharmD, BCPS PGY2 Pharmacy Resident

## 2022-11-30 NOTE — Plan of Care (Signed)
  Problem: Education: Goal: Knowledge of risk factors and measures for prevention of condition will improve Outcome: Progressing   Problem: Coping: Goal: Psychosocial and spiritual needs will be supported Outcome: Progressing   Problem: Respiratory: Goal: Will maintain a patent airway Outcome: Progressing Goal: Complications related to the disease process, condition or treatment will be avoided or minimized Outcome: Progressing   Problem: Education: Goal: Knowledge of General Education information will improve Description: Including pain rating scale, medication(s)/side effects and non-pharmacologic comfort measures Outcome: Progressing   Problem: Health Behavior/Discharge Planning: Goal: Ability to manage health-related needs will improve Outcome: Progressing   Problem: Clinical Measurements: Goal: Ability to maintain clinical measurements within normal limits will improve Outcome: Progressing Goal: Will remain free from infection Outcome: Progressing Goal: Diagnostic test results will improve Outcome: Progressing Goal: Respiratory complications will improve Outcome: Progressing Goal: Cardiovascular complication will be avoided Outcome: Progressing   Problem: Activity: Goal: Risk for activity intolerance will decrease Outcome: Progressing   Problem: Nutrition: Goal: Adequate nutrition will be maintained Outcome: Progressing   Problem: Coping: Goal: Level of anxiety will decrease Outcome: Progressing   Problem: Elimination: Goal: Will not experience complications related to bowel motility Outcome: Progressing Goal: Will not experience complications related to urinary retention Outcome: Progressing   Problem: Pain Management: Goal: General experience of comfort will improve Outcome: Progressing   Problem: Safety: Goal: Ability to remain free from injury will improve Outcome: Progressing   Problem: Skin Integrity: Goal: Risk for impaired skin integrity will  decrease Outcome: Progressing   Problem: Education: Goal: Ability to describe self-care measures that may prevent or decrease complications (Diabetes Survival Skills Education) will improve Outcome: Progressing Goal: Individualized Educational Video(s) Outcome: Progressing   Problem: Coping: Goal: Ability to adjust to condition or change in health will improve Outcome: Progressing   Problem: Fluid Volume: Goal: Ability to maintain a balanced intake and output will improve Outcome: Progressing   Problem: Health Behavior/Discharge Planning: Goal: Ability to identify and utilize available resources and services will improve Outcome: Progressing Goal: Ability to manage health-related needs will improve Outcome: Progressing   Problem: Metabolic: Goal: Ability to maintain appropriate glucose levels will improve Outcome: Progressing   Problem: Nutritional: Goal: Maintenance of adequate nutrition will improve Outcome: Progressing Goal: Progress toward achieving an optimal weight will improve Outcome: Progressing   Problem: Skin Integrity: Goal: Risk for impaired skin integrity will decrease Outcome: Progressing   Problem: Tissue Perfusion: Goal: Adequacy of tissue perfusion will improve Outcome: Progressing   Problem: Safety: Goal: Non-violent Restraint(s) Outcome: Progressing

## 2022-11-30 NOTE — [place] FL2 (Signed)
[place] MEDICAID FL2 LEVEL OF CARE FORM     IDENTIFICATION  Patient Name: Robert Carpenter Birthdate: Mar 15, 1933 Sex: male Admission Date (Current Location): 11/24/2022  [facility] and [place] Number:  Producer, television/film/video and Address:  [facility] [place], [address] [place], [place] [address]      Provider Number: [id number]  Attending Physician Name and Address:  Maretta Bees, MD  Relative Name and Phone Number:       Current Level of Care: Hospital Recommended Level of Care: Skilled Nursing Facility Prior Approval Number:    Date Approved/Denied:   PASRR Number: [id number]  Discharge Plan: SNF    Current Diagnoses: Patient Active Problem List   Diagnosis Date Noted   Protein-calorie malnutrition, severe 11/26/2022   Hypoxic respiratory failure (HCC) 11/25/2022   Hypercarbia 11/25/2022   Atrial flutter with rapid ventricular response (HCC) 11/25/2022   Acute encephalopathy 11/25/2022   Neurocognitive deficits 11/25/2022   Interstitial lung disease (HCC) 11/25/2022   Altered mental status 11/25/2022   Pneumonia due to COVID-19 virus 11/24/2022   Thyroid nodule 01/18/2021   Pulmonary edema 01/18/2021   Chronic systolic CHF (congestive heart failure) (HCC) 01/18/2021   Acute respiratory failure with hypoxia and hypercapnia (HCC) 01/18/2021   HTN (hypertension) 01/13/2021   Chronic atrial fibrillation (HCC)    COVID-19 virus infection    History of DVT (deep vein thrombosis) 02/19/2020   History of cardiac arrest 02/19/2020    Orientation RESPIRATION BLADDER Height & Weight     Self  Normal Incontinent, External catheter Weight: 130 lb 15.3 oz (59.4 kg) Height:  6\' 2"  (188 cm)  BEHAVIORAL SYMPTOMS/MOOD NEUROLOGICAL BOWEL NUTRITION STATUS      Incontinent Diet (See dc summary)  AMBULATORY STATUS COMMUNICATION OF NEEDS Skin   Extensive Assist Verbally Normal                       Personal Care Assistance Level of  Assistance  Bathing, Feeding, Dressing Bathing Assistance: Maximum assistance Feeding assistance: Maximum assistance Dressing Assistance: Maximum assistance     Functional Limitations Info  Sight Sight Info: Impaired        SPECIAL CARE FACTORS FREQUENCY                       Contractures Contractures Info: Not present    Additional Factors Info  Code Status, Allergies, Isolation Precautions, Psychotropic Code Status Info: Full Allergies Info: NKA Psychotropic Info: Zoloft, trazadone   Isolation Precautions Info: COVID+     Current Medications (11/30/2022):  This is the current hospital active medication list Current Facility-Administered Medications  Medication Dose Route Frequency Provider Last Rate Last Admin   albuterol (PROVENTIL) (2.5 MG/3ML) 0.083% nebulizer solution 2.5 mg  2.5 mg Nebulization Q4H PRN Segars, Christiane Ha, MD       apixaban Everlene Balls) tablet 2.5 mg  2.5 mg Oral BID Ghimire, Werner Lean, MD       bethanechol (URECHOLINE) tablet 10 mg  10 mg Oral TID Lynnell Catalan, MD   10 mg at 11/30/22 4010   bisacodyl (DULCOLAX) suppository 10 mg  10 mg Rectal Daily PRN Dolly Rias, MD       Chlorhexidine Gluconate Cloth 2 % PADS 6 each  6 each Topical Daily Lynnell Catalan, MD   6 each at 11/30/22 0924   dexamethasone (DECADRON) injection 6 mg  6 mg Intravenous Q24H Dolly Rias, MD   6 mg at 11/30/22 [phone]  iohexol (OMNIPAQUE) 300 MG/ML solution 50 mL  50 mL Per Tube Once PRN Ashley Royalty, Senaida Ores, PA       labetalol (NORMODYNE) injection 10 mg  10 mg Intravenous Q2H PRN Migdalia Dk, MD       metoprolol tartrate (LOPRESSOR) tablet 25 mg  25 mg Oral BID Maretta Bees, MD   25 mg at 11/30/22 1123   OLANZapine zydis (ZYPREXA) disintegrating tablet 5 mg  5 mg Oral Once Migdalia Dk, MD       Oral care mouth rinse  15 mL Mouth Rinse 4 times per day Lynnell Catalan, MD   15 mL at 11/30/22 1123   Oral care mouth rinse  15 mL Mouth Rinse PRN  Lynnell Catalan, MD       Melene Muller ON 12/05/2022] predniSONE (DELTASONE) tablet 10 mg  10 mg Oral Q breakfast Cheri Fowler, MD       QUEtiapine (SEROQUEL) tablet 25 mg  25 mg Oral BID Cheri Fowler, MD   25 mg at 11/30/22 7829   sertraline (ZOLOFT) tablet 25 mg  25 mg Oral Daily Janeann Forehand D, NP   25 mg at 11/30/22 0924   tamsulosin (FLOMAX) capsule 0.4 mg  0.4 mg Oral Daily Maretta Bees, MD   0.4 mg at 11/30/22 1123   thiamine (VITAMIN B1) tablet 100 mg  100 mg Oral Daily Cheri Fowler, MD   100 mg at 11/30/22 0924   traZODone (DESYREL) tablet 50 mg  50 mg Oral QHS Janeann Forehand D, NP   50 mg at 11/29/22 2046     Discharge Medications: Please see discharge summary for a list of discharge medications.  Relevant Imaging Results:  Relevant Lab Results:   Additional Information SSN: [id number]  [name], LCSW

## 2022-11-30 NOTE — Progress Notes (Addendum)
PROGRESS NOTE        PATIENT DETAILS Name: Robert Carpenter Age: 89 y.o. Sex: male Date of Birth: 1933-02-21 Admit Date: 11/24/2022 Admitting Physician Lynnell Catalan, MD [id number], Florian Buff, MD  Brief Summary: Patient is a 89 y.o.  male with history of prior history of severe COVID infection in 2021 (prolonged vent/cardiac arrest-trach/PEG-s/p decannulation/PEG removal) with residual fibrosis, atrial fibrillation, HFpEF, dementia-presented to the hospital with worsening confusion-Hospital course complicated by worsening hypercapnia-not able to tolerate BiPAP-subsequently transferred to the ICU.  Stabilized-and transferred back to [facility] on 11/29.  See below for further details.  Significant events: 11/23>> from SNF-confusion-COVID-positive-admit to [facility].   11/24>> transferred to ICU 11/26>> interventional radiology placed core track 11/27>> patient removed core track overnight with concerns for aspiration event during dislodgment 11/29>> transferred to [facility]  Significant studies: 11/23>> CT head: No acute finding 11/23>> CT angio chest: No PE, interstitial lung disease with small bilateral pleural effusions.  Significant microbiology data: 11/23>> COVID PCR: Positive 11/23>> influenza/RSV PCR: Negative 11/23>> blood culture: Negative 11/24>> blood culture: Negative  Procedures: None  Consults: PCCM  Subjective: Lying comfortably in bed-denies any chest pain or shortness of breath.  Appears frail-some what pleasantly confused but seems to be following most of my questions/commands.  Objective: Vitals: Blood pressure (!) 168/93, pulse (!) 103, temperature 97.8 F (36.6 C), temperature source Oral, resp. rate (!) 24, height 6\' 2"  (1.88 m), weight 59.4 kg, SpO2 98%.   Exam: Gen Exam:Alert awake-not in any distress thin frail appearing. HEENT:atraumatic, normocephalic Chest: B/L clear to auscultation anteriorly CVS:S1S2 regular Abdomen:soft  non tender, non distended Extremities:no edema Neurology: Non focal-generalized weakness. Skin: no rash  Pertinent Labs/Radiology:    Latest Ref Rng & Units 11/30/2022    4:50 AM 11/29/2022    7:03 AM 11/28/2022    8:36 AM  CBC  WBC 4.0 - 10.5 K/uL 9.3  12.1    Hemoglobin 13.0 - 17.0 g/dL 96.2  95.2  84.1   Hematocrit 39.0 - 52.0 % 44.0  44.8  43.0   Platelets 150 - 400 K/uL 131  98      Lab Results  Component Value Date   NA 140 11/30/2022   K 4.6 11/30/2022   CL 100 11/30/2022   CO2 31 11/30/2022      Assessment/Plan: Acute hypoxic/hypercarbic respiratory failure secondary to COVID-19 pneumonia Septic shock secondary to COVID-19 pneumonia/aspiration pneumonia Sepsis physiology/hypoxia has resolved-BP stable-now on room air Did require pressors in the ICU Completed Remdesivir x 3 days on 11/26, completed ceftriaxone x 5 days on 11/27 Continue Decadron x 10 days total Mobilize Pulmonary toileting SLP following-dysphagia 1 diet.  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  Chronic A-fib/atrial flutter with RVR Rate remains in the low 100s Start low-dose beta-blocker Change IV heparin to Eliquis  HTN BP on the higher side Metoprolol being started for RVR. Follow/optimize  Thrombocytopenia Mild Likely due to COVID-19 infection-improving.  Acute urinary retention Continue bethanechol-add Flomax Voiding trial tomorrow morning.  Oropharyngeal dysphagia Chronic issue-Per prior notes-has had recurrent aspiration pneumonia-has had weight loss due to issues with dysphagia SLP following-dysphagia 1 diet Await further recommendations-see how he does  with diet. Follow  closely.  History of severe COVID-19 infection in 2021 with prolonged ventilatory requirements-requiring PEG/trach (decannulated-PEG removed)-with residual interstitial lung disease  Palliative care Reviewed prior note from PCCM met w family-Continue full scope of treatment.  Debility/deconditioning PT/OT eval  Nutrition Status: Nutrition Problem: Severe Malnutrition Etiology: chronic illness (chronic dysphagia, CHF, interstitial lung disease) Signs/Symptoms: severe fat depletion, severe muscle depletion Interventions: Tube feeding, Refer to RD note for recommendations  Underweight: Estimated body mass index is 16.81 kg/m as calculated from the following:   Height as of this encounter: 6\' 2"  (1.88 m).   Weight as of this encounter: 59.4 kg.   Code status:   Code Status: Full Code   DVT Prophylaxis: IV heparin>> Eliquis   Family Communication: None at bedside   Disposition Plan: Status is: Inpatient Remains inpatient appropriate because: Severity of illness   Planned Discharge Destination:Skilled nursing facility   Diet: Diet Order             DIET - DYS 1 Fluid consistency: Honey Thick  Diet effective now                     Antimicrobial agents: Anti-infectives (From admission, onward)    Start     Dose/Rate Route Frequency Ordered Stop   11/26/22 1000  remdesivir 100 mg in sodium chloride 0.9 % 100 mL IVPB       Placed in "Followed by" Linked Group   100 mg 200 mL/hr over 30 Minutes Intravenous Daily 11/24/22 2309 11/27/22 1946   11/25/22 1800  azithromycin (ZITHROMAX) 500 mg in sodium chloride 0.9 % 250 mL IVPB        500 mg 250 mL/hr over 60 Minutes Intravenous Every 24 hours 11/24/22 2324 11/26/22 1915   11/25/22 1600  cefTRIAXone (ROCEPHIN) 1 g in sodium chloride 0.9 % 100 mL IVPB        1 g 200 mL/hr over 30 Minutes Intravenous Every 24 hours 11/24/22 2324 11/28/22 1629   11/25/22 0200  remdesivir 200 mg in sodium chloride 0.9% 250 mL IVPB        Placed in "Followed by" Linked Group   200 mg 580 mL/hr over 30 Minutes Intravenous Once 11/24/22 2309 11/25/22 0427   11/24/22 1600  cefTRIAXone (ROCEPHIN) 1 g in sodium chloride 0.9 % 100 mL IVPB        1 g 200 mL/hr over 30 Minutes Intravenous  Once 11/24/22 1555 11/24/22 1643   11/24/22 1600  azithromycin (ZITHROMAX) 500 mg in sodium chloride 0.9 % 250 mL IVPB        500 mg 250 mL/hr over 60 Minutes Intravenous  Once 11/24/22 1555 11/24/22 1800        MEDICATIONS: Scheduled Meds:  bethanechol  10 mg Oral TID   Chlorhexidine Gluconate Cloth  6 each Topical Daily   dexamethasone (DECADRON) injection  6 mg Intravenous Q24H   OLANZapine zydis  5 mg Oral Once   mouth rinse  15 mL Mouth Rinse 4 times per day   [START ON 12/05/2022] predniSONE  10 mg Oral Q breakfast   QUEtiapine  25 mg Oral BID   sertraline  25 mg Oral Daily   thiamine  100 mg Oral Daily   traZODone  50 mg Oral QHS   Continuous Infusions:  heparin 800 Units/hr (11/29/22 1300)   PRN Meds:.albuterol, bisacodyl, iohexol, labetalol, mouth rinse   I have personally reviewed following labs and imaging studies  LABORATORY DATA: CBC: Recent  Labs  Lab 11/24/22 1055 11/24/22 1123 11/25/22 0121 11/25/22 0254 11/26/22 0123 11/26/22 0130 11/26/22 1705 11/27/22 0400 11/28/22 0836 11/29/22 0703 11/30/22 0450  WBC 5.2  --  14.2*  --  11.3*  --   --  9.2  --  12.1* 9.3  NEUTROABS 3.5  --   --   --   --   --   --   --   --   --   --   HGB 15.5   < > 16.6   < > 11.5*   < > 12.6* 12.9* 14.6 14.9 14.5  HCT 47.0   < > 52.8*   < > 35.7*   < > 37.0* 38.8* 43.0 44.8 44.0  MCV 96.1  --  100.4*  --  97.0  --   --  96.5  --  95.3 93.6  PLT 170  --  153  --  122*  --   --  107*  --  98* 131*   < > = values in this interval not displayed.    Basic Metabolic Panel: Recent Labs  Lab 11/25/22 0119 11/25/22 0254 11/26/22 0123 11/26/22 0130 11/26/22 1705 11/27/22 0400 11/27/22 0954 11/27/22 1634 11/28/22 0824  11/28/22 0836 11/28/22 1807 11/30/22 0450  NA 142   < > 139   < > 144  --  145  --  143 141  --  140  K 5.2*   < > 4.3   < > 4.1  --  4.0  --  4.6 3.6  --  4.6  CL 104  --  105  --   --   --  107  --  105  --   --  100  CO2 20*  --  28  --   --   --  31  --  23  --   --  31  GLUCOSE 99  --  520*  --   --   --  190*  --  108*  --   --  109*  BUN 23  --  24*  --   --   --  17  --  19  --   --  20  CREATININE 1.72*  --  1.41*  --   --   --  0.93  --  0.98  --   --  1.06  CALCIUM 9.0  --  7.8*  --   --   --  8.2*  --  8.5*  --   --  8.7*  MG 2.1  --   --   --   --  1.8  --  1.7 1.9  --  1.9 1.9  PHOS 4.9*  --   --   --   --  2.7  --  2.1* 1.7*  --  2.8 2.4*   < > = values in this interval not displayed.    GFR: Estimated Creatinine Clearance: 41.3 mL/min (by C-G formula based on SCr of 1.06 mg/dL).  Liver Function Tests: Recent Labs  Lab 11/24/22 1605 11/27/22 0954  AST 25 33  ALT 30 36  ALKPHOS 39 41  BILITOT 1.2* 0.7  PROT 6.6 4.8*  ALBUMIN 3.4* 2.3*   No results for input(s): "LIPASE", "AMYLASE" in the last 168 hours. Recent Labs  Lab 11/24/22 2011  AMMONIA <10    Coagulation Profile: No results for input(s): "INR", "PROTIME" in the last 168 hours.  Cardiac Enzymes: No results for input(s): "CKTOTAL", "  CKMB", "CKMBINDEX", "TROPONINI" in the last 168 hours.  BNP (last 3 results) No results for input(s): "PROBNP" in the last 8760 hours.  Lipid Profile: No results for input(s): "CHOL", "HDL", "LDLCALC", "TRIG", "CHOLHDL", "LDLDIRECT" in the last 72 hours.  Thyroid Function Tests: No results for input(s): "TSH", "T4TOTAL", "FREET4", "T3FREE", "THYROIDAB" in the last 72 hours.  Anemia Panel: No results for input(s): "VITAMINB12", "FOLATE", "FERRITIN", "TIBC", "IRON", "RETICCTPCT" in the last 72 hours.  Urine analysis:    Component Value Date/Time   COLORURINE YELLOW 11/24/2022 1625   APPEARANCEUR CLEAR 11/24/2022 1625   LABSPEC 1.010 11/24/2022 1625   PHURINE  5.0 11/24/2022 1625   GLUCOSEU NEGATIVE 11/24/2022 1625   HGBUR NEGATIVE 11/24/2022 1625   BILIRUBINUR NEGATIVE 11/24/2022 1625   KETONESUR NEGATIVE 11/24/2022 1625   PROTEINUR NEGATIVE 11/24/2022 1625   NITRITE NEGATIVE 11/24/2022 1625   LEUKOCYTESUR SMALL (A) 11/24/2022 1625    Sepsis Labs: Lactic Acid, Venous    Component Value Date/Time   LATICACIDVEN 1.2 11/26/2022 1133    MICROBIOLOGY: Recent Results (from the past 240 hour(s))  Blood culture (routine x 2)     Status: None   Collection Time: 11/24/22  3:55 PM   Specimen: BLOOD  Result Value Ref Range Status   Specimen Description BLOOD SITE NOT SPECIFIED  Final   Special Requests   Final    BOTTLES DRAWN AEROBIC AND ANAEROBIC Blood Culture adequate volume   Culture   Final    NO GROWTH 5 DAYS Performed at [facility], [address] [place], [place] [address]    Report Status 11/29/2022 FINAL  Final  Resp panel by RT-PCR (RSV, Flu A&B, Covid) Anterior Nasal Swab     Status: Abnormal   Collection Time: 11/24/22  4:07 PM   Specimen: Anterior Nasal Swab  Result Value Ref Range Status   SARS Coronavirus 2 by RT PCR POSITIVE (A) NEGATIVE Final   Influenza A by PCR NEGATIVE NEGATIVE Final   Influenza B by PCR NEGATIVE NEGATIVE Final    Comment: (NOTE) The Xpert Xpress SARS-CoV-2/FLU/RSV plus assay is intended as an aid in the diagnosis of influenza from Nasopharyngeal swab specimens and should not be used as a sole basis for treatment. Nasal washings and aspirates are unacceptable for Xpert Xpress SARS-CoV-2/FLU/RSV testing.  Fact Sheet for Patients: BloggerCourse.com  Fact Sheet for Healthcare Providers: SeriousBroker.it  This test is not yet approved or cleared by the [place] FDA and has been authorized for detection and/or diagnosis of SARS-CoV-2 by FDA under an Emergency Use Authorization (EUA). This EUA will remain in effect (meaning this test  can be used) for the duration of the COVID-19 declaration under Section 564(b)(1) of the Act, 21 U.S.C. section 360bbb-3(b)(1), unless the authorization is terminated or revoked.     Resp Syncytial Virus by PCR NEGATIVE NEGATIVE Final    Comment: (NOTE) Fact Sheet for Patients: BloggerCourse.com  Fact Sheet for Healthcare Providers: SeriousBroker.it  This test is not yet approved or cleared by the [place] FDA and has been authorized for detection and/or diagnosis of SARS-CoV-2 by FDA under an Emergency Use Authorization (EUA). This EUA will remain in effect (meaning this test can be used) for the duration of the COVID-19 declaration under Section 564(b)(1) of the Act, 21 U.S.C. section 360bbb-3(b)(1), unless the authorization is terminated or revoked.  Performed at [facility], [address] [place], [place] [address]   Blood culture (routine x 2)     Status: None (  Preliminary result)   Collection Time: 11/25/22  1:19 AM   Specimen: BLOOD RIGHT HAND  Result Value Ref Range Status   Specimen Description BLOOD RIGHT HAND  Final   Special Requests   Final    BOTTLES DRAWN AEROBIC ONLY Blood Culture adequate volume   Culture   Final    NO GROWTH 4 DAYS Performed at [facility], [address], [place], [place] [address]    Report Status PENDING  Incomplete  MRSA Next Gen by PCR, Nasal     Status: Abnormal   Collection Time: 11/25/22  2:15 AM   Specimen: Nasal Mucosa; Nasal Swab  Result Value Ref Range Status   MRSA by PCR Next Gen DETECTED (A) NOT DETECTED Final    Comment: RESULT CALLED TO, READ BACK BY AND VERIFIED WITH: J PATZ,RN@[id number]  11/25/22 MK (NOTE) The GeneXpert MRSA Assay (FDA approved for NASAL specimens only), is one component of a comprehensive MRSA colonization surveillance program. It is not intended to diagnose MRSA infection nor to guide or monitor treatment for MRSA infections. Test  performance is not FDA approved in patients less than 31 years old. Performed at [facility], [address], [place], [place] [address]     RADIOLOGY STUDIES/RESULTS: No results found.   LOS: 6 days   Jeoffrey Massed, MD  [facility]    To contact the attending provider between 7A-7P or the covering provider during after hours 7P-7A, please log into the web site [email or web address] and access using universal [place] password for that web site. If you do not have the password, please call the hospital operator.  11/30/2022, 9:36 AM

## 2022-11-30 NOTE — Plan of Care (Signed)
  Problem: Coping: Goal: Psychosocial and spiritual needs will be supported Outcome: Progressing   Problem: Respiratory: Goal: Will maintain a patent airway Outcome: Progressing Goal: Complications related to the disease process, condition or treatment will be avoided or minimized Outcome: Progressing   

## 2022-12-01 DIAGNOSIS — U071 COVID-19: Secondary | ICD-10-CM | POA: Diagnosis not present

## 2022-12-01 DIAGNOSIS — R000 Tachycardia, unspecified: Secondary | ICD-10-CM | POA: Diagnosis not present

## 2022-12-01 DIAGNOSIS — J849 Interstitial pulmonary disease, unspecified: Secondary | ICD-10-CM | POA: Diagnosis not present

## 2022-12-01 LAB — GLUCOSE, CAPILLARY
Glucose-Capillary: 110 mg/dL — ABNORMAL HIGH (ref 70–99)
Glucose-Capillary: 132 mg/dL — ABNORMAL HIGH (ref 70–99)
Glucose-Capillary: 141 mg/dL — ABNORMAL HIGH (ref 70–99)
Glucose-Capillary: 93 mg/dL (ref 70–99)

## 2022-12-01 NOTE — Progress Notes (Signed)
[facility] PROGRESS NOTE    Progress Note  Robert Carpenter  ZOX:[id number] DOB: 1933-12-02 DOA: 11/24/2022 PCP: Shayne Alken, MD     Brief Narrative:   Robert Carpenter is an 89 y.o. male past medical history of severe COVID infection in 2021 with a prolonged VT arrest trach and PEG status post decannulation and PEG removal with residual fibrosis, chronic atrial fibrillation, chronic diastolic heart failure dementia comes into the hospital with confusion complicated by hypercapnia not tolerated BiPAP subsequently abated transferred back to [facility] on 11/30/2022  Significant Events: 11/23>> from SNF-confusion-COVID-positive-admit to [facility].   11/24>> transferred to ICU 11/26>> interventional radiology placed core track 11/27>> patient removed core track overnight with concerns for aspiration event during dislodgment 11/29>> transferred to [facility]  Significant studies: 11/23>> CT head: No acute finding 11/23>> CT angio chest: No PE, interstitial lung disease with small bilateral pleural effusions.    Antibiotics: None  Microbiology data: 11/23>> COVID PCR: Positive 11/23>> influenza/RSV PCR: Negative 11/23>> blood culture: Negative 11/24>> blood culture: Negative  Procedures: None  Assessment/Plan:   Acute respiratory failure with hypoxia and hypercarbia secondary to Pneumonia due to COVID-19 virus/septic shock secondary to COVID-19 pneumonia and possibly aspiration pneumonia: Sepsis physiology has resolved has been weaned off pressors. Completed course of remdesivir and Rocephin. Currently on Decadron for 10 days. Dysphagia 1 diet. He has now been weaned to room air.  Acute metabolic encephalopathy superimposed on dementia: Etiology multifactorial likely due to hypoxia hypercarbia and sepsis much improved. Continue delirium precautions.  Chronic combined diastolic and systolic heart failure: Lasix as needed. Appears euvolemic.  Chronic atrial  fibrillation/A-flutter with A-fib: Rate controlled continue beta-blocker and Eliquis.  Essential hypertension: Continue metoprolol.  Mild thrombocytopenia: Likely due to infectious etiology.  Acute urinary retention: Continue Pravachol and Flomax. Hopefully voiding trial tomorrow.  Oropharyngeal dysphagia: Speech evaluated the patient now on a dysphagia 1 diet.  History of severe COVID-19 infection in 2021 with prolonged vent requirement trach and PEG (decannulated and PEG removed)/now with pulmonary fibrosis: Noted has been weaned to room air.  Palliative care: Family wants to continue full scope of treatment.  Debility/deconditioning: PT OT evaluated the patient recommended short-term rehab. Awaiting rehab placement.  Severe protein caloric malnutrition: Noted.  Underweight  DVT prophylaxis: eliquis Family Communication:none Status is: Inpatient Remains inpatient appropriate because: COVID-19 infection    Code Status:     Code Status Orders  (From admission, onward)           Start     Ordered   11/24/22 2316  Full code  Continuous       Question:  By:  Answer:  Consent: discussion documented in EHR   11/24/22 2317           Code Status History     Date Active Date Inactive Code Status Order ID Comments User Context   01/13/2021 0007 01/23/2021 2111 Full Code [id number]  Synetta Fail, MD ED         IV Access:   Peripheral IV   Procedures and diagnostic studies:   No results found.   Medical Consultants:   None.   Subjective:    Robert Carpenter no complaints  Objective:    Vitals:   11/30/22 2016 11/30/22 2300 12/01/22 0400 12/01/22 0426  BP: (!) 144/99 121/83 (!) 136/97   Pulse: 94 75 93   Resp: (!) 21 (!) 22 20   Temp: 97.7 F (36.5 C) (!) 97.5 F (36.4 C) (!) 97.1  F (36.2 C)   TempSrc: Axillary Oral Axillary   SpO2: 96% 98% 99%   Weight:    63.4 kg  Height:       SpO2: 99 % O2 Flow Rate (L/min): 2  L/min FiO2 (%): 30 %   Intake/Output Summary (Last 24 hours) at 12/01/2022 0908 Last data filed at 11/30/2022 2349 Gross per 24 hour  Intake --  Output 400 ml  Net -400 ml   Filed Weights   11/28/22 0500 11/29/22 0457 12/01/22 0426  Weight: 59.2 kg 59.4 kg 63.4 kg    Exam: General exam: In no acute distress. Respiratory system: Good air movement and clear to auscultation. Cardiovascular system: S1 & S2 heard, RRR. No JVD. Gastrointestinal system: Abdomen is nondistended, soft and nontender.  Extremities: No pedal edema. Skin: No rashes, lesions or ulcers Psychiatry: Judgement and insight appear normal. Mood & affect appropriate.    Data Reviewed:    Labs: Basic Metabolic Panel: Recent Labs  Lab 11/25/22 0119 11/25/22 0254 11/26/22 0123 11/26/22 0130 11/26/22 1705 11/27/22 0400 11/27/22 0954 11/27/22 1634 11/28/22 0824 11/28/22 0836 11/28/22 1807 11/30/22 0450  NA 142   < > 139   < > 144  --  145  --  143 141  --  140  K 5.2*   < > 4.3   < > 4.1  --  4.0  --  4.6 3.6  --  4.6  CL 104  --  105  --   --   --  107  --  105  --   --  100  CO2 20*  --  28  --   --   --  31  --  23  --   --  31  GLUCOSE 99  --  520*  --   --   --  190*  --  108*  --   --  109*  BUN 23  --  24*  --   --   --  17  --  19  --   --  20  CREATININE 1.72*  --  1.41*  --   --   --  0.93  --  0.98  --   --  1.06  CALCIUM 9.0  --  7.8*  --   --   --  8.2*  --  8.5*  --   --  8.7*  MG 2.1  --   --   --   --  1.8  --  1.7 1.9  --  1.9 1.9  PHOS 4.9*  --   --   --   --  2.7  --  2.1* 1.7*  --  2.8 2.4*   < > = values in this interval not displayed.   GFR Estimated Creatinine Clearance: 44 mL/min (by C-G formula based on SCr of 1.06 mg/dL). Liver Function Tests: Recent Labs  Lab 11/24/22 1605 11/27/22 0954  AST 25 33  ALT 30 36  ALKPHOS 39 41  BILITOT 1.2* 0.7  PROT 6.6 4.8*  ALBUMIN 3.4* 2.3*   No results for input(s): "LIPASE", "AMYLASE" in the last 168 hours. Recent Labs  Lab  11/24/22 2011  AMMONIA <10   Coagulation profile No results for input(s): "INR", "PROTIME" in the last 168 hours. COVID-19 Labs  No results for input(s): "DDIMER", "FERRITIN", "LDH", "CRP" in the last 72 hours.  Lab Results  Component Value Date   SARSCOV2NAA POSITIVE (A) 11/24/2022   SARSCOV2NAA NEGATIVE 01/23/2021  SARSCOV2NAA NEGATIVE 01/12/2021   SARSCOV2NAA NOT DETECTED 03/28/2020    CBC: Recent Labs  Lab 11/24/22 1055 11/24/22 1123 11/25/22 0121 11/25/22 0254 11/26/22 0123 11/26/22 0130 11/26/22 1705 11/27/22 0400 11/28/22 0836 11/29/22 0703 11/30/22 0450  WBC 5.2  --  14.2*  --  11.3*  --   --  9.2  --  12.1* 9.3  NEUTROABS 3.5  --   --   --   --   --   --   --   --   --   --   HGB 15.5   < > 16.6   < > 11.5*   < > 12.6* 12.9* 14.6 14.9 14.5  HCT 47.0   < > 52.8*   < > 35.7*   < > 37.0* 38.8* 43.0 44.8 44.0  MCV 96.1  --  100.4*  --  97.0  --   --  96.5  --  95.3 93.6  PLT 170  --  153  --  122*  --   --  107*  --  98* 131*   < > = values in this interval not displayed.   Cardiac Enzymes: No results for input(s): "CKTOTAL", "CKMB", "CKMBINDEX", "TROPONINI" in the last 168 hours. BNP (last 3 results) No results for input(s): "PROBNP" in the last 8760 hours. CBG: Recent Labs  Lab 11/30/22 0403 11/30/22 0821 11/30/22 2015 11/30/22 2339 12/01/22 0420  GLUCAP 114* 157* 86 132* 93   D-Dimer: No results for input(s): "DDIMER" in the last 72 hours. Hgb A1c: No results for input(s): "HGBA1C" in the last 72 hours. Lipid Profile: No results for input(s): "CHOL", "HDL", "LDLCALC", "TRIG", "CHOLHDL", "LDLDIRECT" in the last 72 hours. Thyroid function studies: No results for input(s): "TSH", "T4TOTAL", "T3FREE", "THYROIDAB" in the last 72 hours.  Invalid input(s): "FREET3" Anemia work up: No results for input(s): "VITAMINB12", "FOLATE", "FERRITIN", "TIBC", "IRON", "RETICCTPCT" in the last 72 hours. Sepsis Labs: Recent Labs  Lab 11/24/22 2310  11/25/22 0121 11/25/22 0341 11/25/22 1143 11/26/22 0123 11/26/22 1133 11/27/22 0400 11/29/22 0703 11/30/22 0450  WBC  --    < >  --   --  11.3*  --  9.2 12.1* 9.3  LATICACIDVEN 5.1*  --  6.7* 3.9*  --  1.2  --   --   --    < > = values in this interval not displayed.   Microbiology Recent Results (from the past 240 hour(s))  Blood culture (routine x 2)     Status: None   Collection Time: 11/24/22  3:55 PM   Specimen: BLOOD  Result Value Ref Range Status   Specimen Description BLOOD SITE NOT SPECIFIED  Final   Special Requests   Final    BOTTLES DRAWN AEROBIC AND ANAEROBIC Blood Culture adequate volume   Culture   Final    NO GROWTH 5 DAYS Performed at [facility], [address] [place], [place] [address]    Report Status 11/29/2022 FINAL  Final  Resp panel by RT-PCR (RSV, Flu A&B, Covid) Anterior Nasal Swab     Status: Abnormal   Collection Time: 11/24/22  4:07 PM   Specimen: Anterior Nasal Swab  Result Value Ref Range Status   SARS Coronavirus 2 by RT PCR POSITIVE (A) NEGATIVE Final   Influenza A by PCR NEGATIVE NEGATIVE Final   Influenza B by PCR NEGATIVE NEGATIVE Final    Comment: (NOTE) The Xpert Xpress SARS-CoV-2/FLU/RSV plus assay is intended as an aid in the diagnosis of  influenza from Nasopharyngeal swab specimens and should not be used as a sole basis for treatment. Nasal washings and aspirates are unacceptable for Xpert Xpress SARS-CoV-2/FLU/RSV testing.  Fact Sheet for Patients: BloggerCourse.com  Fact Sheet for Healthcare Providers: SeriousBroker.it  This test is not yet approved or cleared by the [place] FDA and has been authorized for detection and/or diagnosis of SARS-CoV-2 by FDA under an Emergency Use Authorization (EUA). This EUA will remain in effect (meaning this test can be used) for the duration of the COVID-19 declaration under Section 564(b)(1) of the Act, 21 U.S.C. section  360bbb-3(b)(1), unless the authorization is terminated or revoked.     Resp Syncytial Virus by PCR NEGATIVE NEGATIVE Final    Comment: (NOTE) Fact Sheet for Patients: BloggerCourse.com  Fact Sheet for Healthcare Providers: SeriousBroker.it  This test is not yet approved or cleared by the [place] FDA and has been authorized for detection and/or diagnosis of SARS-CoV-2 by FDA under an Emergency Use Authorization (EUA). This EUA will remain in effect (meaning this test can be used) for the duration of the COVID-19 declaration under Section 564(b)(1) of the Act, 21 U.S.C. section 360bbb-3(b)(1), unless the authorization is terminated or revoked.  Performed at [facility], [address], [place], [place] [address]   Blood culture (routine x 2)     Status: None   Collection Time: 11/25/22  1:19 AM   Specimen: BLOOD RIGHT HAND  Result Value Ref Range Status   Specimen Description BLOOD RIGHT HAND  Final   Special Requests   Final    BOTTLES DRAWN AEROBIC ONLY Blood Culture adequate volume   Culture   Final    NO GROWTH 5 DAYS Performed at [facility], [address] [place], [place] [address]    Report Status 11/30/2022 FINAL  Final  MRSA Next Gen by PCR, Nasal     Status: Abnormal   Collection Time: 11/25/22  2:15 AM   Specimen: Nasal Mucosa; Nasal Swab  Result Value Ref Range Status   MRSA by PCR Next Gen DETECTED (A) NOT DETECTED Final    Comment: RESULT CALLED TO, READ BACK BY AND VERIFIED WITH: J PATZ,RN@[id number]  11/25/22 MK (NOTE) The GeneXpert MRSA Assay (FDA approved for NASAL specimens only), is one component of a comprehensive MRSA colonization surveillance program. It is not intended to diagnose MRSA infection nor to guide or monitor treatment for MRSA infections. Test performance is not FDA approved in patients less than 63 years old. Performed at [facility], [address],  [place], [place] [address]      Medications:    apixaban  2.5 mg Oral BID   bethanechol  10 mg Oral TID   Chlorhexidine Gluconate Cloth  6 each Topical Daily   dexamethasone (DECADRON) injection  6 mg Intravenous Q24H   metoprolol tartrate  25 mg Oral BID   OLANZapine zydis  5 mg Oral Once   mouth rinse  15 mL Mouth Rinse 4 times per day   [START ON 12/05/2022] predniSONE  10 mg Oral Q breakfast   QUEtiapine  25 mg Oral BID   sertraline  25 mg Oral Daily   tamsulosin  0.4 mg Oral Daily   thiamine  100 mg Oral Daily   traZODone  50 mg Oral QHS   Continuous Infusions:    LOS: 7 days   Marinda Elk  [facility]  12/01/2022, 9:08 AM

## 2022-12-01 NOTE — Plan of Care (Signed)
  Problem: Education: Goal: Knowledge of risk factors and measures for prevention of condition will improve Outcome: Progressing   Problem: Clinical Measurements: Goal: Respiratory complications will improve Outcome: Progressing   Problem: Pain Management: Goal: General experience of comfort will improve Outcome: Progressing

## 2022-12-02 ENCOUNTER — Inpatient Hospital Stay (HOSPITAL_COMMUNITY): Payer: Medicare Other

## 2022-12-02 DIAGNOSIS — J1282 Pneumonia due to coronavirus disease 2019: Secondary | ICD-10-CM | POA: Diagnosis not present

## 2022-12-02 DIAGNOSIS — U071 COVID-19: Secondary | ICD-10-CM | POA: Diagnosis not present

## 2022-12-02 LAB — CBC WITH DIFFERENTIAL/PLATELET
Abs Immature Granulocytes: 0.03 10*3/uL (ref 0.00–0.07)
Basophils Absolute: 0 10*3/uL (ref 0.0–0.1)
Basophils Relative: 0 %
Eosinophils Absolute: 0 10*3/uL (ref 0.0–0.5)
Eosinophils Relative: 0 %
HCT: 44.2 % (ref 39.0–52.0)
Hemoglobin: 14.8 g/dL (ref 13.0–17.0)
Immature Granulocytes: 0 %
Lymphocytes Relative: 10 %
Lymphs Abs: 0.8 10*3/uL (ref 0.7–4.0)
MCH: 31.9 pg (ref 26.0–34.0)
MCHC: 33.5 g/dL (ref 30.0–36.0)
MCV: 95.3 fL (ref 80.0–100.0)
Monocytes Absolute: 0.4 10*3/uL (ref 0.1–1.0)
Monocytes Relative: 5 %
Neutro Abs: 6.4 10*3/uL (ref 1.7–7.7)
Neutrophils Relative %: 85 %
Platelets: 142 10*3/uL — ABNORMAL LOW (ref 150–400)
RBC: 4.64 MIL/uL (ref 4.22–5.81)
RDW: 15.3 % (ref 11.5–15.5)
WBC: 7.6 10*3/uL (ref 4.0–10.5)
nRBC: 0 % (ref 0.0–0.2)

## 2022-12-02 LAB — GLUCOSE, CAPILLARY
Glucose-Capillary: 112 mg/dL — ABNORMAL HIGH (ref 70–99)
Glucose-Capillary: 122 mg/dL — ABNORMAL HIGH (ref 70–99)
Glucose-Capillary: 127 mg/dL — ABNORMAL HIGH (ref 70–99)
Glucose-Capillary: 127 mg/dL — ABNORMAL HIGH (ref 70–99)

## 2022-12-02 LAB — PROCALCITONIN: Procalcitonin: 0.1 ng/mL

## 2022-12-02 LAB — BRAIN NATRIURETIC PEPTIDE: B Natriuretic Peptide: 939.5 pg/mL — ABNORMAL HIGH (ref 0.0–100.0)

## 2022-12-02 LAB — BASIC METABOLIC PANEL
Anion gap: 13 (ref 5–15)
BUN: 31 mg/dL — ABNORMAL HIGH (ref 8–23)
CO2: 26 mmol/L (ref 22–32)
Calcium: 8.8 mg/dL — ABNORMAL LOW (ref 8.9–10.3)
Chloride: 103 mmol/L (ref 98–111)
Creatinine, Ser: 1.04 mg/dL (ref 0.61–1.24)
GFR, Estimated: >60 mL/min (ref >60)
Glucose, Bld: 125 mg/dL — ABNORMAL HIGH (ref 70–99)
Potassium: 4.5 mmol/L (ref 3.5–5.1)
Sodium: 142 mmol/L (ref 135–145)

## 2022-12-02 LAB — C-REACTIVE PROTEIN: CRP: 6.7 mg/dL — ABNORMAL HIGH (ref <1.0)

## 2022-12-02 LAB — PHOSPHORUS: Phosphorus: 3.1 mg/dL (ref 2.5–4.6)

## 2022-12-02 LAB — MAGNESIUM: Magnesium: 1.9 mg/dL (ref 1.7–2.4)

## 2022-12-02 MED ORDER — PREDNISONE 5 MG PO TABS: 10.0000 mg | ORAL_TABLET | Freq: Every day | ORAL | Status: DC

## 2022-12-02 MED ORDER — PREDNISONE 5 MG PO TABS: 5.0000 mg | ORAL_TABLET | Freq: Every day | ORAL | Status: DC

## 2022-12-02 MED ORDER — METOPROLOL TARTRATE 50 MG PO TABS
50.0000 mg | ORAL_TABLET | Freq: Two times a day (BID) | ORAL | Status: DC
  Administered 2022-12-02: 50 mg via ORAL
  Filled 2022-12-02: qty 1

## 2022-12-02 MED ORDER — PROSOURCE PLUS PO LIQD
30.0000 mL | Freq: Two times a day (BID) | ORAL | Status: DC
  Filled 2022-12-02 (×3): qty 30

## 2022-12-02 MED ORDER — FUROSEMIDE 10 MG/ML IJ SOLN
20.0000 mg | Freq: Once | INTRAMUSCULAR | Status: AC
  Administered 2022-12-02: 20 mg via INTRAVENOUS
  Filled 2022-12-02: qty 2

## 2022-12-02 MED ORDER — METOPROLOL TARTRATE 100 MG PO TABS
100.0000 mg | ORAL_TABLET | Freq: Two times a day (BID) | ORAL | Status: DC
  Administered 2022-12-02 – 2022-12-04 (×4): 100 mg via ORAL
  Filled 2022-12-02 (×4): qty 1

## 2022-12-02 MED ORDER — DEXTROSE 5 % IV SOLN: INTRAVENOUS | Status: AC

## 2022-12-02 MED ORDER — MAGNESIUM SULFATE 2 GM/50ML IV SOLN
2.0000 g | Freq: Once | INTRAVENOUS | Status: AC
  Administered 2022-12-02: 2 g via INTRAVENOUS
  Filled 2022-12-02: qty 50

## 2022-12-02 NOTE — Progress Notes (Signed)
PROGRESS NOTE                                                                                                                                                                                                             Patient Demographics:    Robert Carpenter, is a 89 y.o. male, DOB - [birth year], EXB:[id number]  Outpatient Primary MD for the patient is Shayne Alken, MD    LOS - 8  Admit date - 11/24/2022    Chief Complaint  Patient presents with   Altered Mental Status       Brief Narrative (HPI from H&P)   Robert Carpenter is an 89 y.o. male past medical history of severe COVID infection in 2021 with a prolonged VT arrest trach and PEG status post decannulation and PEG removal with residual fibrosis, chronic atrial fibrillation, chronic diastolic heart failure dementia comes into the hospital with confusion complicated by hypercapnia not tolerated BiPAP subsequently abated transferred back to [facility] 11/30/2022   Significant Events: 11/23>> from SNF-confusion-COVID-positive-admit to [facility].   11/24>> transferred to ICU 11/26>> interventional radiology placed core track 11/27>> patient removed core track overnight with concerns for aspiration event during dislodgment 11/29>> transferred to [facility]   Significant studies: 11/23>> CT head: No acute finding 11/23>> CT angio chest: No PE, interstitial lung disease with small bilateral pleural effusions.     Antibiotics: None   Microbiology data: 11/23>> COVID PCR: Positive 11/23>> influenza/RSV PCR: Negative 11/23>> blood culture: Negative 11/24>> blood culture: Negative   Procedures: None   Subjective:    Robert Carpenter today has, No headache, No chest pain, No abdominal pain - No Nausea, No new weakness tingling or numbness, improved SOB.   Assessment  & Plan :    Acute respiratory failure with hypoxia and hypercarbia secondary to Pneumonia due to  COVID-19 virus/septic shock secondary to COVID-19 pneumonia and possibly aspiration pneumonia: Sepsis physiology has resolved has been weaned off pressors. Completed course of remdesivir and Rocephin. Placed him back on his home dose steroid of 10 mg prednisone. Dysphagia 1 diet. He has now been weaned to room air.   Acute metabolic encephalopathy superimposed on dementia: Etiology multifactorial likely due to hypoxia hypercarbia and sepsis much improved. Continue delirium precautions.  Continue Seroquel twice daily.   Chronic combined diastolic and systolic heart failure: Lasix as needed.  Appears euvolemic.   Chronic atrial fibrillation/A-flutter with A-fib: Rate controlled continue beta-blocker and Eliquis.   Essential hypertension: Continue metoprolol, adjust dose for better control   Mild thrombocytopenia: Likely due to infectious etiology.   Acute urinary retention: Continue Pravachol and Flomax. Hopefully voiding trial tomorrow.   Oropharyngeal dysphagia: Speech evaluated the patient now on a dysphagia 1 diet.   History of severe COVID-19 infection in 2021 with prolonged vent requirement trach and PEG (decannulated and PEG removed)/now with pulmonary fibrosis: Noted has been weaned to room air.   Palliative care: Family wants to continue full scope of treatment.   Debility/deconditioning: PT OT evaluated the patient recommended short-term rehab. Awaiting rehab placement.   Severe protein caloric malnutrition: Placed on protein supplementation oral intake is poor, gentle D5 for a day or 2.         Condition - Extremely Guarded  Family Communication  :  None  Code Status :  Full  Consults  :    PUD Prophylaxis :     Procedures  :            Disposition Plan  :    Status is: Inpatient  DVT Prophylaxis  :    apixaban (ELIQUIS) tablet 2.5 mg Start: 11/30/22 1415 apixaban (ELIQUIS) tablet 2.5 mg      Lab Results  Component Value Date   PLT  142 (L) 12/02/2022    Diet :  Diet Order             DIET - DYS 1 Fluid consistency: Honey Thick  Diet effective now                    Inpatient Medications  Scheduled Meds:  (feeding supplement) PROSource Plus  30 mL Oral BID BM   apixaban  2.5 mg Oral BID   Chlorhexidine Gluconate Cloth  6 each Topical Daily   metoprolol tartrate  50 mg Oral BID   OLANZapine zydis  5 mg Oral Once   mouth rinse  15 mL Mouth Rinse 4 times per day   [START ON 12/05/2022] predniSONE  10 mg Oral Q breakfast   QUEtiapine  25 mg Oral BID   sertraline  25 mg Oral Daily   tamsulosin  0.4 mg Oral Daily   traZODone  50 mg Oral QHS   Continuous Infusions:  dextrose     PRN Meds:.albuterol, bisacodyl, iohexol, labetalol, mouth rinse    Objective:   Vitals:   12/01/22 1942 12/01/22 2345 12/02/22 0400 12/02/22 0500  BP: 126/83 (!) 131/96 (!) 130/95   Pulse: 94 96 (!) 101   Resp: 18 (!) 21 19   Temp: 97.6 F (36.4 C) (!) 97.5 F (36.4 C) 97.6 F (36.4 C)   TempSrc: Axillary Oral Oral   SpO2: 100% 96% 100%   Weight:    63.2 kg  Height:        Wt Readings from Last 3 Encounters:  12/02/22 63.2 kg  07/18/21 78.6 kg  01/21/21 69 kg     Intake/Output Summary (Last 24 hours) at 12/02/2022 1040 Last data filed at 12/01/2022 2057 Gross per 24 hour  Intake --  Output 500 ml  Net -500 ml     Physical Exam  Awake but confused, No new F.N deficits, Normal affect [place].AT,PERRAL Supple Neck, No JVD,   Symmetrical Chest wall movement, Good air movement bilaterally, CTAB RRR,No Gallops,Rubs or new Murmurs,  +ve B.Sounds, Abd Soft, No tenderness,   No Cyanosis,  Clubbing or edema     Data Review:    Recent Labs  Lab 11/26/22 0123 11/26/22 0130 11/27/22 0400 11/28/22 0836 11/29/22 0703 11/30/22 0450 12/02/22 0620  WBC 11.3*  --  9.2  --  12.1* 9.3 7.6  HGB 11.5*   < > 12.9* 14.6 14.9 14.5 14.8  HCT 35.7*   < > 38.8* 43.0 44.8 44.0 44.2  PLT 122*  --  107*  --  98* 131* 142*   MCV 97.0  --  96.5  --  95.3 93.6 95.3  MCH 31.3  --  32.1  --  31.7 30.9 31.9  MCHC 32.2  --  33.2  --  33.3 33.0 33.5  RDW 15.7*  --  15.7*  --  15.4 15.1 15.3  LYMPHSABS  --   --   --   --   --   --  0.8  MONOABS  --   --   --   --   --   --  0.4  EOSABS  --   --   --   --   --   --  0.0  BASOSABS  --   --   --   --   --   --  0.0   < > = values in this interval not displayed.    Recent Labs  Lab 11/25/22 1143 11/25/22 1213 11/26/22 0123 11/26/22 0130 11/26/22 1133 11/26/22 1237 11/27/22 0954 11/27/22 1634 11/28/22 0824 11/28/22 0836 11/28/22 1807 11/30/22 0450 12/02/22 0620  NA  --    < > 139   < >  --    < > 145  --  143 141  --  140 142  K  --    < > 4.3   < >  --    < > 4.0  --  4.6 3.6  --  4.6 4.5  CL  --   --  105  --   --   --  107  --  105  --   --  100 103  CO2  --   --  28  --   --   --  31  --  23  --   --  31 26  ANIONGAP  --   --  6  --   --   --  7  --  15  --   --  9 13  GLUCOSE  --   --  520*  --   --   --  190*  --  108*  --   --  109* 125*  BUN  --   --  24*  --   --   --  17  --  19  --   --  20 31*  CREATININE  --   --  1.41*  --   --   --  0.93  --  0.98  --   --  1.06 1.04  AST  --   --   --   --   --   --  33  --   --   --   --   --   --   ALT  --   --   --   --   --   --  36  --   --   --   --   --   --   ALKPHOS  --   --   --   --   --   --  41  --   --   --   --   --   --   BILITOT  --   --   --   --   --   --  0.7  --   --   --   --   --   --   ALBUMIN  --   --   --   --   --   --  2.3*  --   --   --   --   --   --   CRP  --   --   --   --   --   --   --   --   --   --   --   --  6.7*  LATICACIDVEN 3.9*  --   --   --  1.2  --   --   --   --   --   --   --   --   BNP  --   --   --   --  378.7*  --   --   --   --   --   --   --  939.5*  MG  --   --   --   --   --    < >  --  1.7 1.9  --  1.9 1.9 1.9  CALCIUM  --   --  7.8*  --   --   --  8.2*  --  8.5*  --   --  8.7* 8.8*   < > = values in this interval not displayed.      Recent Labs   Lab 11/25/22 1143 11/26/22 0123 11/26/22 1133 11/27/22 0400 11/27/22 0954 11/27/22 1634 11/28/22 0824 11/28/22 1807 11/30/22 0450 12/02/22 0620  CRP  --   --   --   --   --   --   --   --   --  6.7*  LATICACIDVEN 3.9*  --  1.2  --   --   --   --   --   --   --   BNP  --   --  378.7*  --   --   --   --   --   --  939.5*  MG  --   --   --    < >  --  1.7 1.9 1.9 1.9 1.9  CALCIUM  --  7.8*  --   --  8.2*  --  8.5*  --  8.7* 8.8*   < > = values in this interval not displayed.    Radiology Reports DG Chest Port 1 View  Result Date: 12/02/2022 CLINICAL DATA:  89 year old male with history of shortness of breath. EXAM: PORTABLE CHEST 1 VIEW COMPARISON:  Chest x-ray 11/27/2022. FINDINGS: Lung volumes are low. Diffuse interstitial prominence and widespread patchy ill-defined opacities scattered throughout the lungs bilaterally with relative sparing of the left upper lobe. Overall, aeration appears similar to slightly worsened compared to the prior study. No pleural effusions. No pneumothorax. No evidence of pulmonary edema. Heart size is mildly enlarged. The patient is rotated to the upper mediastinal contours are distorted by patient positioning. Prominence of soft tissues in the high left paratracheal region corresponding to asymmetric substernal goiter demonstrated on prior chest CT. On today's exam, resulting in distortion of the mediastinal contours and reduced diagnostic sensitivity and specificity for mediastinal pathology. IMPRESSION: 1. Although  some of the findings in the lungs appear similar to prior studies and are likely chronic indicative of underlying interstitial lung disease, there is worsening aeration overall, particularly in the right lung, suggesting superimposed multilobar bilateral bronchopneumonia. 2. Cardiomegaly. Electronically Signed   By: Trudie Reed M.D.   On: 12/02/2022 06:59      Signature  -   Susa Raring M.D on 12/02/2022 at 10:40 AM   -  To page go to  [email or web address]

## 2022-12-02 NOTE — Plan of Care (Signed)
Poor appetite refusing mouth care and combative.

## 2022-12-02 NOTE — Progress Notes (Signed)
Patient took two bites of food and stated that is enough no more. Patient swinging and cussing if encouraged to eat more or given him something to drink. Patient did not want to take his oral meds and was hitting and swinging and cussing the nurse. Attempted to explain need for meds but patient not able to understand necessity. Patient is only alert to self.

## 2022-12-02 NOTE — Plan of Care (Signed)
  Problem: Education: Goal: Knowledge of risk factors and measures for prevention of condition will improve Outcome: Progressing   Problem: Coping: Goal: Psychosocial and spiritual needs will be supported Outcome: Progressing   Problem: Respiratory: Goal: Will maintain a patent airway Outcome: Progressing   Problem: Education: Goal: Knowledge of General Education information will improve Description: Including pain rating scale, medication(s)/side effects and non-pharmacologic comfort measures Outcome: Progressing   Problem: Clinical Measurements: Goal: Will remain free from infection Outcome: Progressing Goal: Diagnostic test results will improve Outcome: Progressing Goal: Respiratory complications will improve Outcome: Progressing   Problem: Activity: Goal: Risk for activity intolerance will decrease Outcome: Progressing   Problem: Nutrition: Goal: Adequate nutrition will be maintained Outcome: Progressing   Problem: Safety: Goal: Non-violent Restraint(s) Outcome: Progressing

## 2022-12-02 NOTE — Plan of Care (Signed)
  Problem: Education: Goal: Knowledge of risk factors and measures for prevention of condition will improve Outcome: Progressing   Problem: Coping: Goal: Psychosocial and spiritual needs will be supported Outcome: Progressing   Problem: Respiratory: Goal: Will maintain a patent airway Outcome: Progressing Goal: Complications related to the disease process, condition or treatment will be avoided or minimized Outcome: Progressing   Problem: Education: Goal: Knowledge of General Education information will improve Description: Including pain rating scale, medication(s)/side effects and non-pharmacologic comfort measures Outcome: Progressing   Problem: Health Behavior/Discharge Planning: Goal: Ability to manage health-related needs will improve Outcome: Progressing   Problem: Clinical Measurements: Goal: Will remain free from infection Outcome: Progressing Goal: Diagnostic test results will improve Outcome: Progressing Goal: Respiratory complications will improve Outcome: Progressing   Problem: Activity: Goal: Risk for activity intolerance will decrease Outcome: Progressing   Problem: Coping: Goal: Level of anxiety will decrease Outcome: Progressing   Problem: Elimination: Goal: Will not experience complications related to bowel motility Outcome: Progressing   Problem: Pain Management: Goal: General experience of comfort will improve Outcome: Progressing   Problem: Safety: Goal: Ability to remain free from injury will improve Outcome: Progressing   Problem: Safety: Goal: Non-violent Restraint(s) Outcome: Progressing

## 2022-12-03 ENCOUNTER — Inpatient Hospital Stay (HOSPITAL_COMMUNITY): Payer: Medicare Other

## 2022-12-03 DIAGNOSIS — J1282 Pneumonia due to coronavirus disease 2019: Secondary | ICD-10-CM | POA: Diagnosis not present

## 2022-12-03 DIAGNOSIS — U071 COVID-19: Secondary | ICD-10-CM | POA: Diagnosis not present

## 2022-12-03 LAB — GLUCOSE, CAPILLARY
Glucose-Capillary: 150 mg/dL — ABNORMAL HIGH (ref 70–99)
Glucose-Capillary: 121 mg/dL — ABNORMAL HIGH (ref 70–99)
Glucose-Capillary: 116 mg/dL — ABNORMAL HIGH (ref 70–99)
Glucose-Capillary: 102 mg/dL — ABNORMAL HIGH (ref 70–99)
Glucose-Capillary: 135 mg/dL — ABNORMAL HIGH (ref 70–99)

## 2022-12-03 LAB — CBC WITH DIFFERENTIAL/PLATELET
Abs Immature Granulocytes: 0.02 10*3/uL (ref 0.00–0.07)
Basophils Absolute: 0 10*3/uL (ref 0.0–0.1)
Basophils Relative: 0 %
Eosinophils Absolute: 0 10*3/uL (ref 0.0–0.5)
Eosinophils Relative: 1 %
HCT: 42.6 % (ref 39.0–52.0)
Hemoglobin: 14.2 g/dL (ref 13.0–17.0)
Immature Granulocytes: 0 %
Lymphocytes Relative: 13 %
Lymphs Abs: 0.8 10*3/uL (ref 0.7–4.0)
MCH: 31.1 pg (ref 26.0–34.0)
MCHC: 33.3 g/dL (ref 30.0–36.0)
MCV: 93.4 fL (ref 80.0–100.0)
Monocytes Absolute: 0.4 10*3/uL (ref 0.1–1.0)
Monocytes Relative: 7 %
Neutro Abs: 4.6 10*3/uL (ref 1.7–7.7)
Neutrophils Relative %: 79 %
Platelets: 147 10*3/uL — ABNORMAL LOW (ref 150–400)
RBC: 4.56 MIL/uL (ref 4.22–5.81)
RDW: 15 % (ref 11.5–15.5)
WBC: 5.9 10*3/uL (ref 4.0–10.5)
nRBC: 0 % (ref 0.0–0.2)

## 2022-12-03 LAB — BASIC METABOLIC PANEL
Anion gap: 11 (ref 5–15)
BUN: 30 mg/dL — ABNORMAL HIGH (ref 8–23)
CO2: 31 mmol/L (ref 22–32)
Calcium: 8.7 mg/dL — ABNORMAL LOW (ref 8.9–10.3)
Chloride: 99 mmol/L (ref 98–111)
Creatinine, Ser: 1.2 mg/dL (ref 0.61–1.24)
GFR, Estimated: 59 mL/min — ABNORMAL LOW (ref >60)
Glucose, Bld: 150 mg/dL — ABNORMAL HIGH (ref 70–99)
Potassium: 4.2 mmol/L (ref 3.5–5.1)
Sodium: 141 mmol/L (ref 135–145)

## 2022-12-03 LAB — PROCALCITONIN: Procalcitonin: 0.1 ng/mL

## 2022-12-03 LAB — MAGNESIUM: Magnesium: 2.1 mg/dL (ref 1.7–2.4)

## 2022-12-03 LAB — C-REACTIVE PROTEIN: CRP: 4.6 mg/dL — ABNORMAL HIGH (ref <1.0)

## 2022-12-03 LAB — PHOSPHORUS: Phosphorus: 2.7 mg/dL (ref 2.5–4.6)

## 2022-12-03 LAB — BRAIN NATRIURETIC PEPTIDE: B Natriuretic Peptide: 1115.5 pg/mL — ABNORMAL HIGH (ref 0.0–100.0)

## 2022-12-03 MED ORDER — PANTOPRAZOLE SODIUM 40 MG PO TBEC
40.0000 mg | DELAYED_RELEASE_TABLET | Freq: Every day | ORAL | Status: DC
  Administered 2022-12-03 – 2022-12-04 (×2): 40 mg via ORAL
  Filled 2022-12-03: qty 1

## 2022-12-03 MED ORDER — ADULT MULTIVITAMIN W/MINERALS CH
1.0000 | ORAL_TABLET | Freq: Every day | ORAL | Status: DC
  Administered 2022-12-03 – 2022-12-04 (×2): 1 via ORAL
  Filled 2022-12-03 (×2): qty 1

## 2022-12-03 MED ORDER — FUROSEMIDE 10 MG/ML IJ SOLN
20.0000 mg | Freq: Once | INTRAMUSCULAR | Status: AC
  Administered 2022-12-03: 20 mg via INTRAVENOUS
  Filled 2022-12-03: qty 2

## 2022-12-03 NOTE — Progress Notes (Signed)
Speech Language Pathology Treatment: Dysphagia  Patient Details Name: Robert Carpenter MRN: [id number] DOB: 09/03/1933 Today's Date: 12/03/2022 Time: 9563-8756 SLP Time Calculation (min) (ACUTE ONLY): 16 min  Assessment / Plan / Recommendation Clinical Impression  Upon SLP arrival, pt self-feeding from his meal tray of Dys 1 textures with honey thick liquids. He participated throughout trials of thin liquids, honey thick liquids, and pureed solids. Liquids of any consistency result in immediate and delayed coughing. Pt's respiratory status appears to have improved, SpO2 remaining steadily ~98-100% throughout all trials on room air. Feel that pt's performance this date warrants completion of an MBS to inform further decisions regarding least restrictive diet prior to d/c. Will f/u to complete an MBS as scheduling allows.    HPI HPI: 89 year old man who is critically ill due to acute hypoxic and hypercarbic respiratory failure requiring NIV due to COVID pneumonia.   He is SNF-bound at baseline, HFrEF and chronic Afib. He has baseline disorientation but is normally communicative. Pt has a history of cardiac arrest, trach and PEG to recover, now removed. Has has several MBS, the last on 01/10/22 that showed mild residue that clears with a second swallow, no penetration or aspiration. He has had recurrent aspiration pna however. Recommended to consume Mech soft/thin liquids due to impairment in mastication.      SLP Plan  MBS      Recommendations for follow up therapy are one component of a multi-disciplinary discharge planning process, led by the attending physician.  Recommendations may be updated based on patient status, additional functional criteria and insurance authorization.    Recommendations  Diet recommendations: Dysphagia 1 (puree);Honey-thick liquid Liquids provided via: Straw;Cup Medication Administration: Crushed with puree Supervision: Full supervision/cueing for compensatory  strategies Compensations: Slow rate;Small sips/bites Postural Changes and/or Swallow Maneuvers: Seated upright 90 degrees                  Oral care QID   Frequent or constant Supervision/Assistance Dysphagia, unspecified (R13.10)     MBS     Gwynneth Aliment, M.A., CF-SLP Speech Language Pathology, Acute Rehabilitation Services  Secure Chat preferred [phone]   12/03/2022, 10:01 AM

## 2022-12-03 NOTE — Progress Notes (Signed)
Modified Barium Swallow Study  Patient Details  Name: Robert Carpenter MRN: [id number] Date of Birth: 01-18-1933  Today's Date: 12/03/2022  Modified Barium Swallow completed.  Full report located under Chart Review in the Imaging Section.  History of Present Illness 89 year old man who is critically ill due to acute hypoxic and hypercarbic respiratory failure requiring NIV due to COVID pneumonia. He is SNF-bound at baseline, HFrEF and chronic Afib. He has baseline disorientation but is normally communicative. Pt has a history of cardiac arrest, trach and PEG to recover, now removed. Has has several MBS, the last on 01/10/22 that showed mild residue that clears with a second swallow, no penetration or aspiration. He has had recurrent aspiration pna however. Recommended to consume Mech soft/thin liquids due to impairment in mastication.   Clinical Impression Pt presents with a significant oropharyngeal dysphagia characterized by impaired strength, timing, and control. Pt's cognition further impacts structural deficits resulting in posterior spillage of boluses prior to swallow initiation. This, in addition to incomplete anterior hyoid burst and subsequent deficient laryngeal vestibule closure, results in silent aspiration (PAS 8) of thin liquids and eventual sensed aspiration of nectar thick liquids (PAS 7). Limited trials of honey thick liquids and purees are penetrated above the vocal folds and only intermittently cleared. Pt has substantial residue that is not effectively cleared by unsuccessful attempts at subswallows given pt's base of tongue weakness, absent epiglottic inversion, and decreased pharyngeal stripping wave. Pt's velopharyngeal closure is incomplete, allowing boluses to consistently enter his nasal cavity. He was unable to follow commands to swallow, cough, or perform any additional compensatory strategies. Pt's deficits are severely exacerbated since prior [facility] 01/2022. His intake has been  poor throughout this admission and he is at risk of aspiration given any texture due to diffuse oropharyngeal residue. Discussed extensively with pt's son and MD. Recommend engaging PMT for ongoing conversations regarding GOC. Pending these decisions, recommend continuing current diet of Dys 1 texture solids with honey thick liquids when pt is fully awake, alert, and accepting. Do not anticipate pt achieving adequate oral intake. Will continue to follow.  Factors that may increase risk of adverse event in presence of aspiration [employer] 2021): Poor general health and/or compromised immunity;Reduced cognitive function;Limited mobility;Frail or deconditioned;Dependence for feeding and/or oral hygiene;Inadequate oral hygiene;Weak cough;Aspiration of thick, dense, and/or acidic materials  Swallow Evaluation Recommendations Recommendations: PO diet PO Diet Recommendation: Dysphagia 1 (Pureed);Moderately thick liquids (Level 3, honey thick) Liquid Administration via: Cup;Straw;Spoon Medication Administration: Crushed with puree Supervision: Full assist for feeding;Full supervision/cueing for swallowing strategies Swallowing strategies  : Minimize environmental distractions;Slow rate;Small bites/sips;Check for pocketing or oral holding Postural changes: Position pt fully upright for meals;Stay upright 30-60 min after meals Oral care recommendations: Oral care QID (4x/day);Staff/trained caregiver to provide oral care;Use suctioning for oral care Recommended consults: Consider Palliative care Caregiver Recommendations: Avoid jello, ice cream, thin soups, popsicles;Remove water pitcher;Have oral suction available    Gwynneth Aliment, M.A., CF-SLP Speech Language Pathology, Acute Rehabilitation Services  Secure Chat preferred [phone]  12/03/2022,5:01 PM

## 2022-12-03 NOTE — Progress Notes (Signed)
Nutrition Follow-up  DOCUMENTATION CODES:   Underweight, Severe malnutrition in context of chronic illness  INTERVENTION:  Advance diet per SLP and MD. Magic cup TID with meals, each supplement provides 290 kcal and 9 grams of protein Multi vitamin with minerals.   NUTRITION DIAGNOSIS:   Severe Malnutrition related to chronic illness (chronic dysphagia, CHF, interstitial lung disease) as evidenced by severe fat depletion, severe muscle depletion.    GOAL:   Patient will meet greater than or equal to 90% of their needs    MONITOR:   Diet advancement, Labs, Weight trends, TF tolerance  REASON FOR ASSESSMENT:   Consult Enteral/tube feeding initiation and management  ASSESSMENT:   89 year old male who presented to the ED from SNF on 11/23 with AMS. PMH of atrial fibrillation, HTN, DVT, CHF, interstitial lung disease, dysphagia, aspiration pneumonia, progressive cognitive decline and suspected undiagnosed dementia, prior cardiac arrest, prolonged intubation requiring trach and PEG which have now been d/c. Pt admitted with acute respiratory failure with hypoxia and hypercarbia due to COVID-19 pneumonia requiring BiPAP.  Patient resting. Was non verbal to RD on this day.  Team reports poor oral intake.SLP following with recommendation for MBS.  COVID positive 11/23.   Significant Events: 11/23>> from SNF-confusion-COVID-positive-admit to [facility].   11/24>> transferred to ICU 11/26>> interventional radiology placed core track 11/27>> patient removed core track overnight with concerns for aspiration event during dislodgment 11/29>> transferred to [facility] weight history;  Date/Time Weight Weight in lbs  12/03/22 0434 63.1 kg 139.11 lbs  12/02/22 0500 63.2 kg 139.33 lbs  12/01/22 0426 63.4 kg 139.77 lbs  11/29/22 0457 59.4 kg 130.95 lbs  11/28/22 0500 59.2 kg 130.51 lbs  11/25/22 1100 59.2 kg 130.51 lbs  11/25/22 0300 59.2 kg 130.51 lbs   Last Weight  Most recent  update: 12/03/2022  4:34 AM    Weight  63.1 kg (139 lb 1.8 oz)             Average Meal Intake: 0-45: 14% intake x 6 recorded meals  Nutritionally Relevant Medications: Scheduled Meds:  (feeding supplement) PROSource Plus  30 mL Oral BID BM   apixaban  2.5 mg Oral BID   QUEtiapine  25 mg Oral BID   sertraline  25 mg Oral Daily   tamsulosin  0.4 mg Oral Daily   traZODone  50 mg Oral QHS    Continuous Infusions: PRN Meds:.albuterol, bisacodyl, iohexol, labetalol, mouth rinse  Labs Reviewed:  CBG ranges from 125-150 mg/dL over the last 24 hours     NUTRITION - FOCUSED PHYSICAL EXAM:  Flowsheet Row Most Recent Value  Orbital Region Severe depletion  Upper Arm Region Severe depletion  Thoracic and Lumbar Region Severe depletion  Buccal Region Unable to assess  [BiPAP in place]  Temple Region Moderate depletion  Clavicle Bone Region Severe depletion  Clavicle and Acromion Bone Region Severe depletion  Scapular Bone Region Severe depletion  Dorsal Hand Severe depletion  Patellar Region Severe depletion  Anterior Thigh Region Severe depletion  Posterior Calf Region Severe depletion  Edema (RD Assessment) None  Hair Reviewed  Eyes Reviewed  Mouth Reviewed  Skin Reviewed  Nails Reviewed       Diet Order:   Diet Order             DIET - DYS 1 Fluid consistency: Honey Thick  Diet effective now                   EDUCATION NEEDS:  Not appropriate for education at this time  Skin:  Skin Assessment: Reviewed RN Assessment  Last BM:  12/1  Height:   Ht Readings from Last 1 Encounters:  11/25/22 6\' 2"  (1.88 m)    Weight:   Wt Readings from Last 1 Encounters:  12/03/22 63.1 kg    Ideal Body Weight:     BMI:  Body mass index is 17.86 kg/m.  Estimated Nutritional Needs:   Kcal:  1800-2000  Protein:  80-95 grams  Fluid:  >1.8 L    Jamelle Haring RDN, LDN Clinical Dietitian  RDN pager # available on Amion

## 2022-12-03 NOTE — Discharge Instructions (Signed)

## 2022-12-03 NOTE — Progress Notes (Signed)
PROGRESS NOTE                                                                                                                                                                                                             Patient Demographics:    Robert Carpenter, is a 89 y.o. male, DOB - 1933/01/08, VZD:[id number]  Outpatient Primary MD for the patient is Shayne Alken, MD    LOS - 9  Admit date - 11/24/2022    Chief Complaint  Patient presents with   Altered Mental Status       Brief Narrative (HPI from H&P)   Robert Carpenter is an 89 y.o. male past medical history of severe COVID infection in 2021 with a prolonged VT arrest trach and PEG status post decannulation and PEG removal with residual fibrosis, chronic atrial fibrillation, chronic diastolic heart failure dementia comes into the hospital with confusion complicated by hypercapnia not tolerated BiPAP subsequently abated transferred back to [facility] on 11/30/2022   Significant Events: 11/23>> from SNF-confusion-COVID-positive-admit to [facility].   11/24>> transferred to ICU 11/26>> interventional radiology placed core track 11/27>> patient removed core track overnight with concerns for aspiration event during dislodgment 11/29>> transferred to [facility]   Significant studies: 11/23>> CT head: No acute finding 11/23>> CT angio chest: No PE, interstitial lung disease with small bilateral pleural effusions.     Antibiotics: None   Microbiology data: 11/23>> COVID PCR: Positive 11/23>> influenza/RSV PCR: Negative 11/23>> blood culture: Negative 11/24>> blood culture: Negative   Procedures: None   Subjective:    Robert Carpenter today has, No headache, No chest pain, No abdominal pain - No Nausea, No new weakness tingling or numbness, improved SOB.   Assessment  & Plan :    Acute respiratory failure with hypoxia and hypercarbia secondary to Pneumonia due to  COVID-19 virus/septic shock secondary to COVID-19 pneumonia and possibly aspiration pneumonia: Sepsis physiology has resolved has been weaned off pressors. Completed course of remdesivir and Rocephin. Placed him back on his home dose steroid of 10 mg prednisone. Dysphagia 1 diet. He has now been weaned to room air.  If stable then likely discharge on 12/04/2022.   Acute metabolic encephalopathy superimposed on dementia: Etiology multifactorial likely due to hypoxia hypercarbia and sepsis much improved. Continue delirium precautions.  Continue Seroquel twice daily.   Chronic combined  diastolic and systolic heart failure: Lasix as needed. Appears euvolemic.   Chronic atrial fibrillation/A-flutter with A-fib: Rate controlled continue beta-blocker and Eliquis.   Essential hypertension: Continue metoprolol, adjust dose for better control   Mild thrombocytopenia: Likely due to infectious etiology.   Acute urinary retention: Continue Pravachol and Flomax. Hopefully voiding trial tomorrow.   Oropharyngeal dysphagia: Speech evaluated the patient now on a dysphagia 1 diet.   History of severe COVID-19 infection in 2021 with prolonged vent requirement trach and PEG (decannulated and PEG removed)/now with pulmonary fibrosis: Noted has been weaned to room air.   Palliative care: Family wants to continue full scope of treatment.   Debility/deconditioning: PT OT evaluated the patient recommended short-term rehab. Awaiting rehab placement.   Severe protein caloric malnutrition: Placed on protein supplementation oral intake is poor, gentle D5 for a day or 2.         Condition - Extremely Guarded  Family Communication  : Called son [name] [phone] on 12/03/2022 at 8:32 AM and message left on his cell phone  Code Status :  Full  Consults  :    PUD Prophylaxis :  PPI   Procedures  :            Disposition Plan  :    Status is: Inpatient  DVT Prophylaxis  :     apixaban (ELIQUIS) tablet 2.5 mg Start: 11/30/22 1415 apixaban (ELIQUIS) tablet 2.5 mg      Lab Results  Component Value Date   PLT 147 (L) 12/03/2022    Diet :  Diet Order             DIET - DYS 1 Fluid consistency: Honey Thick  Diet effective now                    Inpatient Medications  Scheduled Meds:  (feeding supplement) PROSource Plus  30 mL Oral BID BM   apixaban  2.5 mg Oral BID   Chlorhexidine Gluconate Cloth  6 each Topical Daily   furosemide  20 mg Intravenous Once   metoprolol tartrate  100 mg Oral BID   OLANZapine zydis  5 mg Oral Once   mouth rinse  15 mL Mouth Rinse 4 times per day   pantoprazole  40 mg Oral Daily   [START ON 12/05/2022] predniSONE  10 mg Oral Q breakfast   QUEtiapine  25 mg Oral BID   sertraline  25 mg Oral Daily   tamsulosin  0.4 mg Oral Daily   traZODone  50 mg Oral QHS   Continuous Infusions:  dextrose 50 mL/hr at 12/03/22 0657   PRN Meds:.albuterol, bisacodyl, iohexol, labetalol, mouth rinse    Objective:   Vitals:   12/02/22 2346 12/03/22 0035 12/03/22 0400 12/03/22 0434  BP: (!) 133/108 (!) 141/97 (!) 133/99   Pulse: 100 93 95   Resp: 15 18 17    Temp: (!) 97.4 F (36.3 C)  97.6 F (36.4 C)   TempSrc: Axillary  Oral   SpO2: (!) 89%  100%   Weight:    63.1 kg  Height:        Wt Readings from Last 3 Encounters:  12/03/22 63.1 kg  07/18/21 78.6 kg  01/21/21 69 kg     Intake/Output Summary (Last 24 hours) at 12/03/2022 0834 Last data filed at 12/02/2022 1900 Gross per 24 hour  Intake 70 ml  Output 1200 ml  Net -1130 ml     Physical Exam  Awake but  confused, No new F.N deficits, Normal affect [place].AT,PERRAL Supple Neck, No JVD,   Symmetrical Chest wall movement, Good air movement bilaterally, CTAB RRR,No Gallops,Rubs or new Murmurs,  +ve B.Sounds, Abd Soft, No tenderness,   No Cyanosis, Clubbing or edema     Data Review:    Recent Labs  Lab 11/27/22 0400 11/28/22 0836 11/29/22 0703  11/30/22 0450 12/02/22 0620 12/03/22 0517  WBC 9.2  --  12.1* 9.3 7.6 5.9  HGB 12.9* 14.6 14.9 14.5 14.8 14.2  HCT 38.8* 43.0 44.8 44.0 44.2 42.6  PLT 107*  --  98* 131* 142* 147*  MCV 96.5  --  95.3 93.6 95.3 93.4  MCH 32.1  --  31.7 30.9 31.9 31.1  MCHC 33.2  --  33.3 33.0 33.5 33.3  RDW 15.7*  --  15.4 15.1 15.3 15.0  LYMPHSABS  --   --   --   --  0.8 0.8  MONOABS  --   --   --   --  0.4 0.4  EOSABS  --   --   --   --  0.0 0.0  BASOSABS  --   --   --   --  0.0 0.0    Recent Labs  Lab 11/26/22 1133 11/26/22 1237 11/27/22 0954 11/27/22 1634 11/28/22 0824 11/28/22 0836 11/28/22 1807 11/30/22 0450 12/02/22 0620 12/02/22 0956 12/03/22 0517  NA  --    < > 145  --  143 141  --  140 142  --  141  K  --    < > 4.0  --  4.6 3.6  --  4.6 4.5  --  4.2  CL  --   --  107  --  105  --   --  100 103  --  99  CO2  --   --  31  --  23  --   --  31 26  --  31  ANIONGAP  --   --  7  --  15  --   --  9 13  --  11  GLUCOSE  --   --  190*  --  108*  --   --  109* 125*  --  150*  BUN  --   --  17  --  19  --   --  20 31*  --  30*  CREATININE  --   --  0.93  --  0.98  --   --  1.06 1.04  --  1.20  AST  --   --  33  --   --   --   --   --   --   --   --   ALT  --   --  36  --   --   --   --   --   --   --   --   ALKPHOS  --   --  41  --   --   --   --   --   --   --   --   BILITOT  --   --  0.7  --   --   --   --   --   --   --   --   ALBUMIN  --   --  2.3*  --   --   --   --   --   --   --   --  CRP  --   --   --   --   --   --   --   --  6.7*  --  4.6*  PROCALCITON  --   --   --   --   --   --   --   --   --  0.10 0.10  LATICACIDVEN 1.2  --   --   --   --   --   --   --   --   --   --   BNP 378.7*  --   --   --   --   --   --   --  939.5*  --  1,115.5*  MG  --    < >  --    < > 1.9  --  1.9 1.9 1.9  --  2.1  CALCIUM  --   --  8.2*  --  8.5*  --   --  8.7* 8.8*  --  8.7*   < > = values in this interval not displayed.      Recent Labs  Lab 11/26/22 1133 11/27/22 0400 11/27/22 0954  11/27/22 1634 11/28/22 0824 11/28/22 1807 11/30/22 0450 12/02/22 0620 12/02/22 0956 12/03/22 0517  CRP  --   --   --   --   --   --   --  6.7*  --  4.6*  PROCALCITON  --   --   --   --   --   --   --   --  0.10 0.10  LATICACIDVEN 1.2  --   --   --   --   --   --   --   --   --   BNP 378.7*  --   --   --   --   --   --  939.5*  --  1,115.5*  MG  --    < >  --    < > 1.9 1.9 1.9 1.9  --  2.1  CALCIUM  --   --  8.2*  --  8.5*  --  8.7* 8.8*  --  8.7*   < > = values in this interval not displayed.    Radiology Reports DG Chest Port 1 View  Result Date: 12/02/2022 CLINICAL DATA:  89 year old male with history of shortness of breath. EXAM: PORTABLE CHEST 1 VIEW COMPARISON:  Chest x-ray 11/27/2022. FINDINGS: Lung volumes are low. Diffuse interstitial prominence and widespread patchy ill-defined opacities scattered throughout the lungs bilaterally with relative sparing of the left upper lobe. Overall, aeration appears similar to slightly worsened compared to the prior study. No pleural effusions. No pneumothorax. No evidence of pulmonary edema. Heart size is mildly enlarged. The patient is rotated to the upper mediastinal contours are distorted by patient positioning. Prominence of soft tissues in the high left paratracheal region corresponding to asymmetric substernal goiter demonstrated on prior chest CT. On today's exam, resulting in distortion of the mediastinal contours and reduced diagnostic sensitivity and specificity for mediastinal pathology. IMPRESSION: 1. Although some of the findings in the lungs appear similar to prior studies and are likely chronic indicative of underlying interstitial lung disease, there is worsening aeration overall, particularly in the right lung, suggesting superimposed multilobar bilateral bronchopneumonia. 2. Cardiomegaly. Electronically Signed   By: Trudie Reed M.D.   On: 12/02/2022 06:59      Signature  -   Susa Raring M.D on 12/03/2022 at 8:34 AM   -  To  page go to [email or web address]

## 2022-12-03 NOTE — TOC Progression Note (Addendum)
Transition of Care [facility]) - Progression Note    Patient Details  Name: Robert Carpenter MRN: [id number] Date of Birth: [birth year]  [facility] CM/SW Contact  [name], LCSW Phone Number: 12/03/2022, 3:01 PM  Clinical Narrative:    3pm-CSW left HIPAA compliant voicemail for patient's son updating him on discharge timeline for tomorrow potentially per MD. CSW notified [facility] as well.    3:17 PM-CSW received return call from patient's son expressing concern at discharge so soon. He would like to wait on the results of the MBS and re-discuss timing with team as he does not want patient to leave until he is strong enough.    Expected Discharge Plan: Skilled Nursing Facility Barriers to Discharge: Continued Medical Work up  Expected Discharge Plan and Services In-house Referral: Clinical Social Work   Post Acute Care Choice: Skilled Nursing Facility Living arrangements for the past 2 months: Skilled Nursing Facility                                       Social Determinants of Health (SDOH) Interventions SDOH Screenings   Food Insecurity: Patient Unable To Answer (11/27/2022)  Housing: Patient Unable To Answer (11/27/2022)  Transportation Needs: Patient Unable To Answer (11/27/2022)  Utilities: Patient Unable To Answer (11/27/2022)  Financial Resource Strain: Low Risk  (12/23/2019)   Received from [facility]  Tobacco Use: Medium Risk (11/24/2022)    Readmission Risk Interventions     No data to display

## 2022-12-04 DIAGNOSIS — J1282 Pneumonia due to coronavirus disease 2019: Secondary | ICD-10-CM | POA: Diagnosis not present

## 2022-12-04 DIAGNOSIS — U071 COVID-19: Secondary | ICD-10-CM | POA: Diagnosis not present

## 2022-12-04 LAB — GLUCOSE, CAPILLARY
Glucose-Capillary: 102 mg/dL — ABNORMAL HIGH (ref 70–99)
Glucose-Capillary: 104 mg/dL — ABNORMAL HIGH (ref 70–99)
Glucose-Capillary: 153 mg/dL — ABNORMAL HIGH (ref 70–99)
Glucose-Capillary: 66 mg/dL — ABNORMAL LOW (ref 70–99)
Glucose-Capillary: 74 mg/dL (ref 70–99)
Glucose-Capillary: 85 mg/dL (ref 70–99)

## 2022-12-04 MED ORDER — TAMSULOSIN HCL 0.4 MG PO CAPS: 0.4000 mg | ORAL_CAPSULE | Freq: Every day | ORAL | Status: DC

## 2022-12-04 MED ORDER — APIXABAN 5 MG PO TABS: 5.0000 mg | ORAL_TABLET | Freq: Two times a day (BID) | ORAL | Status: DC

## 2022-12-04 MED ORDER — QUETIAPINE FUMARATE 25 MG PO TABS: 25.0000 mg | ORAL_TABLET | Freq: Two times a day (BID) | ORAL | Status: DC

## 2022-12-04 MED ORDER — PANTOPRAZOLE SODIUM 40 MG PO TBEC: 40.0000 mg | DELAYED_RELEASE_TABLET | Freq: Every day | ORAL | Status: DC

## 2022-12-04 MED ORDER — DEXTROSE 50 % IV SOLN
50.0000 mL | Freq: Once | INTRAVENOUS | Status: AC
  Administered 2022-12-04: 50 mL via INTRAVENOUS
  Filled 2022-12-04: qty 50

## 2022-12-04 MED ORDER — DEXTROSE 50 % IV SOLN
25.0000 mL | Freq: Once | INTRAVENOUS | Status: AC
  Administered 2022-12-04: 25 mL via INTRAVENOUS
  Filled 2022-12-04: qty 50

## 2022-12-04 MED ORDER — APIXABAN 2.5 MG PO TABS: 2.5000 mg | ORAL_TABLET | Freq: Two times a day (BID) | ORAL | Status: DC

## 2022-12-04 MED ORDER — METOPROLOL TARTRATE 100 MG PO TABS: 100.0000 mg | ORAL_TABLET | Freq: Two times a day (BID) | ORAL | Status: DC

## 2022-12-04 NOTE — Progress Notes (Signed)
Attempted to call nurse at [facility] healthcare. Asked me to call back in an hour. I will try to call back in 15 mins

## 2022-12-04 NOTE — TOC Progression Note (Addendum)
Transition of Care [facility]) - Progression Note    Patient Details  Name: Robert Carpenter MRN: [id number] Date of Birth: July 31, 1933  Transition of Care [facility]) CM/SW Contact  [name], LCSW Phone Number: 12/04/2022, 11:33 AM  Clinical Narrative:    10:20am-CSW spoke with son and confirmed he was able to speak with Speech regarding MBS. Son stated his boss was calling and he needed to call CSW back. CSW did not get to let him know that patient has been discharged. MD attempted to call son this morning.   11:39 AM-CSW still has not received call back from son so CSW sent HIPAA compliant text to make him aware of discharge (son had initiated text conversation earlier in patient's stay and confirmed he received texts) and to contact the RN for medical care questions. Awaiting call back.    1:33 PM-CSW left voicemail for son.   3:38pm-CSW sent another text to son directing him to contact the nursing station if he had any questions. CSW left voicemail for patient's sister.  3:59 PM-CSW received call from patient's son. CSW answered questions and he stated understanding that patient would be discharged via [facility] though he did express that he does not feel patient looks good but better than when he arrived.      Expected Discharge Plan: Skilled Nursing Facility Barriers to Discharge: Barriers Resolved  Expected Discharge Plan and Services In-house Referral: Clinical Social Work   Post Acute Care Choice: Skilled Nursing Facility Living arrangements for the past 2 months: Skilled Nursing Facility Expected Discharge Date: 12/04/22                                     Social Determinants of Health (SDOH) Interventions SDOH Screenings   Food Insecurity: Patient Unable To Answer (11/27/2022)  Housing: Patient Unable To Answer (11/27/2022)  Transportation Needs: Patient Unable To Answer (11/27/2022)  Utilities: Patient Unable To Answer (11/27/2022)  Financial Resource Strain:  Low Risk  (12/23/2019)   Received from [facility]  Tobacco Use: Medium Risk (11/24/2022)    Readmission Risk Interventions     No data to display

## 2022-12-04 NOTE — Progress Notes (Signed)
Change apixaban back to 5mg  PO BID now that AKI has resolved per Dr. Thedore Mins.  Ulyses Southward, PharmD, BCIDP, AAHIVP, CPP Infectious Disease Pharmacist 12/04/2022 10:59 AM

## 2022-12-04 NOTE — Discharge Summary (Signed)
KEEN BOSO VWU:[id number] DOB: 1933-03-04 DOA: 11/24/2022  PCP: Shayne Alken, MD  Admit date: 11/24/2022  Discharge date: 12/04/2022  Admitted From: SNF   Disposition:  SNF   Recommendations for Outpatient Follow-up:   Follow up with PCP in 1-2 weeks  PCP Please obtain BMP/CBC, 2 view CXR in 1week,  (see Discharge instructions)   PCP Please follow up on the following pending results:    Home Health: None   Equipment/Devices: None  Consultations: None  Discharge Condition: Stable    CODE STATUS: Full    Diet Recommendation: Dysphagia 1 diet, honey thick liquid.  Full feeding assistance and aspiration precautions.  All meals sitting up in bed.  1.5 L fluid restriction per day.    Chief Complaint  Patient presents with   Altered Mental Status     Brief history of present illness from the day of admission and additional interim summary    89 y.o. male past medical history of severe COVID infection in 2021 with a prolonged VT arrest trach and PEG status post decannulation and PEG removal with residual fibrosis, chronic atrial fibrillation, chronic diastolic heart failure dementia comes into the hospital with confusion complicated by hypercapnia not tolerated BiPAP subsequently abated transferred back to [facility] on 11/30/2022   Significant Events: 11/23>> from SNF-confusion-COVID-positive-admit to [facility].   11/24>> transferred to ICU 11/26>> interventional radiology placed core track 11/27>> patient removed core track overnight with concerns for aspiration event during dislodgment 11/29>> transferred to [facility]   Significant studies: 11/23>> CT head: No acute finding 11/23>> CT angio chest: No PE, interstitial lung disease with small bilateral pleural effusions.     Antibiotics: None    Microbiology data: 11/23>> COVID PCR: Positive 11/23>> influenza/RSV PCR: Negative 11/23>> blood culture: Negative 11/24>> blood culture: Negative   Procedures: None                                                                Hospital Course    Acute respiratory failure with hypoxia and hypercarbia secondary to Pneumonia due to COVID-19 virus/septic shock secondary to COVID-19 pneumonia and possibly aspiration pneumonia: Sepsis physiology has resolved has been weaned off pressors. Completed course of remdesivir and Rocephin. Placed him back on his home dose steroid of 10 mg prednisone. Dysphagia 1 diet.  He is extremely frail at baseline and he remains at high risk for aspiration. He has now been weaned to room air.  If stable then likely discharge on 12/04/2022.   Acute metabolic encephalopathy superimposed on dementia: Etiology multifactorial likely due to hypoxia hypercarbia and sepsis much improved. Continue delirium precautions.  Continue Seroquel twice daily.  Patient much improved.   Chronic combined diastolic and systolic heart failure: Lasix as needed to be used at [facility]. Appears euvolemic.   Chronic atrial fibrillation/A-flutter with  A-fib: Rate controlled continue beta-blocker and Eliquis.   Essential hypertension: Continue metoprolol, blood pressure stable, monitor at SNF and adjust as needed.   Mild thrombocytopenia: Likely due to infectious etiology.  Monitor CBC intermittently at SNF.   Acute urinary retention: Continue Pravachol and Flomax. Foley catheter removal today, follow bladder emptying at SNF.   Oropharyngeal dysphagia: Speech evaluated the patient now on a dysphagia 1 diet.   History of severe COVID-19 infection in 2021 with prolonged vent requirement trach and PEG (decannulated and PEG removed)/now with pulmonary fibrosis: Noted has been weaned to room air.   Palliative care: Family wants to continue full scope of treatment.    Debility/deconditioning: PT OT evaluated the patient recommended short-term rehab. Awaiting rehab placement.   Severe protein caloric malnutrition: Placed on protein supplementation oral intake is poor.  He continued to have very poor oral intake, his son was called and updated by me on 12/04/2022, told him clearly that patient's oral intake is extremely poor and is likely nearing the end of life, told him that his risk of aspiration and getting dehydrated is very high and to consider comfort measures.    Discharge diagnosis     Principal Problem:   Pneumonia due to COVID-19 virus Active Problems:   Acute respiratory failure with hypoxia and hypercapnia (HCC)   Hypoxic respiratory failure (HCC)   Hypercarbia   Atrial flutter with rapid ventricular response (HCC)   Acute encephalopathy   Neurocognitive deficits   Interstitial lung disease (HCC)   Altered mental status   Protein-calorie malnutrition, severe    Discharge instructions    Discharge Instructions     Discharge instructions   Complete by: As directed    Follow with Primary MD Shayne Alken, MD in 7 days   Get CBC, CMP, 2 view Chest X ray -  checked next visit with your primary MD or SNF MD   Activity: As tolerated with Full fall precautions use walker/cane & assistance as needed  Disposition SNF  Diet: Dysphagia 1 diet-honey thick liquids with feeding assistance and aspiration precautions.  1.5 L fluid restriction per day.  Special Instructions: If you have smoked or chewed Tobacco  in the last 2 yrs please stop smoking, stop any regular Alcohol  and or any Recreational drug use.  On your next visit with your primary care physician please Get Medicines reviewed and adjusted.  Please request your Prim.MD to go over all Hospital Tests and Procedure/Radiological results at the follow up, please get all Hospital records sent to your Prim MD by signing hospital release before you go home.  If you  experience worsening of your admission symptoms, develop shortness of breath, life threatening emergency, suicidal or homicidal thoughts you must seek medical attention immediately by calling 911 or calling your MD immediately  if symptoms less severe.  You Must read complete instructions/literature along with all the possible adverse reactions/side effects for all the Medicines you take and that have been prescribed to you. Take any new Medicines after you have completely understood and accpet all the possible adverse reactions/side effects.   Do not drive when taking Pain medications.  Do not take more than prescribed Pain, Sleep and Anxiety Medications   Increase activity slowly   Complete by: As directed        Discharge Medications   Allergies as of 12/04/2022   No Known Allergies      Medication List     STOP taking these medications  diclofenac Sodium 1 % Gel Commonly known as: VOLTAREN   famotidine 20 MG tablet Commonly known as: PEPCID   Fish Oil 1000 MG Caps   losartan 25 MG tablet Commonly known as: COZAAR   melatonin 3 MG Tabs tablet       TAKE these medications    acetaminophen 325 MG tablet Commonly known as: TYLENOL Take 650 mg by mouth every 6 (six) hours as needed for headache, fever or mild pain.   amantadine 100 MG capsule Commonly known as: SYMMETREL Take 100 mg by mouth daily.   apixaban 2.5 MG Tabs tablet Commonly known as: ELIQUIS Take 1 tablet (2.5 mg total) by mouth 2 (two) times daily. What changed:  medication strength how much to take   Breo Ellipta 200-25 MCG/ACT Aepb Generic drug: fluticasone furoate-vilanterol Inhale 1 puff into the lungs daily.   ipratropium-albuterol 0.5-2.5 (3) MG/3ML Soln Commonly known as: DUONEB Take 3 mLs by nebulization in the morning, at noon, and at bedtime.   metoprolol tartrate 100 MG tablet Commonly known as: LOPRESSOR Take 1 tablet (100 mg total) by mouth 2 (two) times daily.    mirtazapine 7.5 MG tablet Commonly known as: REMERON Take 7.5 mg by mouth at bedtime.   multivitamin with minerals Tabs tablet Take 1 tablet by mouth daily.   pantoprazole 40 MG tablet Commonly known as: PROTONIX Take 1 tablet (40 mg total) by mouth daily.   predniSONE 10 MG tablet Commonly known as: DELTASONE Take 10 mg by mouth daily.   QUEtiapine 25 MG tablet Commonly known as: SEROQUEL Take 1 tablet (25 mg total) by mouth 2 (two) times daily.   sennosides-docusate sodium 8.6-50 MG tablet Commonly known as: SENOKOT-S Take 2 tablets by mouth in the morning and at bedtime.   sertraline 50 MG tablet Commonly known as: ZOLOFT Take 25 mg by mouth daily.   tamsulosin 0.4 MG Caps capsule Commonly known as: FLOMAX Take 1 capsule (0.4 mg total) by mouth daily.   traZODone 50 MG tablet Commonly known as: DESYREL Take 50 mg by mouth at bedtime.   Ventolin HFA 108 (90 Base) MCG/ACT inhaler Generic drug: albuterol Inhale 1 puff into the lungs in the morning, at noon, and at bedtime.         Follow-up Information     Shayne Alken, MD. Schedule an appointment as soon as possible for a visit in 1 week(s).   Contact information: [address] [place] [address] [phone]                 Major procedures and Radiology Reports - PLEASE review detailed and final reports thoroughly  -     DG Swallowing Func-Speech Pathology  Result Date: 12/03/2022 Table formatting from the original result was not included. Modified Barium Swallow Study Patient Details Name: DALE ZBOROWSKI MRN: [id number] Date of Birth: October 09, 1933 Today's Date: 12/03/2022 HPI/PMH: HPI: 89 year old man who is critically ill due to acute hypoxic and hypercarbic respiratory failure requiring NIV due to COVID pneumonia. He is SNF-bound at baseline, HFrEF and chronic Afib. He has baseline disorientation but is normally communicative. Pt has a history of cardiac arrest, trach and PEG to  recover, now removed. Has has several MBS, the last on 01/10/22 that showed mild residue that clears with a second swallow, no penetration or aspiration. He has had recurrent aspiration pna however. Recommended to consume Mech soft/thin liquids due to impairment in mastication. Clinical Impression: Clinical Impression: Pt presents with a  significant oropharyngeal dysphagia characterized by impaired strength, timing, and control. Pt's cognition further impacts structural deficits resulting in posterior spillage of boluses prior to swallow initiation. This, in addition to incomplete anterior hyoid burst and subsequent deficient laryngeal vestibule closure, results in silent aspiration (PAS 8) of thin liquids and eventual sensed aspiration of nectar thick liquids (PAS 7). Limited trials of honey thick liquids and purees are penetrated above the vocal folds and only intermittently cleared. Pt has substantial residue that is not effectively cleared by unsuccessful attempts at subswallows given pt's base of tongue weakness, absent epiglottic inversion, and decreased pharyngeal stripping wave. Pt's velopharyngeal closure is incomplete, allowing boluses to consistently enter his nasal cavity. He was unable to follow commands to swallow, cough, or perform any additional compensatory strategies. Pt's deficits are severely exacerbated since prior [facility] 01/2022. His intake has been poor throughout this admission and he is at risk of aspiration given any texture due to diffuse oropharyngeal residue. Discussed extensively with pt's son and MD. Recommend engaging PMT for ongoing conversations regarding GOC. Pending these decisions, recommend continuing current diet of Dys 1 texture solids with honey thick liquids when pt is fully awake, alert, and accepting. Do not anticipate pt achieving adequate oral intake. Will continue to follow. Factors that may increase risk of adverse event in presence of aspiration Rubye Oaks & Clearance Coots 2021):  Factors that may increase risk of adverse event in presence of aspiration Rubye Oaks & Clearance Coots 2021): Poor general health and/or compromised immunity; Reduced cognitive function; Limited mobility; Frail or deconditioned; Dependence for feeding and/or oral hygiene; Inadequate oral hygiene; Weak cough; Aspiration of thick, dense, and/or acidic materials Recommendations/Plan: Swallowing Evaluation Recommendations Swallowing Evaluation Recommendations Recommendations: PO diet PO Diet Recommendation: Dysphagia 1 (Pureed); Moderately thick liquids (Level 3, honey thick) Liquid Administration via: Cup; Straw; Spoon Medication Administration: Crushed with puree Supervision: Full assist for feeding; Full supervision/cueing for swallowing strategies Swallowing strategies  : Minimize environmental distractions; Slow rate; Small bites/sips; Check for pocketing or oral holding Postural changes: Position pt fully upright for meals; Stay upright 30-60 min after meals Oral care recommendations: Oral care QID (4x/day); Staff/trained caregiver to provide oral care; Use suctioning for oral care Recommended consults: Consider Palliative care Caregiver Recommendations: Avoid jello, ice cream, thin soups, popsicles; Remove water pitcher; Have oral suction available Treatment Plan Treatment Plan Treatment recommendations: Therapy as outlined in treatment plan below Follow-up recommendations: Skilled nursing-short term rehab (<3 hours/day) Functional status assessment: Patient has had a recent decline in their functional status and/or demonstrates limited ability to make significant improvements in function in a reasonable and predictable amount of time. Treatment frequency: Min 2x/week Treatment duration: 1 week Interventions: Aspiration precaution training; Compensatory techniques; Patient/family education; Trials of upgraded texture/liquids; Diet toleration management by SLP Recommendations Recommendations for follow up therapy are one  component of a multi-disciplinary discharge planning process, led by the attending physician.  Recommendations may be updated based on patient status, additional functional criteria and insurance authorization. Assessment: Orofacial Exam: Orofacial Exam Oral Cavity: Oral Hygiene: Dried secretions; Lingual coating Oral Cavity - Dentition: Edentulous Orofacial Anatomy: WFL Oral Motor/Sensory Function: WFL Anatomy: Anatomy: Suspected cervical osteophytes Boluses Administered: Boluses Administered Boluses Administered: Thin liquids (Level 0); Mildly thick liquids (Level 2, nectar thick); Moderately thick liquids (Level 3, honey thick); Puree  Oral Impairment Domain: Oral Impairment Domain Lip Closure: Interlabial escape, no progression to anterior lip Tongue control during bolus hold: Not tested Bolus preparation/mastication: Slow prolonged chewing/mashing with complete recollection Bolus transport/lingual motion: Delayed initiation of  tongue motion (oral holding) Oral residue: Residue collection on oral structures Location of oral residue : Floor of mouth; Tongue; Palate Initiation of pharyngeal swallow : Pyriform sinuses  Pharyngeal Impairment Domain: Pharyngeal Impairment Domain Soft palate elevation: Escape to nasal cavity Laryngeal elevation: Partial superior movement of thyroid cartilage/partial approximation of arytenoids to epiglottic petiole Anterior hyoid excursion: Partial anterior movement Epiglottic movement: No inversion Laryngeal vestibule closure: Incomplete, narrow column air/contrast in laryngeal vestibule Pharyngeal stripping wave : Present - diminished Pharyngeal contraction (A/P view only): N/A Pharyngoesophageal segment opening: Complete distension and complete duration, no obstruction of flow Tongue base retraction: Wide column of contrast or air between tongue base and PPW Pharyngeal residue: Majority of contrast within or on pharyngeal structures Location of pharyngeal residue: Tongue base;  Valleculae; Pharyngeal wall; Diffuse (>3 areas)  Esophageal Impairment Domain: No data recorded Pill: No data recorded Penetration/Aspiration Scale Score: Penetration/Aspiration Scale Score 2.  Material enters airway, remains ABOVE vocal cords then ejected out: Puree 3.  Material enters airway, remains ABOVE vocal cords and not ejected out: Moderately thick liquids (Level 3, honey thick) 7.  Material enters airway, passes BELOW cords and not ejected out despite cough attempt by patient: Mildly thick liquids (Level 2, nectar thick) 8.  Material enters airway, passes BELOW cords without attempt by patient to eject out (silent aspiration) : Thin liquids (Level 0) Compensatory Strategies: Compensatory Strategies Compensatory strategies: No   General Information: Caregiver present: No  Diet Prior to this Study: Dysphagia 1 (pureed); Extremely thick liquids (Level 4, pudding thick)   Temperature : Normal   Respiratory Status: WFL   Supplemental O2: None (Room air)   History of Recent Intubation: No  Behavior/Cognition: Confused; Agitated Self-Feeding Abilities: Needs hand-over-hand assist for feeding Baseline vocal quality/speech: Hypophonia/low volume Volitional Cough: Unable to elicit Volitional Swallow: Unable to elicit Exam Limitations: Fatigue Goal Planning: Prognosis for improved oropharyngeal function: Fair Barriers to Reach Goals: Cognitive deficits; Time post onset; Severity of deficits No data recorded Patient/Family Stated Goal: pt's son is thankful for more information Consulted and agree with results and recommendations: Patient; Family member/caregiver; Advanced practice provider Pain: Pain Assessment Pain Assessment: Faces Faces Pain Scale: 2 Pain Location: generalized with repositioning Pain Descriptors / Indicators: Discomfort; Grimacing Pain Intervention(s): Monitored during session End of Session: Start Time:SLP Start Time (ACUTE ONLY): 1519 Stop Time: SLP Stop Time (ACUTE ONLY): 1605 Time  Calculation:SLP Time Calculation (min) (ACUTE ONLY): 46 min Charges: SLP Evaluations $ SLP Speech Visit: 1 Visit SLP Evaluations $MBS Swallow: 1 Procedure $Swallowing Treatment: 1 Procedure SLP visit diagnosis: SLP Visit Diagnosis: Dysphagia, oropharyngeal phase (R13.12) Past Medical History: Past Medical History: Diagnosis Date  Acute on chronic respiratory failure with hypoxia (HCC)   Cardiac arrest (HCC)   Cardiac arrest (HCC)   Chronic atrial fibrillation (HCC)   COVID-19 virus infection   Hypertension  Past Surgical History: Past Surgical History: Procedure Laterality Date  HIP SURGERY    IR REPLC GASTRO/COLONIC TUBE PERCUT W/FLUORO  03/16/2020  IR REPLC GASTRO/COLONIC TUBE PERCUT W/FLUORO  04/07/2020 Marlou Porch., CF-SLP Speech Language Pathology, Acute Rehabilitation Services Secure Chat preferred [phone] 12/03/2022, 5:08 PM  DG Chest Port 1 View  Result Date: 12/02/2022 CLINICAL DATA:  89 year old male with history of shortness of breath. EXAM: PORTABLE CHEST 1 VIEW COMPARISON:  Chest x-ray 11/27/2022. FINDINGS: Lung volumes are low. Diffuse interstitial prominence and widespread patchy ill-defined opacities scattered throughout the lungs bilaterally with relative sparing of the left upper lobe. Overall, aeration appears similar to slightly  worsened compared to the prior study. No pleural effusions. No pneumothorax. No evidence of pulmonary edema. Heart size is mildly enlarged. The patient is rotated to the upper mediastinal contours are distorted by patient positioning. Prominence of soft tissues in the high left paratracheal region corresponding to asymmetric substernal goiter demonstrated on prior chest CT. On today's exam, resulting in distortion of the mediastinal contours and reduced diagnostic sensitivity and specificity for mediastinal pathology. IMPRESSION: 1. Although some of the findings in the lungs appear similar to prior studies and are likely chronic indicative of underlying  interstitial lung disease, there is worsening aeration overall, particularly in the right lung, suggesting superimposed multilobar bilateral bronchopneumonia. 2. Cardiomegaly. Electronically Signed   By: Trudie Reed M.D.   On: 12/02/2022 06:59   DG Chest Port 1 View  Result Date: 11/28/2022 CLINICAL DATA:  Acute respiratory failure EXAM: PORTABLE CHEST 1 VIEW COMPARISON:  11/26/2022, 11/24/2022, 01/12/2021 FINDINGS: Elevation of the left diaphragm. Small amount of contrast within left upper quadrant colon. Chronic lung disease with bronchiectasis, fibrosis and ground-glass opacity. Possible superimposed atelectasis left base. Enlarged cardiomediastinal silhouette. No pneumothorax IMPRESSION: Chronic lung disease with bronchiectasis, fibrosis and ground-glass opacity. Possible superimposed atelectasis or minimal infiltrate at the left base. Electronically Signed   By: Jasmine Pang M.D.   On: 11/28/2022 00:00   DG Basil Dess Tube Plc W/Fl W/Rad  Result Date: 11/27/2022 INDICATION: Covid positive, chronic dysphagia, worsening ability to eat and drink in the setting of acute illness. EXAM: NASO G TUBE PLACEMENT WITH FL AND WITH RAD FLUOROSCOPY TIME:  Radiation Exposure Index (as provided by the fluoroscopic device): 16.6 mGy Kerma COMPLICATIONS: None immediate PROCEDURE: A Dobhoff tube was lubricated with viscous lidocaine inserted into the right nostril. Under intermittent fluoroscopic guidance, the Dobhoff tube was advanced with difficulty to the stomach. Despite multiple attempts, the Dobbhoff tube could not be passed through the remainder of the stomach or pylorus due to coiling of the tube in the upper esophagus. Omnipaque administered via tube confirmed placement with the distal tip in the proximal stomach. The tube was affixed to the patient's nose with a bridle by RD. The patient tolerated the procedure well without immediate postprocedural complication. IMPRESSION: Fluoroscopic guided placement of  Dobhoff tube with distal tip in the proximal stomach. The tube is ready for use. This exam was performed by [name] PA-C, and was supervised and interpreted by Myles Rosenthal, MD. Electronically Signed   By: Danae Orleans M.D.   On: 11/27/2022 15:49   DG CHEST PORT 1 VIEW  Result Date: 11/26/2022 CLINICAL DATA:  Respiratory failure with hypoxia EXAM: PORTABLE CHEST 1 VIEW COMPARISON:  Chest x-ray 11/24/2022.  CT chest 11/24/2022 FINDINGS: There is widening of the mediastinum corresponding to enlarged aorta and enlarged left thyroid, unchanged from prior CT. The heart is enlarged similar to prior exam. There central pulmonary vascular congestion. There are minimal patchy and strandy opacities in the right mid and lower lung and left lower lung similar to prior. No pleural effusion or pneumothorax identified. No acute bony abnormality. IMPRESSION: 1. Cardiomegaly with central pulmonary vascular congestion. 2. Minimal patchy and strandy opacities in the right mid and lower lung and left lower lung similar to prior. Electronically Signed   By: Darliss Cheney M.D.   On: 11/26/2022 17:33   DG Abd 1 View  Result Date: 11/24/2022 CLINICAL DATA:  Abdominal pain EXAM: ABDOMEN - 1 VIEW COMPARISON:  05/24/2020 FINDINGS: Stable elevation of the left hemidiaphragm. Previously noted gastrostomy catheter  has been removed. The abdominal gas pattern is indeterminate due to a paucity of intra-abdominal gas. No free intraperitoneal gas. There is a retained contrast noted within the left renal collecting system. No nephro or urolithiasis. Degenerative changes seen within the lumbar spine. Advanced degenerative changes noted within the left hip. The IMPRESSION: 1. Indeterminate abdominal gas pattern due to a paucity of intra-abdominal gas. No free air. Electronically Signed   By: Helyn Numbers M.D.   On: 11/24/2022 23:14   CT Angio Chest PE W and/or Wo Contrast  Result Date: 11/24/2022 CLINICAL DATA:  Altered mental  status.  PE suspected. EXAM: CT ANGIOGRAPHY CHEST WITH CONTRAST TECHNIQUE: Multidetector CT imaging of the chest was performed using the standard protocol during bolus administration of intravenous contrast. Multiplanar CT image reconstructions and MIPs were obtained to evaluate the vascular anatomy. RADIATION DOSE REDUCTION: This exam was performed according to the departmental dose-optimization program which includes automated exposure control, adjustment of the mA and/or kV according to patient size and/or use of iterative reconstruction technique. CONTRAST:  60mL OMNIPAQUE IOHEXOL 350 MG/ML SOLN COMPARISON:  Same day chest radiograph FINDINGS: Cardiovascular: Cardiomegaly. No pericardial effusion. The right and left main pulmonary arteries are dilated measuring 3.5 cm on the right 3.3 cm on the left. There is mixing artifact in the right and left pulmonary arteries. No acute pulmonary embolism. Coronary artery and aortic atherosclerotic calcification. Mediastinum/Nodes: Heterogenous enlarged left thyroid lobe extending into the superior mediastinum and displacing the trachea to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

## 2022-12-04 NOTE — TOC Transition Note (Signed)
Transition of Care [facility]) - CM/SW Discharge Note   Patient Details  Name: Robert Carpenter MRN: [id number] Date of Birth: 03/26/1933  [facility] CM/SW Contact:  [name], LCSW Phone Number: 12/04/2022, 4:00 PM   Clinical Narrative:    Patient will DC to: [facility] Anticipated DC date: 12/04/22 Family notified: Son, [name]  Transport [id number]   Per MD patient ready for DC to [facility]. RN to call report prior to discharge [phone]). RN, patient, patient's family, and facility notified of DC. Discharge Summary and FL2 sent to facility. DC packet on chart. Ambulance transport requested for patient.   CSW will sign off for now as social work intervention is no longer needed. Please consult [place] again if new needs arise.     Final next level of care: Skilled Nursing Facility Barriers to Discharge: Barriers Resolved   Patient Goals and CMS Choice      Discharge Placement     Existing PASRR number confirmed : 12/03/22          Patient chooses bed at: [facility] Patient to be transferred to facility by: [facility] Name of family member notified: Spouse Patient and family notified of of transfer: 12/04/22  Discharge Plan and Services Additional resources added to the After Visit Summary for   In-house Referral: Clinical Social Work   Post Acute Care Choice: Skilled Nursing Facility                               Social Determinants of Health (SDOH) Interventions SDOH Screenings   Food Insecurity: Patient Unable To Answer (11/27/2022)  Housing: Patient Unable To Answer (11/27/2022)  Transportation Needs: Patient Unable To Answer (11/27/2022)  Utilities: Patient Unable To Answer (11/27/2022)  Financial Resource Strain: Low Risk  (12/23/2019)   Received from [facility], [facility]  Tobacco Use: Medium Risk (11/24/2022)     Readmission Risk Interventions     No data to display

## 2022-12-04 NOTE — Progress Notes (Signed)
Patient is alert and restless, cBG checked was 104, vital signs stable. Patient was reposition and made comfortable. [facility] arrived into the unit to transport patient, Patient left the unit at exactly 1956 in good condition, alert and calm.

## 2022-12-05 ENCOUNTER — Encounter (HOSPITAL_COMMUNITY): Payer: Self-pay

## 2022-12-05 ENCOUNTER — Inpatient Hospital Stay (HOSPITAL_COMMUNITY)
Admission: EM | Admit: 2022-12-05 | Discharge: 2023-01-02 | DRG: 640 | Disposition: E | Payer: Medicare Other | Attending: Internal Medicine | Admitting: Internal Medicine

## 2022-12-05 ENCOUNTER — Other Ambulatory Visit: Payer: Self-pay

## 2022-12-05 ENCOUNTER — Emergency Department (HOSPITAL_COMMUNITY): Payer: Medicare Other

## 2022-12-05 ENCOUNTER — Inpatient Hospital Stay (HOSPITAL_COMMUNITY): Payer: Medicare Other

## 2022-12-05 DIAGNOSIS — Z7951 Long term (current) use of inhaled steroids: Secondary | ICD-10-CM

## 2022-12-05 DIAGNOSIS — Z8616 Personal history of COVID-19: Secondary | ICD-10-CM | POA: Diagnosis not present

## 2022-12-05 DIAGNOSIS — Z9189 Other specified personal risk factors, not elsewhere classified: Secondary | ICD-10-CM

## 2022-12-05 DIAGNOSIS — G9341 Metabolic encephalopathy: Secondary | ICD-10-CM | POA: Diagnosis present

## 2022-12-05 DIAGNOSIS — J690 Pneumonitis due to inhalation of food and vomit: Secondary | ICD-10-CM | POA: Diagnosis not present

## 2022-12-05 DIAGNOSIS — Z7952 Long term (current) use of systemic steroids: Secondary | ICD-10-CM

## 2022-12-05 DIAGNOSIS — I10 Essential (primary) hypertension: Secondary | ICD-10-CM | POA: Diagnosis not present

## 2022-12-05 DIAGNOSIS — I5042 Chronic combined systolic (congestive) and diastolic (congestive) heart failure: Secondary | ICD-10-CM | POA: Diagnosis present

## 2022-12-05 DIAGNOSIS — R339 Retention of urine, unspecified: Secondary | ICD-10-CM | POA: Diagnosis present

## 2022-12-05 DIAGNOSIS — E860 Dehydration: Secondary | ICD-10-CM | POA: Diagnosis present

## 2022-12-05 DIAGNOSIS — R1312 Dysphagia, oropharyngeal phase: Secondary | ICD-10-CM | POA: Diagnosis present

## 2022-12-05 DIAGNOSIS — R64 Cachexia: Secondary | ICD-10-CM | POA: Diagnosis present

## 2022-12-05 DIAGNOSIS — E43 Unspecified severe protein-calorie malnutrition: Secondary | ICD-10-CM | POA: Diagnosis present

## 2022-12-05 DIAGNOSIS — Z681 Body mass index (BMI) 19 or less, adult: Secondary | ICD-10-CM | POA: Diagnosis not present

## 2022-12-05 DIAGNOSIS — M6250 Muscle wasting and atrophy, not elsewhere classified, unspecified site: Secondary | ICD-10-CM | POA: Diagnosis present

## 2022-12-05 DIAGNOSIS — J8410 Pulmonary fibrosis, unspecified: Secondary | ICD-10-CM | POA: Diagnosis present

## 2022-12-05 DIAGNOSIS — J9601 Acute respiratory failure with hypoxia: Secondary | ICD-10-CM | POA: Diagnosis present

## 2022-12-05 DIAGNOSIS — E875 Hyperkalemia: Secondary | ICD-10-CM | POA: Diagnosis present

## 2022-12-05 DIAGNOSIS — I4892 Unspecified atrial flutter: Secondary | ICD-10-CM | POA: Diagnosis present

## 2022-12-05 DIAGNOSIS — E873 Alkalosis: Secondary | ICD-10-CM | POA: Diagnosis present

## 2022-12-05 DIAGNOSIS — I110 Hypertensive heart disease with heart failure: Secondary | ICD-10-CM | POA: Diagnosis present

## 2022-12-05 DIAGNOSIS — R638 Other symptoms and signs concerning food and fluid intake: Secondary | ICD-10-CM | POA: Diagnosis not present

## 2022-12-05 DIAGNOSIS — Z7901 Long term (current) use of anticoagulants: Secondary | ICD-10-CM

## 2022-12-05 DIAGNOSIS — R531 Weakness: Secondary | ICD-10-CM | POA: Diagnosis not present

## 2022-12-05 DIAGNOSIS — I482 Chronic atrial fibrillation, unspecified: Secondary | ICD-10-CM | POA: Diagnosis present

## 2022-12-05 DIAGNOSIS — R000 Tachycardia, unspecified: Secondary | ICD-10-CM | POA: Diagnosis not present

## 2022-12-05 DIAGNOSIS — I447 Left bundle-branch block, unspecified: Secondary | ICD-10-CM | POA: Diagnosis present

## 2022-12-05 DIAGNOSIS — Z87891 Personal history of nicotine dependence: Secondary | ICD-10-CM

## 2022-12-05 DIAGNOSIS — Z789 Other specified health status: Secondary | ICD-10-CM | POA: Diagnosis not present

## 2022-12-05 DIAGNOSIS — I4891 Unspecified atrial fibrillation: Secondary | ICD-10-CM | POA: Diagnosis not present

## 2022-12-05 DIAGNOSIS — F0390 Unspecified dementia without behavioral disturbance: Secondary | ICD-10-CM | POA: Diagnosis present

## 2022-12-05 DIAGNOSIS — R4182 Altered mental status, unspecified: Secondary | ICD-10-CM | POA: Diagnosis not present

## 2022-12-05 DIAGNOSIS — Z8674 Personal history of sudden cardiac arrest: Secondary | ICD-10-CM

## 2022-12-05 DIAGNOSIS — J849 Interstitial pulmonary disease, unspecified: Secondary | ICD-10-CM | POA: Diagnosis present

## 2022-12-05 DIAGNOSIS — Z86718 Personal history of other venous thrombosis and embolism: Secondary | ICD-10-CM

## 2022-12-05 DIAGNOSIS — R627 Adult failure to thrive: Secondary | ICD-10-CM | POA: Diagnosis present

## 2022-12-05 DIAGNOSIS — Z711 Person with feared health complaint in whom no diagnosis is made: Secondary | ICD-10-CM | POA: Diagnosis not present

## 2022-12-05 DIAGNOSIS — N179 Acute kidney failure, unspecified: Secondary | ICD-10-CM | POA: Diagnosis present

## 2022-12-05 DIAGNOSIS — Z7189 Other specified counseling: Secondary | ICD-10-CM | POA: Diagnosis not present

## 2022-12-05 DIAGNOSIS — I5040 Unspecified combined systolic (congestive) and diastolic (congestive) heart failure: Secondary | ICD-10-CM | POA: Diagnosis not present

## 2022-12-05 DIAGNOSIS — Z66 Do not resuscitate: Secondary | ICD-10-CM | POA: Diagnosis not present

## 2022-12-05 DIAGNOSIS — Z515 Encounter for palliative care: Secondary | ICD-10-CM

## 2022-12-05 DIAGNOSIS — D6959 Other secondary thrombocytopenia: Secondary | ICD-10-CM | POA: Diagnosis present

## 2022-12-05 DIAGNOSIS — Z8701 Personal history of pneumonia (recurrent): Secondary | ICD-10-CM

## 2022-12-05 DIAGNOSIS — R410 Disorientation, unspecified: Secondary | ICD-10-CM

## 2022-12-05 DIAGNOSIS — Z79899 Other long term (current) drug therapy: Secondary | ICD-10-CM

## 2022-12-05 LAB — URINALYSIS, W/ REFLEX TO CULTURE (INFECTION SUSPECTED)
Bilirubin Urine: NEGATIVE
Glucose, UA: NEGATIVE mg/dL
Ketones, ur: NEGATIVE mg/dL
Nitrite: NEGATIVE
Protein, ur: 30 mg/dL — AB
Specific Gravity, Urine: 1.018 (ref 1.005–1.030)
pH: 5 (ref 5.0–8.0)

## 2022-12-05 LAB — TSH: TSH: 3.046 u[IU]/mL (ref 0.350–4.500)

## 2022-12-05 LAB — COMPREHENSIVE METABOLIC PANEL
ALT: 30 U/L (ref 0–44)
AST: 40 U/L (ref 15–41)
Albumin: 2.8 g/dL — ABNORMAL LOW (ref 3.5–5.0)
Alkaline Phosphatase: 47 U/L (ref 38–126)
Anion gap: 17 — ABNORMAL HIGH (ref 5–15)
BUN: 32 mg/dL — ABNORMAL HIGH (ref 8–23)
CO2: 23 mmol/L (ref 22–32)
Calcium: 8.7 mg/dL — ABNORMAL LOW (ref 8.9–10.3)
Chloride: 98 mmol/L (ref 98–111)
Creatinine, Ser: 1.18 mg/dL (ref 0.61–1.24)
GFR, Estimated: 60 mL/min — ABNORMAL LOW (ref >60)
Glucose, Bld: 91 mg/dL (ref 70–99)
Potassium: 5.6 mmol/L — ABNORMAL HIGH (ref 3.5–5.1)
Sodium: 138 mmol/L (ref 135–145)
Total Bilirubin: 1.9 mg/dL — ABNORMAL HIGH (ref <1.2)
Total Protein: 5.7 g/dL — ABNORMAL LOW (ref 6.5–8.1)

## 2022-12-05 LAB — C-REACTIVE PROTEIN: CRP: 2.8 mg/dL — ABNORMAL HIGH (ref <1.0)

## 2022-12-05 LAB — AMMONIA: Ammonia: 24 umol/L (ref 9–35)

## 2022-12-05 LAB — CBC
HCT: 49.4 % (ref 39.0–52.0)
Hemoglobin: 15.7 g/dL (ref 13.0–17.0)
MCH: 31.5 pg (ref 26.0–34.0)
MCHC: 31.8 g/dL (ref 30.0–36.0)
MCV: 99.2 fL (ref 80.0–100.0)
Platelets: 159 10*3/uL (ref 150–400)
RBC: 4.98 MIL/uL (ref 4.22–5.81)
RDW: 14.9 % (ref 11.5–15.5)
WBC: 10.3 10*3/uL (ref 4.0–10.5)
nRBC: 0 % (ref 0.0–0.2)

## 2022-12-05 LAB — GLUCOSE, CAPILLARY: Glucose-Capillary: 177 mg/dL — ABNORMAL HIGH (ref 70–99)

## 2022-12-05 LAB — POTASSIUM: Potassium: 4 mmol/L (ref 3.5–5.1)

## 2022-12-05 MED ORDER — MEGESTROL ACETATE 40 MG PO TABS
40.0000 mg | ORAL_TABLET | Freq: Every day | ORAL | Status: DC
  Filled 2022-12-05 (×4): qty 1

## 2022-12-05 MED ORDER — LORAZEPAM 2 MG/ML IJ SOLN
0.5000 mg | Freq: Once | INTRAMUSCULAR | Status: AC
  Administered 2022-12-05: 0.5 mg via INTRAVENOUS
  Filled 2022-12-05: qty 1

## 2022-12-05 MED ORDER — ACETAMINOPHEN 325 MG PO TABS: 650.0000 mg | ORAL_TABLET | Freq: Four times a day (QID) | ORAL | Status: DC | PRN

## 2022-12-05 MED ORDER — ONDANSETRON HCL 4 MG/2ML IJ SOLN: 4.0000 mg | Freq: Four times a day (QID) | INTRAMUSCULAR | Status: DC | PRN

## 2022-12-05 MED ORDER — NYSTATIN 100000 UNIT/ML MT SUSP
5.0000 mL | Freq: Four times a day (QID) | OROMUCOSAL | Status: DC
  Administered 2022-12-05 – 2022-12-08 (×8): 500000 [IU] via ORAL
  Filled 2022-12-05 (×8): qty 5

## 2022-12-05 MED ORDER — HYDRALAZINE HCL 20 MG/ML IJ SOLN
10.0000 mg | Freq: Four times a day (QID) | INTRAMUSCULAR | Status: DC | PRN
  Administered 2022-12-06 – 2022-12-07 (×2): 10 mg via INTRAVENOUS
  Filled 2022-12-05 (×2): qty 1

## 2022-12-05 MED ORDER — METOPROLOL TARTRATE 25 MG PO TABS
25.0000 mg | ORAL_TABLET | Freq: Two times a day (BID) | ORAL | Status: DC
  Filled 2022-12-05: qty 1

## 2022-12-05 MED ORDER — FUROSEMIDE 10 MG/ML IJ SOLN: 20.0000 mg | Freq: Once | INTRAMUSCULAR | Status: DC

## 2022-12-05 MED ORDER — SODIUM ZIRCONIUM CYCLOSILICATE 10 G PO PACK: 10.0000 g | PACK | Freq: Two times a day (BID) | ORAL | Status: DC

## 2022-12-05 MED ORDER — MIRTAZAPINE 15 MG PO TABS: 7.5000 mg | ORAL_TABLET | Freq: Every day | ORAL | Status: DC

## 2022-12-05 MED ORDER — PANTOPRAZOLE SODIUM 40 MG IV SOLR
40.0000 mg | INTRAVENOUS | Status: DC
  Administered 2022-12-05 – 2022-12-08 (×4): 40 mg via INTRAVENOUS
  Filled 2022-12-05 (×4): qty 10

## 2022-12-05 MED ORDER — HALOPERIDOL LACTATE 5 MG/ML IJ SOLN
2.0000 mg | Freq: Four times a day (QID) | INTRAMUSCULAR | Status: DC | PRN
  Administered 2022-12-05: 2 mg via INTRAMUSCULAR
  Filled 2022-12-05: qty 1

## 2022-12-05 MED ORDER — DEXTROSE 5 % IV SOLN: INTRAVENOUS | Status: DC

## 2022-12-05 MED ORDER — AMANTADINE HCL 100 MG PO CAPS
100.0000 mg | ORAL_CAPSULE | Freq: Every day | ORAL | Status: DC
  Filled 2022-12-05 (×3): qty 1

## 2022-12-05 MED ORDER — SENNOSIDES-DOCUSATE SODIUM 8.6-50 MG PO TABS: 2.0000 | ORAL_TABLET | Freq: Every day | ORAL | Status: DC | PRN

## 2022-12-05 MED ORDER — LORAZEPAM 2 MG/ML IJ SOLN: 0.5000 mg | Freq: Four times a day (QID) | INTRAMUSCULAR | Status: DC | PRN

## 2022-12-05 MED ORDER — DILTIAZEM HCL 25 MG/5ML IV SOLN
10.0000 mg | Freq: Four times a day (QID) | INTRAVENOUS | Status: DC | PRN
  Administered 2022-12-07 – 2022-12-08 (×3): 10 mg via INTRAVENOUS
  Filled 2022-12-05 (×3): qty 5

## 2022-12-05 MED ORDER — TAMSULOSIN HCL 0.4 MG PO CAPS
0.4000 mg | ORAL_CAPSULE | Freq: Every day | ORAL | Status: DC
  Filled 2022-12-05: qty 1

## 2022-12-05 MED ORDER — APIXABAN 5 MG PO TABS
5.0000 mg | ORAL_TABLET | Freq: Two times a day (BID) | ORAL | Status: DC
  Filled 2022-12-05: qty 1

## 2022-12-05 MED ORDER — QUETIAPINE FUMARATE 25 MG PO TABS
25.0000 mg | ORAL_TABLET | Freq: Two times a day (BID) | ORAL | Status: DC
  Filled 2022-12-05: qty 1

## 2022-12-05 MED ORDER — METHYLPREDNISOLONE SODIUM SUCC 125 MG IJ SOLR
60.0000 mg | INTRAMUSCULAR | Status: DC
  Administered 2022-12-05: 60 mg via INTRAVENOUS
  Filled 2022-12-05: qty 2

## 2022-12-05 MED ORDER — LACTATED RINGERS IV BOLUS
1000.0000 mL | Freq: Once | INTRAVENOUS | Status: AC
  Administered 2022-12-05: 1000 mL via INTRAVENOUS

## 2022-12-05 MED ORDER — ONDANSETRON HCL 4 MG PO TABS: 4.0000 mg | ORAL_TABLET | Freq: Four times a day (QID) | ORAL | Status: DC | PRN

## 2022-12-05 MED ORDER — IPRATROPIUM-ALBUTEROL 0.5-2.5 (3) MG/3ML IN SOLN: 3.0000 mL | Freq: Four times a day (QID) | RESPIRATORY_TRACT | Status: DC | PRN

## 2022-12-05 MED ORDER — FLUTICASONE FUROATE-VILANTEROL 200-25 MCG/ACT IN AEPB
1.0000 | INHALATION_SPRAY | Freq: Every day | RESPIRATORY_TRACT | Status: DC
  Filled 2022-12-05 (×2): qty 28

## 2022-12-05 NOTE — ED Notes (Signed)
Pt with thick whitish and yellowish coating to tongue and roof of mouth. Oral care suction kit requested from ICU. Pt mouth swabbed and cleaned using oral care kit. Most of coating removed and appears to be denture adhesive. Some coating remains on tongue due to unable to scrape off. No sores or blistering. Some redness noted to roof of mouth likely due to irritation. Pt unable to follow commands so ordered nystatin suspension applied with mouth swab. Pt also turned to assess rectal temperature. No breakdown noted to sacral area. Pt now resting comfortably. Respirations even and unlabored. Equal rise and fall of chest noted. Skin warm and dry. Care ongoing.

## 2022-12-05 NOTE — H&P (Addendum)
TRH H&P   Patient Demographics:    Robert Carpenter, is a 89 y.o. male  MRN: [id number]   DOB - 12-Nov-1933  Admit Date - 12/05/2022  Outpatient Primary MD for the patient is Shayne Alken, MD    Patient coming from: SNF  Chief Complaint  Patient presents with   Weakness      HPI:    Robert Carpenter  is a 89 y.o. male, with known history of ILD, dysphagia, aspiration pneumonia, severe COVID-19 pneumonia infection in the past with reoccurrence few weeks ago requiring hospital admission was recently discharged on 12/04/2022, suspected undiagnosed early dementia, poor oral intake, failure to thrive, chronic A-fib on Eliquis, history of DVT, prior history of cardiac arrest, prior history of intubation requiring trach and PEG due to severe COVID-19 few years ago, chronic prednisone use.  Patient with above history was admitted to this hospital on 11/24/2022 from SNF with decreased mental status, decreased p.o. intake, failure to thrive, dehydration, he does have the symptoms at baseline but symptoms got considerably worse, he was diagnosed with another bout of COVID-19 infection with acute metabolic encephalopathy, poor oral intake severe dehydration and admitted to the hospital.  With supportive care he showed some improvement however his oral intake was very poor and he frequently required IV D50 to augment his CBGs.  We had long discussion with patient's son prior to his discharge on 12/04/2022 that his father's oral intake is very poor and that it looks like he is nearing his and, we encouraged him to think about comfort measures, he said he is quite religious and he will discuss this with his sister.  Patient  subsequently went to SNF however he did not eat or drink at all since he was at [facility] and he was sent to the hospital for dehydration and generalized weakness.  He is currently awake however he has encephalopathy and will not answer questions, appears to be in no distress.  Lab work consistent with dehydration, mild hyperkalemia, I was requested to admit him for poor oral intake, failure to thrive, metabolic encephalopathy, dehydration and hyperkalemia.    Review of systems:    A full 10 point Review of Systems was done, except as stated above, all other Review of Systems were negative.   With Past History of the following :  Past Medical History:  Diagnosis Date   Acute on chronic respiratory failure with hypoxia (HCC)    Cardiac arrest (HCC)    Cardiac arrest (HCC)    Chronic atrial fibrillation (HCC)    COVID-19 virus infection    Hypertension       Past Surgical History:  Procedure Laterality Date   HIP SURGERY     IR REPLC GASTRO/COLONIC TUBE PERCUT W/FLUORO  03/16/2020   IR REPLC GASTRO/COLONIC TUBE PERCUT W/FLUORO  04/07/2020      Social History:     Social History   Tobacco Use   Smoking status: Former    Current packs/day: 0.00    Types: Cigarettes    Quit date: 1970    Years since quitting: 54.9   Smokeless tobacco: Never  Substance Use Topics   Alcohol use: Not Currently         Family History :   Patient unable to provide family history   Home Medications:   Prior to Admission medications   Medication Sig Start Date End Date Taking? Authorizing Provider  acetaminophen (TYLENOL) 325 MG tablet Take 650 mg by mouth every 6 (six) hours as needed for headache, fever or mild pain (pain score 1-3).   Yes [provider]  amantadine (SYMMETREL) 100 MG capsule Take 100 mg by mouth daily.   Yes [provider]  apixaban (ELIQUIS) 5 MG TABS tablet Take 1 tablet (5 mg total) by mouth 2 (two) times daily. 12/04/22  Yes Leroy Sea, MD  BREO  ELLIPTA 200-25 MCG/ACT AEPB Inhale 1 puff into the lungs daily. 11/06/22  Yes [provider]  ipratropium-albuterol (DUONEB) 0.5-2.5 (3) MG/3ML SOLN Take 3 mLs by nebulization in the morning, at noon, and at bedtime. 11/06/22  Yes [provider]  metoprolol tartrate (LOPRESSOR) 100 MG tablet Take 1 tablet (100 mg total) by mouth 2 (two) times daily. 12/04/22  Yes Leroy Sea, MD  mirtazapine (REMERON) 7.5 MG tablet Take 7.5 mg by mouth at bedtime.   Yes [provider]  Multiple Vitamin (MULTIVITAMIN WITH MINERALS) TABS tablet Take 1 tablet by mouth daily.   Yes [provider]  predniSONE (DELTASONE) 10 MG tablet Take 10 mg by mouth daily. 11/06/22  Yes [provider]  QUEtiapine (SEROQUEL) 25 MG tablet Take 1 tablet (25 mg total) by mouth 2 (two) times daily. 12/04/22  Yes Leroy Sea, MD  sennosides-docusate sodium (SENOKOT-S) 8.6-50 MG tablet Take 2 tablets by mouth in the morning and at bedtime.   Yes [provider]  sertraline (ZOLOFT) 50 MG tablet Take 50 mg by mouth daily.   Yes [provider]  tamsulosin (FLOMAX) 0.4 MG CAPS capsule Take 1 capsule (0.4 mg total) by mouth daily. 12/04/22  Yes Leroy Sea, MD  traZODone (DESYREL) 50 MG tablet Take 50 mg by mouth at bedtime. 11/07/22  Yes [provider]  VENTOLIN HFA 108 (90 Base) MCG/ACT inhaler Inhale 1 puff into the lungs every 6 (six) hours as needed for wheezing or shortness of breath. 08/24/22  Yes [provider]  pantoprazole (PROTONIX) 40 MG tablet Take 1 tablet (40 mg total) by mouth daily. Patient not taking: Reported on 12/05/2022 12/04/22   Leroy Sea, MD     Allergies:    No Known Allergies   Physical Exam:   Vitals  Blood pressure 137/82, pulse 88, temperature (!) 95.7 F (35.4 C), temperature source Rectal, resp. rate 14, SpO2 97%.   1.  General Frail elderly African-American male lying in hospital bed in no distress,  he is confused, unable to follow commands or answer questions  2.  Psych eval unable to obtain,   3. No F.N deficits, hold all 4 extremities to painful stimuli, Plantars down going.  4. Ears and Eyes appear Normal, Conjunctivae clear, PERRLA.  Dry oral Mucosa.  5. Supple Neck, No JVD, No cervical lymphadenopathy appriciated, No Carotid Bruits.  6. Symmetrical Chest wall movement, Good air movement bilaterally, CTAB.  7. RRR, No Gallops, Rubs or Murmurs, No Parasternal Heave.  8. Positive Bowel Sounds, Abdomen Soft, No tenderness, No organomegaly appriciated,No rebound -guarding or rigidity.  9.  No Cyanosis, Normal Skin Turgor, No Skin Rash or Bruise.  10. Good muscle tone,  joints appear normal , no effusions, Normal ROM.  11. No Palpable Lymph Nodes in Neck or Axillae      Data Review:   Recent Labs  Lab 11/29/22 0703 11/30/22 0450 12/02/22 0620 12/03/22 0517 12/05/22 1215  WBC 12.1* 9.3 7.6 5.9 10.3  HGB 14.9 14.5 14.8 14.2 15.7  HCT 44.8 44.0 44.2 42.6 49.4  PLT 98* 131* 142* 147* 159  MCV 95.3 93.6 95.3 93.4 99.2  MCH 31.7 30.9 31.9 31.1 31.5  MCHC 33.3 33.0 33.5 33.3 31.8  RDW 15.4 15.1 15.3 15.0 14.9  LYMPHSABS  --   --  0.8 0.8  --   MONOABS  --   --  0.4 0.4  --   EOSABS  --   --  0.0 0.0  --   BASOSABS  --   --  0.0 0.0  --     Recent Labs  Lab 11/28/22 1807 11/30/22 0450 12/02/22 0620 12/02/22 0956 12/03/22 0517 12/05/22 1215  NA  --  140 142  --  141 138  K  --  4.6 4.5  --  4.2 5.6*  CL  --  100 103  --  99 98  CO2  --  31 26  --  31 23  ANIONGAP  --  9 13  --  11 17*  GLUCOSE  --  109* 125*  --  150* 91  BUN  --  20 31*  --  30* 32*  CREATININE  --  1.06 1.04  --  1.20 1.18  AST  --   --   --   --   --  40  ALT  --   --   --   --   --  30  ALKPHOS  --   --   --   --   --  47  BILITOT  --   --   --   --   --  1.9*  ALBUMIN  --   --   --   --   --  2.8*  CRP  --   --  6.7*  --  4.6*  --   PROCALCITON  --   --   --  0.10 0.10  --   BNP   --   --  939.5*  --  1,115.5*  --   MG 1.9 1.9 1.9  --  2.1  --   CALCIUM  --  8.7* 8.8*  --  8.7* 8.7*    No results found for: "CHOL", "HDL", "LDLCALC", "LDLDIRECT", "TRIG", "CHOLHDL"  Recent Labs  Lab 11/28/22 1807 11/30/22 0450 12/02/22 0620 12/02/22 0956 12/03/22 0517 12/05/22 1215  CRP  --   --  6.7*  --  4.6*  --   PROCALCITON  --   --   --  0.10 0.10  --   BNP  --   --  939.5*  --  1,115.5*  --   MG 1.9 1.9 1.9  --  2.1  --   CALCIUM  --  8.7* 8.8*  --  8.7* 8.7*    Recent Labs  Lab 11/29/22 0703 11/30/22 0450 12/02/22 0620 12/02/22 0956 12/03/22 0517 12/05/22 1215  WBC 12.1* 9.3 7.6  --  5.9 10.3  PLT 98* 131* 142*  --  147* 159  CRP  --   --  6.7*  --  4.6*  --   PROCALCITON  --   --   --  0.10 0.10  --   CREATININE  --  1.06 1.04  --  1.20 1.18    Urinalysis    Component Value Date/Time   COLORURINE YELLOW 11/24/2022 1625   APPEARANCEUR CLEAR 11/24/2022 1625   LABSPEC 1.010 11/24/2022 1625   PHURINE 5.0 11/24/2022 1625   GLUCOSEU NEGATIVE 11/24/2022 1625   HGBUR NEGATIVE 11/24/2022 1625   BILIRUBINUR NEGATIVE 11/24/2022 1625   KETONESUR NEGATIVE 11/24/2022 1625   PROTEINUR NEGATIVE 11/24/2022 1625   NITRITE NEGATIVE 11/24/2022 1625   LEUKOCYTESUR SMALL (A) 11/24/2022 1625      Imaging Results:    DG Chest Port 1 View  Result Date: 12/05/2022 CLINICAL DATA:  Weakness. EXAM: PORTABLE CHEST 1 VIEW COMPARISON:  12/02/2022. FINDINGS: Low lung volume. Redemonstration of diffuse chronic interstitial markings throughout bilateral lungs with relative sparing of the left upper and mid lung zones, essentially similar to the prior study. Findings may represent underlying lung fibrosis/scarring. No acute consolidation or lung collapse seen. There is probable small layering right pleural effusion. No significant left pleural effusion. No pneumothorax on either side. Stable cardio-mediastinal silhouette. No acute osseous abnormalities. The soft tissues are within  normal limits. IMPRESSION: *Essentially stable exam. Redemonstration of diffuse chronic interstitial markings throughout bilateral lungs with relative sparing of the left upper and mid lung zones. *No acute consolidation or lung collapse. Electronically Signed   By: Jules Schick M.D.   On: 12/05/2022 14:00   DG Swallowing Func-Speech Pathology  Result Date: 12/03/2022 Table formatting from the original result was not included. Modified Barium Swallow Study Patient Details Name: Robert Carpenter MRN: [id number] Date of Birth: [birth year] Today's Date: 12/03/2022 HPI/PMH: HPI: 89 year old man who is critically ill due to acute hypoxic and hypercarbic respiratory failure requiring NIV due to COVID pneumonia. He is SNF-bound at baseline, HFrEF and chronic Afib. He has baseline disorientation but is normally communicative. Pt has a history of cardiac arrest, trach and PEG to recover, now removed. Has has several MBS, the last on 01/10/22 that showed mild residue that clears with a second swallow, no penetration or aspiration. He has had recurrent aspiration pna however. Recommended to consume Mech soft/thin liquids due to impairment in mastication. Clinical Impression: Clinical Impression: Pt presents with a significant oropharyngeal dysphagia characterized by impaired strength, timing, and control. Pt's cognition further impacts structural deficits resulting in posterior spillage of boluses prior to swallow initiation. This, in addition to incomplete anterior hyoid burst and subsequent deficient laryngeal vestibule closure, results in silent aspiration (PAS 8) of thin liquids and eventual sensed aspiration of nectar thick liquids (PAS 7). Limited trials of honey thick liquids and purees are penetrated above the vocal folds and only intermittently cleared. Pt has substantial residue that is not effectively cleared by unsuccessful  attempts at subswallows given pt's base of tongue weakness, absent epiglottic inversion,  and decreased pharyngeal stripping wave. Pt's velopharyngeal closure is incomplete, allowing boluses to consistently enter his nasal cavity. He was unable to follow commands to swallow, cough, or perform any additional compensatory strategies. Pt's deficits are severely exacerbated since prior [facility] 01/2022. His intake has been poor throughout this admission and he is at risk of aspiration given any texture due to diffuse oropharyngeal residue. Discussed extensively with pt's son and MD. Recommend engaging PMT for ongoing conversations regarding GOC. Pending these decisions, recommend continuing current diet of Dys 1 texture solids with honey thick liquids when pt is fully awake, alert, and accepting. Do not anticipate pt achieving adequate oral intake. Will continue to follow. Factors that may increase risk of adverse event in presence of aspiration Rubye Oaks & Clearance Coots 2021): Factors that may increase risk of adverse event in presence of aspiration Rubye Oaks & Clearance Coots 2021): Poor general health and/or compromised immunity; Reduced cognitive function; Limited mobility; Frail or deconditioned; Dependence for feeding and/or oral hygiene; Inadequate oral hygiene; Weak cough; Aspiration of thick, dense, and/or acidic materials Recommendations/Plan: Swallowing Evaluation Recommendations Swallowing Evaluation Recommendations Recommendations: PO diet PO Diet Recommendation: Dysphagia 1 (Pureed); Moderately thick liquids (Level 3, honey thick) Liquid Administration via: Cup; Straw; Spoon Medication Administration: Crushed with puree Supervision: Full assist for feeding; Full supervision/cueing for swallowing strategies Swallowing strategies  : Minimize environmental distractions; Slow rate; Small bites/sips; Check for pocketing or oral holding Postural changes: Position pt fully upright for meals; Stay upright 30-60 min after meals Oral care recommendations: Oral care QID (4x/day); Staff/trained caregiver to provide oral care; Use  suctioning for oral care Recommended consults: Consider Palliative care Caregiver Recommendations: Avoid jello, ice cream, thin soups, popsicles; Remove water pitcher; Have oral suction available Treatment Plan Treatment Plan Treatment recommendations: Therapy as outlined in treatment plan below Follow-up recommendations: Skilled nursing-short term rehab (<3 hours/day) Functional status assessment: Patient has had a recent decline in their functional status and/or demonstrates limited ability to make significant improvements in function in a reasonable and predictable amount of time. Treatment frequency: Min 2x/week Treatment duration: 1 week Interventions: Aspiration precaution training; Compensatory techniques; Patient/family education; Trials of upgraded texture/liquids; Diet toleration management by SLP Recommendations Recommendations for follow up therapy are one component of a multi-disciplinary discharge planning process, led by the attending physician.  Recommendations may be updated based on patient status, additional functional criteria and insurance authorization. Assessment: Orofacial Exam: Orofacial Exam Oral Cavity: Oral Hygiene: Dried secretions; Lingual coating Oral Cavity - Dentition: Edentulous Orofacial Anatomy: WFL Oral Motor/Sensory Function: WFL Anatomy: Anatomy: Suspected cervical osteophytes Boluses Administered: Boluses Administered Boluses Administered: Thin liquids (Level 0); Mildly thick liquids (Level 2, nectar thick); Moderately thick liquids (Level 3, honey thick); Puree  Oral Impairment Domain: Oral Impairment Domain Lip Closure: Interlabial escape, no progression to anterior lip Tongue control during bolus hold: Not tested Bolus preparation/mastication: Slow prolonged chewing/mashing with complete recollection Bolus transport/lingual motion: Delayed initiation of tongue motion (oral holding) Oral residue: Residue collection on oral structures Location of oral residue : Floor of  mouth; Tongue; Palate Initiation of pharyngeal swallow : Pyriform sinuses  Pharyngeal Impairment Domain: Pharyngeal Impairment Domain Soft palate elevation: Escape to nasal cavity Laryngeal elevation: Partial superior movement of thyroid cartilage/partial approximation of arytenoids to epiglottic petiole Anterior hyoid excursion: Partial anterior movement Epiglottic movement: No inversion Laryngeal vestibule closure: Incomplete, narrow column air/contrast in laryngeal vestibule Pharyngeal stripping wave : Present - diminished Pharyngeal contraction (A/P view  only): N/A Pharyngoesophageal segment opening: Complete distension and complete duration, no obstruction of flow Tongue base retraction: Wide column of contrast or air between tongue base and PPW Pharyngeal residue: Majority of contrast within or on pharyngeal structures Location of pharyngeal residue: Tongue base; Valleculae; Pharyngeal wall; Diffuse (>3 areas)  Esophageal Impairment Domain: No data recorded Pill: No data recorded Penetration/Aspiration Scale Score: Penetration/Aspiration Scale Score 2.  Material enters airway, remains ABOVE vocal cords then ejected out: Puree 3.  Material enters airway, remains ABOVE vocal cords and not ejected out: Moderately thick liquids (Level 3, honey thick) 7.  Material enters airway, passes BELOW cords and not ejected out despite cough attempt by patient: Mildly thick liquids (Level 2, nectar thick) 8.  Material enters airway, passes BELOW cords without attempt by patient to eject out (silent aspiration) : Thin liquids (Level 0) Compensatory Strategies: Compensatory Strategies Compensatory strategies: No   General Information: Caregiver present: No  Diet Prior to this Study: Dysphagia 1 (pureed); Extremely thick liquids (Level 4, pudding thick)   Temperature : Normal   Respiratory Status: WFL   Supplemental O2: None (Room air)   History of Recent Intubation: No  Behavior/Cognition: Confused; Agitated Self-Feeding  Abilities: Needs hand-over-hand assist for feeding Baseline vocal quality/speech: Hypophonia/low volume Volitional Cough: Unable to elicit Volitional Swallow: Unable to elicit Exam Limitations: Fatigue Goal Planning: Prognosis for improved oropharyngeal function: Fair Barriers to Reach Goals: Cognitive deficits; Time post onset; Severity of deficits No data recorded Patient/Family Stated Goal: pt's son is thankful for more information Consulted and agree with results and recommendations: Patient; Family member/caregiver; Advanced practice provider Pain: Pain Assessment Pain Assessment: Faces Faces Pain Scale: 2 Pain Location: generalized with repositioning Pain Descriptors / Indicators: Discomfort; Grimacing Pain Intervention(s): Monitored during session End of Session: Start Time:SLP Start Time (ACUTE ONLY): 1519 Stop Time: SLP Stop Time (ACUTE ONLY): 1605 Time Calculation:SLP Time Calculation (min) (ACUTE ONLY): 46 min Charges: SLP Evaluations $ SLP Speech Visit: 1 Visit SLP Evaluations $MBS Swallow: 1 Procedure $Swallowing Treatment: 1 Procedure SLP visit diagnosis: SLP Visit Diagnosis: Dysphagia, oropharyngeal phase (R13.12) Past Medical History: Past Medical History: Diagnosis Date  Acute on chronic respiratory failure with hypoxia (HCC)   Cardiac arrest (HCC)   Cardiac arrest (HCC)   Chronic atrial fibrillation (HCC)   COVID-19 virus infection   Hypertension  Past Surgical History: Past Surgical History: Procedure Laterality Date  HIP SURGERY    IR REPLC GASTRO/COLONIC TUBE PERCUT W/FLUORO  03/16/2020  IR REPLC GASTRO/COLONIC TUBE PERCUT W/FLUORO  04/07/2020 Gwynneth Aliment, M.A., CF-SLP Speech Language Pathology, Acute Rehabilitation Services Secure Chat preferred [phone] 12/03/2022, 5:08 PM   My personal review of EKG: Rhythm A-fib with underlying left bundle branch block consistent with previous EKG   Assessment & Plan:   Poor oral intake, failure to thrive, severe protein calorie malnutrition,  cachexia, chronic history of poor oral intake, oropharyngeal dysphagia acute on chronic.  Patient has had gradually progressive dysphagia and failure to thrive along with poor oral intake which has been ongoing for several months, he recently had COVID-19 infection few weeks ago which made the symptoms worse, he was discharged to SNF on 12/04/2022 and son was updated on patient's extremely poor oral intake and the likelihood of him not doing well, he was encouraged to think about comfort measures however he wanted to get some more time to think.  Unfortunately as expected patient did not eat or drink well at SNF and is back within a day, we will consult palliative  care for goals of care, I have called the son again already however he still wants more time to think about future course of action for now he wants everything done and wants his dad to be full code.  Will commence with IV fluids with D5, continue Remeron and add Megace, he is on chronic 10 mg of oral prednisone for his underlying lung issues I will switch it to IV steroids which may augment his appetite, speech therapy will be reinvolved for his underlying dysphagia and he will be kept on soft diet as tolerated with feeding assistance and aspiration precautions.  His long-term prognosis again looks quite poor and looks like he is gradually declining due to his advanced age and most likely underlying early dementia.   Dehydration with hyperkalemia.  Lokelma, IV fluids, gentle Lasix at night to augment renal clearance of potassium as oral intake is questionable, telemetry and monitor.    Acute metabolic encephalopathy superimposed on dementia:  Etiology multifactorial likely due to oral intake on top of his underlying dementia, low-dose Seroquel, minimize benzodiazepines and narcotics, check head CT, TSH, ammonia, UA, bladder scan and monitor.   Chronic combined diastolic and systolic heart failure:  Lasix as needed to be used , currently  appears dehydrated gentle IV fluids    Chronic atrial fibrillation/A-flutter with A-fib: Chronic underlying left bundle branch block.  [place] vas 2 score of greater than 3. Rate controlled continue beta-blocker and Eliquis.  As needed IV Cardizem.   Essential hypertension: As he is dehydrated will drop his home beta-blocker dose, as needed IV Cardizem and hydralazine on board.   Mild thrombocytopenia: Likely due to COVID-19 infection.  Monitor intermittently.   Recent acute urinary retention: Continue Flomax, monitor bladder scans, required Foley last admission.  Monitor.   Oropharyngeal dysphagia: Speech evaluated the patient now on a dysphagia 1 diet.   History of severe COVID-19 infection in 2021 with prolonged vent requirement trach and PEG (decannulated and PEG removed)/now with pulmonary fibrosis:  Noted has been weaned to room air.  Oral steroids switched to IV due to poor oral intake, chest x-ray nonacute currently on room air.  Continue to monitor.       DVT Prophylaxis Eliquis and SCDs  AM Labs Ordered, also please review Full Orders  Family Communication: Admission, patients condition and plan of care including tests being ordered have been discussed with the patient and son who indicate understanding and agree with the plan and Code Status.  Code Status full code  Likely DC to  SNF  Condition GUARDED    Consults called:  Pall Care   Admission status: Inpt    Time spent in minutes : 35  Signature  -    Susa Raring M.D on 12/05/2022 at 4:12 PM   -  To page go to [email or web address]

## 2022-12-05 NOTE — Progress Notes (Signed)
  Patient nurse reporting that patient is very restless.  Haldol is not helping him much to calm down. -Ordered Ativan 0.4 mg one-time dose, continue fall precaution and delirium precaution. - Continue bedside sitter for safety precaution. -Also ordered oral care

## 2022-12-05 NOTE — ED Notes (Signed)
Admitting provider paged.

## 2022-12-05 NOTE — ED Notes (Signed)
ED TO INPATIENT HANDOFF REPORT  ED Nurse Name and Phone #: Juliette Alcide RN [phone]  S Name/Age/Gender Robert Carpenter 89 y.o. male Room/Bed: 039C/039C  Code Status   Code Status: Full Code  Home/SNF/Other Nursing Home Unable to assess orientation. Is this baseline? Yes   Triage Complete: Triage complete  Chief Complaint Dehydration [E86.0] Hyperkalemia [E87.5]  Triage Note Pt bib ems from [facility]; staff called out for agitation, no decreased PO intake; discharged from hospital yesterday , admitted for pneumonia, sepsis; hx demenita; a and o x 1, ems reports this is pt's baseline; 138/90, HR 80s, cbg 153, 99% on 12L NRB at facility; currently on RA, no distress at present   Allergies No Known Allergies  Level of Care/Admitting Diagnosis ED Disposition     ED Disposition  Admit   Condition  --   Comment  Hospital Area: [facility] [[id number]]  Level of Care: Telemetry Medical [[id number]]  May admit patient to [facility] or [facility] if equivalent level of care is available:: No  Covid Evaluation: Asymptomatic - no recent exposure (last 10 days) testing not required  Diagnosis: Hyperkalemia [578469]  Admitting Physician: Leroy Sea [[id number]  Attending Physician: Leroy Sea Thérèse.Savory  Certification:: I certify there are rare and unusual circumstances requiring inpatient admission          B Medical/Surgery History Past Medical History:  Diagnosis Date   Acute on chronic respiratory failure with hypoxia (HCC)    Cardiac arrest (HCC)    Cardiac arrest (HCC)    Chronic atrial fibrillation (HCC)    COVID-19 virus infection    Hypertension    Past Surgical History:  Procedure Laterality Date   HIP SURGERY     IR REPLC GASTRO/COLONIC TUBE PERCUT W/FLUORO  03/16/2020   IR REPLC GASTRO/COLONIC TUBE PERCUT W/FLUORO  04/07/2020     A IV Location/Drains/Wounds Patient Lines/Drains/Airways Status     Active Line/Drains/Airways     Name  Placement date Placement time Site Days   Peripheral IV 12/05/22 20 G Left;Posterior;Proximal Forearm 12/05/22  1637  Forearm  less than 1            Intake/Output Last 24 hours  Intake/Output Summary (Last 24 hours) at 12/05/2022 2157 Last data filed at 12/05/2022 1912 Gross per 24 hour  Intake 1000 ml  Output --  Net 1000 ml    Labs/Imaging Results for orders placed or performed during the hospital encounter of 12/05/22 (from the past 48 hour(s))  CBC     Status: None   Collection Time: 12/05/22 12:15 PM  Result Value Ref Range   WBC 10.3 4.0 - 10.5 K/uL   RBC 4.98 4.22 - 5.81 MIL/uL   Hemoglobin 15.7 13.0 - 17.0 g/dL   HCT 62.9 52.8 - 41.3 %   MCV 99.2 80.0 - 100.0 fL   MCH 31.5 26.0 - 34.0 pg   MCHC 31.8 30.0 - 36.0 g/dL   RDW 24.4 01.0 - 27.2 %   Platelets 159 150 - 400 K/uL   nRBC 0.0 0.0 - 0.2 %    Comment: Performed at [facility], [address], [place], [place] [address]  Comprehensive metabolic panel     Status: Abnormal   Collection Time: 12/05/22 12:15 PM  Result Value Ref Range   Sodium 138 135 - 145 mmol/L   Potassium 5.6 (H) 3.5 - 5.1 mmol/L    Comment: HEMOLYSIS AT THIS LEVEL MAY AFFECT RESULT   Chloride  98 98 - 111 mmol/L   CO2 23 22 - 32 mmol/L   Glucose, Bld 91 70 - 99 mg/dL    Comment: Glucose reference range applies only to samples taken after fasting for at least 8 hours.   BUN 32 (H) 8 - 23 mg/dL   Creatinine, Ser 1.61 0.61 - 1.24 mg/dL   Calcium 8.7 (L) 8.9 - 10.3 mg/dL   Total Protein 5.7 (L) 6.5 - 8.1 g/dL   Albumin 2.8 (L) 3.5 - 5.0 g/dL   AST 40 15 - 41 U/L    Comment: HEMOLYSIS AT THIS LEVEL MAY AFFECT RESULT   ALT 30 0 - 44 U/L    Comment: HEMOLYSIS AT THIS LEVEL MAY AFFECT RESULT   Alkaline Phosphatase 47 38 - 126 U/L   Total Bilirubin 1.9 (H) <1.2 mg/dL    Comment: HEMOLYSIS AT THIS LEVEL MAY AFFECT RESULT   GFR, Estimated 60 (L) >60 mL/min    Comment: (NOTE) Calculated using the CKD-EPI Creatinine Equation (2021)     Anion gap 17 (H) 5 - 15    Comment: Performed at [facility], [address] [place], [place] [address]  TSH     Status: None   Collection Time: 12/05/22  4:47 PM  Result Value Ref Range   TSH 3.046 0.350 - 4.500 uIU/mL    Comment: Performed by a 3rd Generation assay with a functional sensitivity of <=0.01 uIU/mL. Performed at [facility], [address], [place], [place] [address]   Ammonia     Status: None   Collection Time: 12/05/22  4:47 PM  Result Value Ref Range   Ammonia 24 9 - 35 umol/L    Comment: HEMOLYSIS AT THIS LEVEL MAY AFFECT RESULT Performed at [facility], [address], [place], [place] [address]   C-reactive protein     Status: Abnormal   Collection Time: 12/05/22  4:47 PM  Result Value Ref Range   CRP 2.8 (H) <1.0 mg/dL    Comment: Performed at [facility], [address] [place], [place] [address]  Potassium     Status: None   Collection Time: 12/05/22  4:47 PM  Result Value Ref Range   Potassium 4.0 3.5 - 5.1 mmol/L    Comment: Performed at [facility], [address], [place], [place] [address]  Urinalysis, w/ Reflex to Culture (Infection Suspected) -Urine, Clean Catch     Status: Abnormal   Collection Time: 12/05/22  5:08 PM  Result Value Ref Range   Specimen Source URINE, CLEAN CATCH    Color, Urine AMBER (A) YELLOW    Comment: BIOCHEMICALS MAY BE AFFECTED BY COLOR   APPearance HAZY (A) CLEAR   Specific Gravity, Urine 1.018 1.005 - 1.030   pH 5.0 5.0 - 8.0   Glucose, UA NEGATIVE NEGATIVE mg/dL   Hgb urine dipstick MODERATE (A) NEGATIVE   Bilirubin Urine NEGATIVE NEGATIVE   Ketones, ur NEGATIVE NEGATIVE mg/dL   Protein, ur 30 (A) NEGATIVE mg/dL   Nitrite NEGATIVE NEGATIVE   Leukocytes,Ua TRACE (A) NEGATIVE   RBC / HPF 6-10 0 - 5 RBC/hpf   WBC, UA 0-5 0 - 5 WBC/hpf    Comment:        Reflex urine culture not performed if WBC <=10, OR if Squamous epithelial cells >5. If Squamous epithelial cells >5 suggest  recollection.    Bacteria, UA RARE (A) NONE SEEN   Squamous Epithelial / HPF 0-5 0 -  5 /HPF   Mucus PRESENT     Comment: Performed at [facility], [address] [place], [place] [address]   CT HEAD WO CONTRAST ( )  Result Date: 12/05/2022 CLINICAL DATA:  Altered mental status EXAM: CT HEAD WITHOUT CONTRAST TECHNIQUE: Contiguous axial images were obtained from the base of the skull through the vertex without intravenous contrast. RADIATION DOSE REDUCTION: This exam was performed according to the departmental dose-optimization program which includes automated exposure control, adjustment of the mA and/or kV according to patient size and/or use of iterative reconstruction technique. COMPARISON:  11/24/2022 FINDINGS: Brain: No evidence of acute infarction, hemorrhage, mass, mass effect, or midline shift. No hydrocephalus or extra-axial fluid collection. Periventricular white matter changes, likely the sequela of chronic small vessel ischemic disease. Age related cerebral atrophy. Vascular: No hyperdense vessel. Skull: Negative for fracture or focal lesion. Sinuses/Orbits: No acute finding. Other: The mastoid air cells are well aerated. Redemonstrated metallic fragments in the scalp. Incidental note is made of high density material lining the patient's tongue and palate, which is new from the prior exam IMPRESSION: 1. No acute intracranial process. 2. Incidental note is made of high density material lining the patient's tongue and palate, which is new from the prior exam. Correlate with physical exam. Electronically Signed   By: Wiliam Ke M.D.   On: 12/05/2022 18:25   DG Chest Port 1 View  Result Date: 12/05/2022 CLINICAL DATA:  Weakness. EXAM: PORTABLE CHEST 1 VIEW COMPARISON:  12/02/2022. FINDINGS: Low lung volume. Redemonstration of diffuse chronic interstitial markings throughout bilateral lungs with relative sparing of the left upper and mid lung zones, essentially similar to the prior  study. Findings may represent underlying lung fibrosis/scarring. No acute consolidation or lung collapse seen. There is probable small layering right pleural effusion. No significant left pleural effusion. No pneumothorax on either side. Stable cardio-mediastinal silhouette. No acute osseous abnormalities. The soft tissues are within normal limits. IMPRESSION: *Essentially stable exam. Redemonstration of diffuse chronic interstitial markings throughout bilateral lungs with relative sparing of the left upper and mid lung zones. *No acute consolidation or lung collapse. Electronically Signed   By: Jules Schick M.D.   On: 12/05/2022 14:00    Pending Labs Unresulted Labs (From admission, onward)     Start     Ordered   12/06/22 0500  Magnesium  Daily,   R     Question:  Specimen collection method  Answer:  Lab=Lab collect   12/05/22 1551   12/06/22 0500  CBC with Differential/Platelet  Daily,   R     Question:  Specimen collection method  Answer:  Lab=Lab collect   12/05/22 1551   12/06/22 0500  Basic metabolic panel  Daily,   R     Question:  Specimen collection method  Answer:  Lab=Lab collect   12/05/22 1551   12/06/22 0500  Brain natriuretic peptide  Daily,   R     Question:  Specimen collection method  Answer:  Lab=Lab collect   12/05/22 1551   12/06/22 0500  Phosphorus  Daily,   R     Question:  Specimen collection method  Answer:  Lab=Lab collect   12/05/22 1551   12/06/22 0500  C-reactive protein  Daily,   R      12/05/22 1553            Vitals/Pain Today's Vitals   12/05/22 1745 12/05/22 1800 12/05/22 1819 12/05/22 2145  BP: (!) 142/109 (!) 141/81  (!) 134/107  Pulse: (!) 108   62  Resp: 15 (!) 32  (!) 24  Temp:   (!) 95.7 F (35.4 C) (!) 97.4 F (36.3 C)  TempSrc:   Rectal Rectal  SpO2: 100%   96%  Weight:      Height:        Isolation Precautions No active isolations  Medications Medications  dextrose 5 % solution ( Intravenous New Bag/Given 12/05/22 1646)   amantadine (SYMMETREL) capsule 100 mg (has no administration in time range)  mirtazapine (REMERON) tablet 7.5 mg (7.5 mg Oral Not Given 12/05/22 2155)  acetaminophen (TYLENOL) tablet 650 mg (has no administration in time range)  senna-docusate (Senokot-S) tablet 2 tablet (has no administration in time range)  fluticasone furoate-vilanterol (BREO ELLIPTA) 200-25 MCG/ACT 1 puff (has no administration in time range)  ipratropium-albuterol (DUONEB) 0.5-2.5 (3) MG/3ML nebulizer solution 3 mL (has no administration in time range)  tamsulosin (FLOMAX) capsule 0.4 mg (has no administration in time range)  QUEtiapine (SEROQUEL) tablet 25 mg (25 mg Oral Not Given 12/05/22 2155)  metoprolol tartrate (LOPRESSOR) tablet 25 mg (25 mg Oral Not Given 12/05/22 2155)  apixaban (ELIQUIS) tablet 5 mg (5 mg Oral Not Given 12/05/22 2156)  methylPREDNISolone sodium succinate (SOLU-MEDROL) 125 mg/2 mL injection 60 mg (60 mg Intravenous Given 12/05/22 1647)  megestrol (MEGACE) tablet 40 mg (40 mg Oral Not Given 12/05/22 1632)  pantoprazole (PROTONIX) injection 40 mg (40 mg Intravenous Given 12/05/22 1647)  diltiazem (CARDIZEM) injection 10 mg (has no administration in time range)  hydrALAZINE (APRESOLINE) injection 10 mg (has no administration in time range)  ondansetron (ZOFRAN) tablet 4 mg (has no administration in time range)    Or  ondansetron (ZOFRAN) injection 4 mg (has no administration in time range)  haloperidol lactate (HALDOL) injection 2 mg (2 mg Intramuscular Given 12/05/22 1647)  nystatin (MYCOSTATIN) 100000 UNIT/ML suspension 500,000 Units (500,000 Units Oral Given 12/05/22 2145)  lactated ringers bolus 1,000 mL (0 mLs Intravenous Stopped 12/05/22 1912)  LORazepam (ATIVAN) injection 0.5 mg (0.5 mg Intravenous Given 12/05/22 2025)    Mobility non-ambulatory     Focused Assessments   Swallow screen pass? No  Cardiac Rhythm: Atrial fibrillation, Bundle branch block       R Recommendations: See  Admitting Provider Note  Report given to:   Additional Notes: Pt alert but speech not comprehensible. Failed swallow screen. Incontinent.

## 2022-12-05 NOTE — ED Notes (Signed)
Pt calm and resting at this time. Equal rise and fall of chest noted. Respirations even and unlabored.

## 2022-12-05 NOTE — Progress Notes (Signed)
Brief Palliative Medicine Progress Note:  PMT consult received - discussed case with Dr. Denton Lank.   PMT will complete consult re: "failure to thrive, progressive decline" tomorrow 12/5.  Thank you for allowing PMT to assist in the care of this patient.  Anis Cinelli M. Katrinka Blazing [facility] Palliative Medicine Team Team Phone: [phone] NO CHARGE

## 2022-12-05 NOTE — ED Notes (Signed)
Pt unable to redirect, crawling out of bed, repositioned.

## 2022-12-05 NOTE — ED Triage Notes (Addendum)
Pt bib ems from [facility]; staff called out for agitation, no decreased PO intake; discharged from hospital yesterday , admitted for pneumonia, sepsis; hx demenita; a and o x 1, ems reports this is pt's baseline; 138/90, HR 80s, cbg 153, 99% on 12L NRB at facility; currently on RA, no distress at present

## 2022-12-05 NOTE — ED Notes (Signed)
Received call back from admitting provider. Dr. Janalyn Shy updated on pt agitation and restlessness.

## 2022-12-05 NOTE — ED Notes (Signed)
Patient transported to CT 

## 2022-12-05 NOTE — ED Notes (Signed)
The Sa02 comes up briefly then doesn't

## 2022-12-05 NOTE — ED Notes (Signed)
I'm trying to get pt's Sa02 to pick up, but it won't. Pt's fingers are cold. I tried the forehead and ears and it won't pick up

## 2022-12-05 NOTE — ED Notes (Signed)
Attempted to call report. No answer.

## 2022-12-05 NOTE — ED Notes (Signed)
I placed pt on bair hugger

## 2022-12-05 NOTE — ED Notes (Signed)
Pt agitated and restless. Requiring constant redirection and repositioning.

## 2022-12-05 NOTE — ED Notes (Signed)
Pt pulling off cords, unable to redirect.

## 2022-12-05 NOTE — ED Notes (Signed)
Staffing contacted for safety sitter. No sitters available at this time.

## 2022-12-05 NOTE — ED Provider Notes (Addendum)
[place] [facility] AT [facility] Provider Note   CSN: [id number] Arrival date & time: 12/05/22  1205     History  Chief Complaint  Patient presents with   Weakness    Robert Carpenter is a 89 y.o. male.  Pt from SNF where patient was just d/c to yesterday after hospital stay. EMS was called re less responsive, generally weak, not eating or drinking anything. . On EMS arrival patient was conscious but not able to give history or specific complaint. EMS indicates CBG 153 and bp normal. Pt not able to provide additional history - level 5 caveat. No report of trauma or fall. No report of fevers.   The history is provided by the patient, medical records, the nursing home and the EMS personnel. The history is limited by the condition of the patient.       Home Medications Prior to Admission medications   Medication Sig Start Date End Date Taking? Authorizing Provider  acetaminophen (TYLENOL) 325 MG tablet Take 650 mg by mouth every 6 (six) hours as needed for headache, fever or mild pain (pain score 1-3).   Yes [provider]  amantadine (SYMMETREL) 100 MG capsule Take 100 mg by mouth daily.   Yes [provider]  apixaban (ELIQUIS) 5 MG TABS tablet Take 1 tablet (5 mg total) by mouth 2 (two) times daily. 12/04/22  Yes Leroy Sea, MD  BREO ELLIPTA 200-25 MCG/ACT AEPB Inhale 1 puff into the lungs daily. 11/06/22  Yes [provider]  ipratropium-albuterol (DUONEB) 0.5-2.5 (3) MG/3ML SOLN Take 3 mLs by nebulization in the morning, at noon, and at bedtime. 11/06/22  Yes [provider]  metoprolol tartrate (LOPRESSOR) 100 MG tablet Take 1 tablet (100 mg total) by mouth 2 (two) times daily. 12/04/22  Yes Leroy Sea, MD  mirtazapine (REMERON) 7.5 MG tablet Take 7.5 mg by mouth at bedtime.   Yes [provider]  Multiple Vitamin (MULTIVITAMIN WITH MINERALS) TABS tablet Take 1 tablet by mouth daily.   Yes [provider]  predniSONE (DELTASONE) 10 MG tablet Take 10 mg by mouth daily. 11/06/22  Yes [provider]  QUEtiapine (SEROQUEL) 25 MG tablet Take 1 tablet (25 mg total) by mouth 2 (two) times daily. 12/04/22  Yes Leroy Sea, MD  sennosides-docusate sodium (SENOKOT-S) 8.6-50 MG tablet Take 2 tablets by mouth in the morning and at bedtime.   Yes [provider]  sertraline (ZOLOFT) 50 MG tablet Take 50 mg by mouth daily.   Yes [provider]  tamsulosin (FLOMAX) 0.4 MG CAPS capsule Take 1 capsule (0.4 mg total) by mouth daily. 12/04/22  Yes Leroy Sea, MD  traZODone (DESYREL) 50 MG tablet Take 50 mg by mouth at bedtime. 11/07/22  Yes [provider]  VENTOLIN HFA 108 (90 Base) MCG/ACT inhaler Inhale 1 puff into the lungs every 6 (six) hours as needed for wheezing or shortness of breath. 08/24/22  Yes [provider]  pantoprazole (PROTONIX) 40 MG tablet Take 1 tablet (40 mg total) by mouth daily. Patient not taking: Reported on 12/05/2022 12/04/22   Leroy Sea, MD      Allergies    Patient has no known allergies.    Review of Systems   Review of Systems  Unable to perform ROS: Patient nonverbal    Physical Exam Updated Vital Signs BP 137/82   Pulse 88   Temp (!) 95.7 F (35.4 C) (Rectal)  Resp 14   SpO2 97%  Physical Exam Vitals and nursing note reviewed.  Constitutional:      Appearance: Normal appearance. He is well-developed.  HENT:     Head: Atraumatic.     Nose: Nose normal.     Mouth/Throat:     Mouth: Mucous membranes are dry.     Comments: Very dry MM.  Eyes:     General: No scleral icterus.    Conjunctiva/sclera: Conjunctivae normal.     Pupils: Pupils are equal, round, and reactive to light.  Neck:     Vascular: No carotid bruit.     Trachea: No tracheal deviation.     Comments: No stiffness or rigidity Cardiovascular:     Rate and Rhythm: Normal rate and regular rhythm.     Pulses: Normal pulses.      Heart sounds: Normal heart sounds. No murmur heard.    No friction rub. No gallop.  Pulmonary:     Effort: Pulmonary effort is normal. No accessory muscle usage or respiratory distress.     Breath sounds: Normal breath sounds.  Abdominal:     General: Bowel sounds are normal. There is no distension.     Palpations: Abdomen is soft.     Tenderness: There is no abdominal tenderness.  Genitourinary:    Comments: No cva tenderness. Musculoskeletal:        General: No swelling or tenderness.     Cervical back: Normal range of motion and neck supple. No rigidity.  Skin:    General: Skin is warm and dry.     Findings: No rash.  Neurological:     Mental Status: He is alert.     Comments: Lethargic, confused, slow to respond.  Moves extremities purposefully. Does not follow commands. Makes inaudible sounds.      ED Results / Procedures / Treatments   Labs (all labs ordered are listed, but only abnormal results are displayed) Results for orders placed or performed during the hospital encounter of 12/05/22  CBC  Result Value Ref Range   WBC 10.3 4.0 - 10.5 K/uL   RBC 4.98 4.22 - 5.81 MIL/uL   Hemoglobin 15.7 13.0 - 17.0 g/dL   HCT 19.1 47.8 - 29.5 %   MCV 99.2 80.0 - 100.0 fL   MCH 31.5 26.0 - 34.0 pg   MCHC 31.8 30.0 - 36.0 g/dL   RDW 62.1 30.8 - 65.7 %   Platelets 159 150 - 400 K/uL   nRBC 0.0 0.0 - 0.2 %  Comprehensive metabolic panel  Result Value Ref Range   Sodium 138 135 - 145 mmol/L   Potassium 5.6 (H) 3.5 - 5.1 mmol/L   Chloride 98 98 - 111 mmol/L   CO2 23 22 - 32 mmol/L   Glucose, Bld 91 70 - 99 mg/dL   BUN 32 (H) 8 - 23 mg/dL   Creatinine, Ser 8.46 0.61 - 1.24 mg/dL   Calcium 8.7 (L) 8.9 - 10.3 mg/dL   Total Protein 5.7 (L) 6.5 - 8.1 g/dL   Albumin 2.8 (L) 3.5 - 5.0 g/dL   AST 40 15 - 41 U/L   ALT 30 0 - 44 U/L   Alkaline Phosphatase 47 38 - 126 U/L   Total Bilirubin 1.9 (H) <1.2 mg/dL   GFR, Estimated 60 (L) >60 mL/min   Anion gap 17 (H) 5 - 15     EKG EKG Interpretation Date/Time:  Wednesday December 05 2022 14:43:09 EST Ventricular Rate:  95 PR Interval:  QRS Duration:  145 QT Interval:  405 QTC Calculation: 510 R Axis:   -58  Text Interpretation: Atrial fibrillation Ventricular premature complex Left bundle branch block Confirmed by Cathren Laine ([id number]) on 12/05/2022 3:27:03 PM  Radiology DG Chest Port 1 View  Result Date: 12/05/2022 CLINICAL DATA:  Weakness. EXAM: PORTABLE CHEST 1 VIEW COMPARISON:  12/02/2022. FINDINGS: Low lung volume. Redemonstration of diffuse chronic interstitial markings throughout bilateral lungs with relative sparing of the left upper and mid lung zones, essentially similar to the prior study. Findings may represent underlying lung fibrosis/scarring. No acute consolidation or lung collapse seen. There is probable small layering right pleural effusion. No significant left pleural effusion. No pneumothorax on either side. Stable cardio-mediastinal silhouette. No acute osseous abnormalities. The soft tissues are within normal limits. IMPRESSION: *Essentially stable exam. Redemonstration of diffuse chronic interstitial markings throughout bilateral lungs with relative sparing of the left upper and mid lung zones. *No acute consolidation or lung collapse. Electronically Signed   By: Jules Schick M.D.   On: 12/05/2022 14:00   DG Swallowing Func-Speech Pathology  Result Date: 12/03/2022 Table formatting from the original result was not included. Modified Barium Swallow Study Patient Details Name: Robert Carpenter MRN: [id number] Date of Birth: 1933/07/02 Today's Date: 12/03/2022 HPI/PMH: HPI: 89 year old man who is critically ill due to acute hypoxic and hypercarbic respiratory failure requiring NIV due to COVID pneumonia. He is SNF-bound at baseline, HFrEF and chronic Afib. He has baseline disorientation but is normally communicative. Pt has a history of cardiac arrest, trach and PEG to recover, now removed. Has  has several MBS, the last on 01/10/22 that showed mild residue that clears with a second swallow, no penetration or aspiration. He has had recurrent aspiration pna however. Recommended to consume Mech soft/thin liquids due to impairment in mastication. Clinical Impression: Clinical Impression: Pt presents with a significant oropharyngeal dysphagia characterized by impaired strength, timing, and control. Pt's cognition further impacts structural deficits resulting in posterior spillage of boluses prior to swallow initiation. This, in addition to incomplete anterior hyoid burst and subsequent deficient laryngeal vestibule closure, results in silent aspiration (PAS 8) of thin liquids and eventual sensed aspiration of nectar thick liquids (PAS 7). Limited trials of honey thick liquids and purees are penetrated above the vocal folds and only intermittently cleared. Pt has substantial residue that is not effectively cleared by unsuccessful attempts at subswallows given pt's base of tongue weakness, absent epiglottic inversion, and decreased pharyngeal stripping wave. Pt's velopharyngeal closure is incomplete, allowing boluses to consistently enter his nasal cavity. He was unable to follow commands to swallow, cough, or perform any additional compensatory strategies. Pt's deficits are severely exacerbated since prior [facility] 01/2022. His intake has been poor throughout this admission and he is at risk of aspiration given any texture due to diffuse oropharyngeal residue. Discussed extensively with pt's son and MD. Recommend engaging PMT for ongoing conversations regarding GOC. Pending these decisions, recommend continuing current diet of Dys 1 texture solids with honey thick liquids when pt is fully awake, alert, and accepting. Do not anticipate pt achieving adequate oral intake. Will continue to follow. Factors that may increase risk of adverse event in presence of aspiration Rubye Oaks & Clearance Coots 2021): Factors that may increase  risk of adverse event in presence of aspiration Rubye Oaks & Clearance Coots 2021): Poor general health and/or compromised immunity; Reduced cognitive function; Limited mobility; Frail or deconditioned; Dependence for feeding and/or oral hygiene; Inadequate oral hygiene; Weak cough; Aspiration of thick, dense, and/or  acidic materials Recommendations/Plan: Swallowing Evaluation Recommendations Swallowing Evaluation Recommendations Recommendations: PO diet PO Diet Recommendation: Dysphagia 1 (Pureed); Moderately thick liquids (Level 3, honey thick) Liquid Administration via: Cup; Straw; Spoon Medication Administration: Crushed with puree Supervision: Full assist for feeding; Full supervision/cueing for swallowing strategies Swallowing strategies  : Minimize environmental distractions; Slow rate; Small bites/sips; Check for pocketing or oral holding Postural changes: Position pt fully upright for meals; Stay upright 30-60 min after meals Oral care recommendations: Oral care QID (4x/day); Staff/trained caregiver to provide oral care; Use suctioning for oral care Recommended consults: Consider Palliative care Caregiver Recommendations: Avoid jello, ice cream, thin soups, popsicles; Remove water pitcher; Have oral suction available Treatment Plan Treatment Plan Treatment recommendations: Therapy as outlined in treatment plan below Follow-up recommendations: Skilled nursing-short term rehab (<3 hours/day) Functional status assessment: Patient has had a recent decline in their functional status and/or demonstrates limited ability to make significant improvements in function in a reasonable and predictable amount of time. Treatment frequency: Min 2x/week Treatment duration: 1 week Interventions: Aspiration precaution training; Compensatory techniques; Patient/family education; Trials of upgraded texture/liquids; Diet toleration management by SLP Recommendations Recommendations for follow up therapy are one component of a  multi-disciplinary discharge planning process, led by the attending physician.  Recommendations may be updated based on patient status, additional functional criteria and insurance authorization. Assessment: Orofacial Exam: Orofacial Exam Oral Cavity: Oral Hygiene: Dried secretions; Lingual coating Oral Cavity - Dentition: Edentulous Orofacial Anatomy: WFL Oral Motor/Sensory Function: WFL Anatomy: Anatomy: Suspected cervical osteophytes Boluses Administered: Boluses Administered Boluses Administered: Thin liquids (Level 0); Mildly thick liquids (Level 2, nectar thick); Moderately thick liquids (Level 3, honey thick); Puree  Oral Impairment Domain: Oral Impairment Domain Lip Closure: Interlabial escape, no progression to anterior lip Tongue control during bolus hold: Not tested Bolus preparation/mastication: Slow prolonged chewing/mashing with complete recollection Bolus transport/lingual motion: Delayed initiation of tongue motion (oral holding) Oral residue: Residue collection on oral structures Location of oral residue : Floor of mouth; Tongue; Palate Initiation of pharyngeal swallow : Pyriform sinuses  Pharyngeal Impairment Domain: Pharyngeal Impairment Domain Soft palate elevation: Escape to nasal cavity Laryngeal elevation: Partial superior movement of thyroid cartilage/partial approximation of arytenoids to epiglottic petiole Anterior hyoid excursion: Partial anterior movement Epiglottic movement: No inversion Laryngeal vestibule closure: Incomplete, narrow column air/contrast in laryngeal vestibule Pharyngeal stripping wave : Present - diminished Pharyngeal contraction (A/P view only): N/A Pharyngoesophageal segment opening: Complete distension and complete duration, no obstruction of flow Tongue base retraction: Wide column of contrast or air between tongue base and PPW Pharyngeal residue: Majority of contrast within or on pharyngeal structures Location of pharyngeal residue: Tongue base; Valleculae;  Pharyngeal wall; Diffuse (>3 areas)  Esophageal Impairment Domain: No data recorded Pill: No data recorded Penetration/Aspiration Scale Score: Penetration/Aspiration Scale Score 2.  Material enters airway, remains ABOVE vocal cords then ejected out: Puree 3.  Material enters airway, remains ABOVE vocal cords and not ejected out: Moderately thick liquids (Level 3, honey thick) 7.  Material enters airway, passes BELOW cords and not ejected out despite cough attempt by patient: Mildly thick liquids (Level 2, nectar thick) 8.  Material enters airway, passes BELOW cords without attempt by patient to eject out (silent aspiration) : Thin liquids (Level 0) Compensatory Strategies: Compensatory Strategies Compensatory strategies: No   General Information: Caregiver present: No  Diet Prior to this Study: Dysphagia 1 (pureed); Extremely thick liquids (Level 4, pudding thick)   Temperature : Normal   Respiratory Status: WFL   Supplemental O2: None (  Room air)   History of Recent Intubation: No  Behavior/Cognition: Confused; Agitated Self-Feeding Abilities: Needs hand-over-hand assist for feeding Baseline vocal quality/speech: Hypophonia/low volume Volitional Cough: Unable to elicit Volitional Swallow: Unable to elicit Exam Limitations: Fatigue Goal Planning: Prognosis for improved oropharyngeal function: Fair Barriers to Reach Goals: Cognitive deficits; Time post onset; Severity of deficits No data recorded Patient/Family Stated Goal: pt's son is thankful for more information Consulted and agree with results and recommendations: Patient; Family member/caregiver; Advanced practice provider Pain: Pain Assessment Pain Assessment: Faces Faces Pain Scale: 2 Pain Location: generalized with repositioning Pain Descriptors / Indicators: Discomfort; Grimacing Pain Intervention(s): Monitored during session End of Session: Start Time:SLP Start Time (ACUTE ONLY): 1519 Stop Time: SLP Stop Time (ACUTE ONLY): 1605 Time Calculation:SLP Time  Calculation (min) (ACUTE ONLY): 46 min Charges: SLP Evaluations $ SLP Speech Visit: 1 Visit SLP Evaluations $MBS Swallow: 1 Procedure $Swallowing Treatment: 1 Procedure SLP visit diagnosis: SLP Visit Diagnosis: Dysphagia, oropharyngeal phase (R13.12) Past Medical History: Past Medical History: Diagnosis Date  Acute on chronic respiratory failure with hypoxia (HCC)   Cardiac arrest (HCC)   Cardiac arrest (HCC)   Chronic atrial fibrillation (HCC)   COVID-19 virus infection   Hypertension  Past Surgical History: Past Surgical History: Procedure Laterality Date  HIP SURGERY    IR REPLC GASTRO/COLONIC TUBE PERCUT W/FLUORO  03/16/2020  IR REPLC GASTRO/COLONIC TUBE PERCUT W/FLUORO  04/07/2020 Gwynneth Aliment, M.A., CF-SLP Speech Language Pathology, Acute Rehabilitation Services Secure Chat preferred [phone] 12/03/2022, 5:08 PM   Procedures Procedures    Medications Ordered in ED Medications  lactated ringers bolus 1,000 mL (has no administration in time range)    ED Course/ Medical Decision Making/ A&P                                 Medical Decision Making Problems Addressed: Acute alteration in mental status: acute illness or injury with systemic symptoms that poses a threat to life or bodily functions    Details: Acute/chronic At high risk for aspiration: chronic illness or injury that poses a threat to life or bodily functions Confusion: acute illness or injury with systemic symptoms that poses a threat to life or bodily functions    Details: Acute/chronic Failure to thrive in adult: acute illness or injury with systemic symptoms that poses a threat to life or bodily functions    Details: Acute/chronic Generalized weakness: acute illness or injury with systemic symptoms that poses a threat to life or bodily functions    Details: Acute on chronic Severe protein-calorie malnutrition (HCC): chronic illness or injury with exacerbation, progression, or side effects of treatment that poses a threat to  life or bodily functions  Amount and/or Complexity of Data Reviewed Independent Historian: EMS    Details: hx External Data Reviewed: notes. Labs: ordered. Decision-making details documented in ED Course. Radiology: ordered and independent interpretation performed. Decision-making details documented in ED Course. ECG/medicine tests: ordered and independent interpretation performed. Decision-making details documented in ED Course. Discussion of management or test interpretation with external provider(s): medicine  Risk Prescription drug management. Decision regarding hospitalization.   Iv ns. Continuous pulse ox and cardiac monitoring. Labs ordered/sent. Imaging ordered.   Differential diagnosis includes dehydration, electrolyte abn, aki, etc. Dispo decision including potential need for admission considered - will get labs and imaging and reassess.   Reviewed nursing notes and prior charts for additional history. External reports reviewed. Additional history from: EMS.  Cardiac monitor: sinus rhythm, rate  Labs reviewed/interpreted by me - wbc and hgb normal. K elev, but hemolyzed, will repeat. Ua pending.   Xrays reviewed/interpreted by me - no def pna, similar to prior.   CT reviewed/interpreted by me - pending.   Of note, speech and language specialist that evaluated patient prior to his discharge yesterday noted persistent aspiration/aspiration risk, as well as noting too poor of po intake to maintain hydration or nutrition. D/c notes also reference family requesting full scope of care, although no actual palliative medicine consult or discussion noted in chart.  Palliative medicine team consulted, discussed pt, recent admission, aspiration/ftt/poor po, etc. - they indicate can see in hospital tomorrow AM in consult but not able to get to today.  Medicine consulted for admission. Discussed pt with hospitalists, will see/admit.   Signed out to oncoming EDP, Dr Suezanne Jacquet that ua,  repeat k, CT pending.            Final Clinical Impression(s) / ED Diagnoses Final diagnoses:  Acute alteration in mental status  Confusion  Generalized weakness  Severe protein-calorie malnutrition (HCC)  Failure to thrive in adult  At high risk for aspiration    Rx / DC Orders ED Discharge Orders     None          Cathren Laine, MD 12/05/22 1528

## 2022-12-06 ENCOUNTER — Other Ambulatory Visit: Payer: Self-pay

## 2022-12-06 ENCOUNTER — Inpatient Hospital Stay (HOSPITAL_COMMUNITY): Payer: Medicare Other

## 2022-12-06 ENCOUNTER — Other Ambulatory Visit (HOSPITAL_COMMUNITY): Payer: Medicare Other

## 2022-12-06 DIAGNOSIS — Z9189 Other specified personal risk factors, not elsewhere classified: Secondary | ICD-10-CM | POA: Diagnosis not present

## 2022-12-06 DIAGNOSIS — R638 Other symptoms and signs concerning food and fluid intake: Secondary | ICD-10-CM

## 2022-12-06 DIAGNOSIS — E860 Dehydration: Secondary | ICD-10-CM | POA: Diagnosis not present

## 2022-12-06 DIAGNOSIS — Z789 Other specified health status: Secondary | ICD-10-CM

## 2022-12-06 DIAGNOSIS — Z711 Person with feared health complaint in whom no diagnosis is made: Secondary | ICD-10-CM

## 2022-12-06 DIAGNOSIS — R531 Weakness: Secondary | ICD-10-CM

## 2022-12-06 DIAGNOSIS — R627 Adult failure to thrive: Secondary | ICD-10-CM

## 2022-12-06 DIAGNOSIS — Z7189 Other specified counseling: Secondary | ICD-10-CM

## 2022-12-06 DIAGNOSIS — R4182 Altered mental status, unspecified: Secondary | ICD-10-CM | POA: Diagnosis not present

## 2022-12-06 DIAGNOSIS — Z515 Encounter for palliative care: Secondary | ICD-10-CM

## 2022-12-06 DIAGNOSIS — E43 Unspecified severe protein-calorie malnutrition: Secondary | ICD-10-CM | POA: Diagnosis not present

## 2022-12-06 LAB — CBC WITH DIFFERENTIAL/PLATELET
Abs Immature Granulocytes: 0.07 10*3/uL (ref 0.00–0.07)
Basophils Absolute: 0 10*3/uL (ref 0.0–0.1)
Basophils Relative: 0 %
Eosinophils Absolute: 0 10*3/uL (ref 0.0–0.5)
Eosinophils Relative: 0 %
HCT: 39.5 % (ref 39.0–52.0)
Hemoglobin: 12.9 g/dL — ABNORMAL LOW (ref 13.0–17.0)
Immature Granulocytes: 1 %
Lymphocytes Relative: 5 %
Lymphs Abs: 0.4 10*3/uL — ABNORMAL LOW (ref 0.7–4.0)
MCH: 30.5 pg (ref 26.0–34.0)
MCHC: 32.7 g/dL (ref 30.0–36.0)
MCV: 93.4 fL (ref 80.0–100.0)
Monocytes Absolute: 0.4 10*3/uL (ref 0.1–1.0)
Monocytes Relative: 4 %
Neutro Abs: 7.5 10*3/uL (ref 1.7–7.7)
Neutrophils Relative %: 90 %
Platelets: 181 10*3/uL (ref 150–400)
RBC: 4.23 MIL/uL (ref 4.22–5.81)
RDW: 14.8 % (ref 11.5–15.5)
WBC: 8.3 10*3/uL (ref 4.0–10.5)
nRBC: 0 % (ref 0.0–0.2)

## 2022-12-06 LAB — BASIC METABOLIC PANEL
Anion gap: 8 (ref 5–15)
BUN: 32 mg/dL — ABNORMAL HIGH (ref 8–23)
CO2: 33 mmol/L — ABNORMAL HIGH (ref 22–32)
Calcium: 8.3 mg/dL — ABNORMAL LOW (ref 8.9–10.3)
Chloride: 95 mmol/L — ABNORMAL LOW (ref 98–111)
Creatinine, Ser: 1.36 mg/dL — ABNORMAL HIGH (ref 0.61–1.24)
GFR, Estimated: 50 mL/min — ABNORMAL LOW (ref >60)
Glucose, Bld: 204 mg/dL — ABNORMAL HIGH (ref 70–99)
Potassium: 4 mmol/L (ref 3.5–5.1)
Sodium: 136 mmol/L (ref 135–145)

## 2022-12-06 LAB — PHOSPHORUS: Phosphorus: 4.6 mg/dL (ref 2.5–4.6)

## 2022-12-06 LAB — OSMOLALITY, URINE: Osmolality, Ur: 736 mosm/kg (ref 300–900)

## 2022-12-06 LAB — GLUCOSE, CAPILLARY
Glucose-Capillary: 199 mg/dL — ABNORMAL HIGH (ref 70–99)
Glucose-Capillary: 175 mg/dL — ABNORMAL HIGH (ref 70–99)
Glucose-Capillary: 163 mg/dL — ABNORMAL HIGH (ref 70–99)

## 2022-12-06 LAB — MAGNESIUM: Magnesium: 1.9 mg/dL (ref 1.7–2.4)

## 2022-12-06 LAB — CREATININE, URINE, RANDOM: Creatinine, Urine: 159 mg/dL

## 2022-12-06 LAB — SODIUM, URINE, RANDOM: Sodium, Ur: 16 mmol/L

## 2022-12-06 LAB — URIC ACID: Uric Acid, Serum: 5.5 mg/dL (ref 3.7–8.6)

## 2022-12-06 LAB — OSMOLALITY: Osmolality: 313 mosm/kg — ABNORMAL HIGH (ref 275–295)

## 2022-12-06 LAB — BRAIN NATRIURETIC PEPTIDE: B Natriuretic Peptide: 1052.7 pg/mL — ABNORMAL HIGH (ref 0.0–100.0)

## 2022-12-06 LAB — C-REACTIVE PROTEIN: CRP: 2.3 mg/dL — ABNORMAL HIGH (ref <1.0)

## 2022-12-06 MED ORDER — PROSOURCE PLUS PO LIQD
30.0000 mL | Freq: Two times a day (BID) | ORAL | Status: DC
  Filled 2022-12-06: qty 30

## 2022-12-06 MED ORDER — LACTATED RINGERS IV SOLN: INTRAVENOUS | Status: AC

## 2022-12-06 MED ORDER — METHYLPREDNISOLONE SODIUM SUCC 40 MG IJ SOLR
30.0000 mg | INTRAMUSCULAR | Status: DC
  Administered 2022-12-06 – 2022-12-08 (×3): 30 mg via INTRAVENOUS
  Filled 2022-12-06 (×3): qty 1

## 2022-12-06 NOTE — TOC Initial Note (Signed)
Transition of Care [facility]) - Initial/Assessment Note    Patient Details  Name: Robert Carpenter MRN: [id number] Date of Birth: 05-03-1933  Transition of Care [facility]) CM/SW Contact:    [name], LCSW Phone Number: 12/06/2022, 8:52 AM  Clinical Narrative:                 Patient admitted from [facility] long term care with discharge from the hospital two days ago. Palliative care has been consulted. CSW will continue to follow for needs.   Expected Discharge Plan: Skilled Nursing Facility Barriers to Discharge: Continued Medical Work up   Patient Goals and CMS Choice            Expected Discharge Plan and Services In-house Referral: Clinical Social Work     Living arrangements for the past 2 months: Skilled Nursing Facility                                      Prior Living Arrangements/Services Living arrangements for the past 2 months: Skilled Nursing Facility Lives with:: Facility Resident Patient language and need for interpreter reviewed:: Yes Do you feel safe going back to the place where you live?: Yes      Need for Family Participation in Patient Care: Yes (Comment) Care giver support system in place?: Yes (comment)   Criminal Activity/Legal Involvement Pertinent to Current Situation/Hospitalization: No - Comment as needed  Activities of Daily Living   ADL Screening (condition at time of admission) Independently performs ADLs?: No Does the patient have a NEW difficulty with bathing/dressing/toileting/self-feeding that is expected to last >3 days?: Yes (Initiates electronic notice to provider for possible OT consult) Does the patient have a NEW difficulty with getting in/out of bed, walking, or climbing stairs that is expected to last >3 days?: Yes (Initiates electronic notice to provider for possible PT consult) Does the patient have a NEW difficulty with communication that is expected to last >3 days?: Yes (Initiates electronic notice to  provider for possible SLP consult) Is the patient deaf or have difficulty hearing?: No Does the patient have difficulty seeing, even when wearing glasses/contacts?: No Does the patient have difficulty concentrating, remembering, or making decisions?: Yes  Permission Sought/Granted Permission sought to share information with : Facility Medical sales representative, Family Supports Permission granted to share information with : No  Share Information with NAME: [name]  Permission granted to share info w AGENCY: [facility]  Permission granted to share info w Relationship: Son  Permission granted to share info w Contact Information: [phone]  Emotional Assessment Appearance:: Appears stated age Attitude/Demeanor/Rapport: Unable to Assess Affect (typically observed): Unable to Assess Orientation: :  (Disoriented x4) Alcohol / Substance Use: Not Applicable Psych Involvement: No (comment)  Admission diagnosis:  Dehydration [E86.0] Hyperkalemia [E87.5] Confusion [R41.0] Severe protein-calorie malnutrition (HCC) [E43] Failure to thrive in adult [R62.7] Generalized weakness [R53.1] At high risk for aspiration [Z91.89] Acute alteration in mental status [R41.82] Patient Active Problem List   Diagnosis Date Noted   Dehydration 12/05/2022   Hyperkalemia 12/05/2022   Protein-calorie malnutrition, severe 11/26/2022   Hypoxic respiratory failure (HCC) 11/25/2022   Hypercarbia 11/25/2022   Atrial flutter with rapid ventricular response (HCC) 11/25/2022   Acute encephalopathy 11/25/2022   Neurocognitive deficits 11/25/2022   Interstitial lung disease (HCC) 11/25/2022   Altered mental status 11/25/2022   Pneumonia due to COVID-19 virus 11/24/2022   Thyroid nodule 01/18/2021   Pulmonary  edema 01/18/2021   Chronic systolic CHF (congestive heart failure) (HCC) 01/18/2021   Acute respiratory failure with hypoxia and hypercapnia (HCC) 01/18/2021   HTN (hypertension) 01/13/2021   Chronic atrial  fibrillation (HCC)    COVID-19 virus infection    History of DVT (deep vein thrombosis) 02/19/2020   History of cardiac arrest 02/19/2020   PCP:  Shayne Alken, MD Pharmacy:  No Pharmacies Listed    Social Determinants of Health (SDOH) Social History: SDOH Screenings   Food Insecurity: Patient Unable To Answer (12/05/2022)  Housing: Patient Unable To Answer (12/05/2022)  Transportation Needs: Patient Unable To Answer (12/05/2022)  Utilities: Patient Unable To Answer (12/05/2022)  Financial Resource Strain: Low Risk  (12/23/2019)   Received from [facility]  Tobacco Use: Medium Risk (12/05/2022)   SDOH Interventions:     Readmission Risk Interventions    12/06/2022    8:51 AM  Readmission Risk Prevention Plan  Transportation Screening Complete  Medication Review (RN Care Manager) Complete  PCP or Specialist appointment within 3-5 days of discharge Complete  HRI or Home Care Consult Complete  SW Recovery Care/Counseling Consult Complete  Palliative Care Screening Complete  Skilled Nursing Facility Complete

## 2022-12-06 NOTE — Progress Notes (Signed)
PROGRESS NOTE                                                                                                                                                                                                             Patient Demographics:    Robert Carpenter, is a 89 y.o. male, DOB - November 18, 1933, ZOX:[id number]  Outpatient Primary MD for the patient is Shayne Alken, MD    LOS - 1  Admit date - 12/05/2022    Chief Complaint  Patient presents with   Weakness       Brief Narrative (HPI from H&P)   89 y.o. male, with known history of ILD, dysphagia, aspiration pneumonia, severe COVID-19 pneumonia infection in the past with reoccurrence few weeks ago requiring hospital admission was recently discharged on 12/04/2022, suspected undiagnosed early dementia, poor oral intake, failure to thrive, chronic A-fib on Eliquis, history of DVT, prior history of cardiac arrest, prior history of intubation requiring trach and PEG due to severe COVID-19 few years ago, chronic prednisone use.   Patient with above history was admitted to this hospital on 11/24/2022 from SNF with decreased mental status, decreased p.o. intake, failure to thrive, dehydration, he does have the symptoms at baseline but symptoms got considerably worse, he was diagnosed with another bout of COVID-19 infection with acute metabolic encephalopathy, poor oral intake severe dehydration and admitted to the hospital.  With supportive care he showed some improvement however his oral intake was very poor and he frequently required IV D50 to augment his CBGs.  We had long discussion with patient's son prior to his discharge on 12/04/2022 that his father's oral intake is very poor and that it looks like he is nearing his and, we encouraged him to think about comfort measures, he said he is quite religious and he will discuss this with his sister.   Patient subsequently went to SNF  however he did not eat or drink at all since he was at [facility] and he was sent to the hospital for dehydration and generalized weakness.  He is currently awake however he has encephalopathy and will not answer questions, appears to be in no distress.  Lab work consistent with dehydration, mild hyperkalemia, I was requested to admit him for poor oral intake, failure to thrive, metabolic encephalopathy, dehydration and hyperkalemia.   Subjective:  [name] today in bed quite confused, unable to provide any review of systems.   Assessment  & Plan :    Poor oral intake, failure to thrive, severe protein calorie malnutrition, cachexia, chronic history of poor oral intake, oropharyngeal dysphagia acute on chronic, dehydration, AKI.   Patient has had gradually progressive dysphagia and failure to thrive along with poor oral intake which has been ongoing for several months, he recently had COVID-19 infection few weeks ago which made the symptoms worse, he was discharged to SNF on 12/04/2022 and son was updated on patient's extremely poor oral intake and the likelihood of him not doing well, he was encouraged to think about comfort measures however he wanted to get some more time to think.   Unfortunately as expected patient did not eat or drink well at SNF and is back within a day, we will consult palliative care for goals of care, I have called the son again already however he still wants more time to think about future course of action for now he wants everything done and wants his dad to be full code.  Will commence with IV fluids with D5, continue Remeron and add Megace, he is on chronic 10 mg of oral prednisone for his underlying lung issues I will switch it to IV steroids which may augment his appetite, speech therapy will be reinvolved for his underlying dysphagia and he will be kept on soft diet as tolerated with feeding assistance and aspiration precautions.   His long-term prognosis again looks  quite poor and looks like he is gradually declining due to his advanced age and most likely underlying early dementia.     Dehydration, AKI with hyperkalemia.  IV fluids, renal ultrasound, avoid nephrotoxins and monitor.  All likely due to extremely poor oral intake.   Acute metabolic encephalopathy superimposed on dementia:  Etiology multifactorial likely due to oral intake on top of his underlying dementia, low-dose Seroquel, minimize benzodiazepines and narcotics, negative head CT, stable TSH and ammonia, UA does not show any UTI, continue to monitor bladder scans to rule out any retention.   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  [place] vas 2 score of greater than 3. Rate controlled continue beta-blocker and Eliquis.  As needed IV Cardizem.   Essential hypertension: As he is dehydrated will drop his home beta-blocker dose, as needed IV Cardizem and hydralazine on board.   Mild thrombocytopenia: Likely due to COVID-19 infection.  Monitor intermittently.   Recent acute urinary retention: Continue Flomax, monitor bladder scans, required Foley last admission.  Monitor.   Oropharyngeal dysphagia: Speech evaluated the patient now on a dysphagia 1 diet.  Dry oral mucosa with trach oral secretions coating his tongue, oral care per RN, nystatin swish and swallow although chances of him having fungal infection seem very less on exam.   History of severe COVID-19 infection in 2021 with prolonged vent requirement trach and PEG (decannulated and PEG removed)/now with pulmonary fibrosis:  Noted has been weaned to room air.  Oral steroids switched to IV due to poor oral intake, chest x-ray nonacute currently on room air.  Continue to monitor.         Condition - Extremely Guarded  Family Communication  :    son [name] [phone]  detail on 12/05/2022  Code Status :  Full  Consults  : Palliative care  PUD Prophylaxis : PPI   Procedures  :     CT head - non acute      Disposition Plan  :    Status is: Inpatient   DVT Prophylaxis  :    SCDs Start: 12/05/22 1556 apixaban (ELIQUIS) tablet 5 mg     Lab Results  Component Value Date   PLT 181 12/06/2022    Diet :  Diet Order             DIET - DYS 1 Room service appropriate? Yes; Fluid consistency: Honey Thick  Diet effective now                    Inpatient Medications  Scheduled Meds:  (feeding supplement) PROSource Plus  30 mL Oral BID BM   amantadine  100 mg Oral Daily   apixaban  5 mg Oral BID   fluticasone furoate-vilanterol  1 puff Inhalation Daily   megestrol  40 mg Oral Daily   methylPREDNISolone (SOLU-MEDROL) injection  30 mg Intravenous Q24H   metoprolol tartrate  25 mg Oral BID   mirtazapine  7.5 mg Oral QHS   nystatin  5 mL Oral QID   pantoprazole (PROTONIX) IV  40 mg Intravenous Q24H   QUEtiapine  25 mg Oral BID   tamsulosin  0.4 mg Oral Daily   Continuous Infusions:  lactated ringers     PRN Meds:.acetaminophen, diltiazem, haloperidol lactate, hydrALAZINE, ipratropium-albuterol, [DISCONTINUED] ondansetron **OR** ondansetron (ZOFRAN) IV, senna-docusate  Antibiotics  :    Anti-infectives (From admission, onward)    None         Objective:   Vitals:   12/05/22 2300 12/05/22 2339 12/06/22 0244 12/06/22 0845  BP: 120/81   (!) 146/120  Pulse: 84   93  Resp: 20 20  20   Temp:  (!) 96.5 F (35.8 C) 97.7 F (36.5 C) 97.6 F (36.4 C)  TempSrc:  Axillary Axillary Oral  SpO2: 98% 97%    Weight:      Height:        Wt Readings from Last 3 Encounters:  12/05/22 62.3 kg  12/04/22 62.3 kg  07/18/21 78.6 kg     Intake/Output Summary (Last 24 hours) at 12/06/2022 0915 Last data filed at 12/05/2022 1912 Gross per 24 hour  Intake 1000 ml  Output --  Net 1000 ml     Physical  Exam  Elderly, frail, cachectic African-American male, awake but confused,, No new F.N deficits, oral mucosa, thick dry oral secretions coating the tongue [place].AT,PERRAL Supple Neck, No JVD,   Symmetrical Chest wall movement, Good air movement bilaterally, CTAB RRR,No Gallops,Rubs or new Murmurs,  +ve B.Sounds, Abd Soft, No tenderness,   No Cyanosis, Clubbing or edema        Data Review:    Recent Labs  Lab 11/30/22 0450 12/02/22 0620 12/03/22 0517 12/05/22 1215 12/06/22 0457  WBC 9.3 7.6 5.9 10.3 8.3  HGB 14.5 14.8 14.2 15.7 12.9*  HCT 44.0 44.2 42.6 49.4 39.5  PLT 131* 142* 147* 159 181  MCV 93.6 95.3 93.4 99.2 93.4  MCH 30.9 31.9 31.1 31.5 30.5  MCHC 33.0 33.5 33.3 31.8 32.7  RDW 15.1 15.3 15.0 14.9 14.8  LYMPHSABS  --  0.8 0.8  --  0.4*  MONOABS  --  0.4 0.4  --  0.4  EOSABS  --  0.0 0.0  --  0.0  BASOSABS  --  0.0 0.0  --  0.0    Recent Labs  Lab 11/30/22 0450 12/02/22 0620 12/02/22 0956 12/03/22 0517 12/05/22 1215 12/05/22 1647 12/06/22 0457  NA 140 142  --  141 138  --  136  K 4.6 4.5  --  4.2 5.6* 4.0 4.0  CL 100 103  --  99 98  --  95*  CO2 31 26  --  31 23  --  33*  ANIONGAP 9 13  --  11 17*  --  8  GLUCOSE 109* 125*  --  150* 91  --  204*  BUN 20 31*  --  30* 32*  --  32*  CREATININE 1.06 1.04  --  1.20 1.18  --  1.36*  AST  --   --   --   --  40  --   --   ALT  --   --   --   --  30  --   --   ALKPHOS  --   --   --   --  47  --   --   BILITOT  --   --   --   --  1.9*  --   --   ALBUMIN  --   --   --   --  2.8*  --   --   CRP  --  6.7*  --  4.6*  --  2.8* 2.3*  PROCALCITON  --   --  0.10 0.10  --   --   --   TSH  --   --   --   --   --  3.046  --   AMMONIA  --   --   --   --   --  24  --   BNP  --  939.5*  --  1,115.5*  --   --  1,052.7*  MG 1.9 1.9  --  2.1  --   --  1.9  CALCIUM 8.7* 8.8*  --  8.7* 8.7*  --  8.3*      Recent Labs  Lab 11/30/22 0450 12/02/22 0620 12/02/22 0956 12/03/22 0517 12/05/22 1215 12/05/22 1647 12/06/22 0457   CRP  --  6.7*  --  4.6*  --  2.8* 2.3*  PROCALCITON  --   --  0.10 0.10  --   --   --   TSH  --   --   --   --   --  3.046  --   AMMONIA  --   --   --   --   --  24  --   BNP  --  939.5*  --  1,115.5*  --   --  1,052.7*  MG 1.9 1.9  --  2.1  --   --  1.9  CALCIUM 8.7* 8.8*  --  8.7* 8.7*  --  8.3*    --------------------------------------------------------------------------------------------------------------- No results found for: "CHOL", "HDL", "LDLCALC", "LDLDIRECT", "TRIG", "CHOLHDL"  Lab Results  Component Value Date   HGBA1C 5.8 (H) 03/10/2020   Recent Labs    12/05/22 1647  TSH 3.046   No results for input(s): "VITAMINB12", "FOLATE", "FERRITIN", "TIBC", "IRON", "RETICCTPCT" in the last 72 hours. ------------------------------------------------------------------------------------------------------------------ Cardiac Enzymes No results for input(s): "CKMB", "TROPONINI", "MYOGLOBIN" in the last 168 hours.  Invalid input(s): "CK"  Micro Results No results found for this or any previous visit (from the past 240 hour(s)).  Radiology Reports CT HEAD WO CONTRAST ( )  Result Date: 12/05/2022 CLINICAL  DATA:  Altered mental status EXAM: CT HEAD WITHOUT CONTRAST TECHNIQUE: Contiguous axial images were obtained from the base of the skull through the vertex without intravenous contrast. RADIATION DOSE REDUCTION: This exam was performed according to the departmental dose-optimization program which includes automated exposure control, adjustment of the mA and/or kV according to patient size and/or use of iterative reconstruction technique. COMPARISON:  11/24/2022 FINDINGS: Brain: No evidence of acute infarction, hemorrhage, mass, mass effect, or midline shift. No hydrocephalus or extra-axial fluid collection. Periventricular white matter changes, likely the sequela of chronic small vessel ischemic disease. Age related cerebral atrophy. Vascular: No hyperdense vessel. Skull: Negative for  fracture or focal lesion. Sinuses/Orbits: No acute finding. Other: The mastoid air cells are well aerated. Redemonstrated metallic fragments in the scalp. Incidental note is made of high density material lining the patient's tongue and palate, which is new from the prior exam IMPRESSION: 1. No acute intracranial process. 2. Incidental note is made of high density material lining the patient's tongue and palate, which is new from the prior exam. Correlate with physical exam. Electronically Signed   By: Wiliam Ke M.D.   On: 12/05/2022 18:25   DG Chest Port 1 View  Result Date: 12/05/2022 CLINICAL DATA:  Weakness. EXAM: PORTABLE CHEST 1 VIEW COMPARISON:  12/02/2022. FINDINGS: Low lung volume. Redemonstration of diffuse chronic interstitial markings throughout bilateral lungs with relative sparing of the left upper and mid lung zones, essentially similar to the prior study. Findings may represent underlying lung fibrosis/scarring. No acute consolidation or lung collapse seen. There is probable small layering right pleural effusion. No significant left pleural effusion. No pneumothorax on either side. Stable cardio-mediastinal silhouette. No acute osseous abnormalities. The soft tissues are within normal limits. IMPRESSION: *Essentially stable exam. Redemonstration of diffuse chronic interstitial markings throughout bilateral lungs with relative sparing of the left upper and mid lung zones. *No acute consolidation or lung collapse. Electronically Signed   By: Jules Schick M.D.   On: 12/05/2022 14:00   DG Swallowing Func-Speech Pathology  Result Date: 12/03/2022 Table formatting from the original result was not included. Modified Barium Swallow Study Patient Details Name: RYVER ADDAMS MRN: [id number] Date of Birth: Dec 07, 1933 Today's Date: 12/03/2022 HPI/PMH: HPI: 89 year old man who is critically ill due to acute hypoxic and hypercarbic respiratory failure requiring NIV due to COVID pneumonia. He is  SNF-bound at baseline, HFrEF and chronic Afib. He has baseline disorientation but is normally communicative. Pt has a history of cardiac arrest, trach and PEG to recover, now removed. Has has several MBS, the last on 01/10/22 that showed mild residue that clears with a second swallow, no penetration or aspiration. He has had recurrent aspiration pna however. Recommended to consume Mech soft/thin liquids due to impairment in mastication. Clinical Impression: Clinical Impression: Pt presents with a significant oropharyngeal dysphagia characterized by impaired strength, timing, and control. Pt's cognition further impacts structural deficits resulting in posterior spillage of boluses prior to swallow initiation. This, in addition to incomplete anterior hyoid burst and subsequent deficient laryngeal vestibule closure, results in silent aspiration (PAS 8) of thin liquids and eventual sensed aspiration of nectar thick liquids (PAS 7). Limited trials of honey thick liquids and purees are penetrated above the vocal folds and only intermittently cleared. Pt has substantial residue that is not effectively cleared by unsuccessful attempts at subswallows given pt's base of tongue weakness, absent epiglottic inversion, and decreased pharyngeal stripping wave. Pt's velopharyngeal closure is incomplete, allowing boluses to consistently enter his nasal  cavity. He was unable to follow commands to swallow, cough, or perform any additional compensatory strategies. Pt's deficits are severely exacerbated since prior [facility] 01/2022. His intake has been poor throughout this admission and he is at risk of aspiration given any texture due to diffuse oropharyngeal residue. Discussed extensively with pt's son and MD. Recommend engaging PMT for ongoing conversations regarding GOC. Pending these decisions, recommend continuing current diet of Dys 1 texture solids with honey thick liquids when pt is fully awake, alert, and accepting. Do not anticipate  pt achieving adequate oral intake. Will continue to follow. Factors that may increase risk of adverse event in presence of aspiration Rubye Oaks & Clearance Coots 2021): Factors that may increase risk of adverse event in presence of aspiration Rubye Oaks & Clearance Coots 2021): Poor general health and/or compromised immunity; Reduced cognitive function; Limited mobility; Frail or deconditioned; Dependence for feeding and/or oral hygiene; Inadequate oral hygiene; Weak cough; Aspiration of thick, dense, and/or acidic materials Recommendations/Plan: Swallowing Evaluation Recommendations Swallowing Evaluation Recommendations Recommendations: PO diet PO Diet Recommendation: Dysphagia 1 (Pureed); Moderately thick liquids (Level 3, honey thick) Liquid Administration via: Cup; Straw; Spoon Medication Administration: Crushed with puree Supervision: Full assist for feeding; Full supervision/cueing for swallowing strategies Swallowing strategies  : Minimize environmental distractions; Slow rate; Small bites/sips; Check for pocketing or oral holding Postural changes: Position pt fully upright for meals; Stay upright 30-60 min after meals Oral care recommendations: Oral care QID (4x/day); Staff/trained caregiver to provide oral care; Use suctioning for oral care Recommended consults: Consider Palliative care Caregiver Recommendations: Avoid jello, ice cream, thin soups, popsicles; Remove water pitcher; Have oral suction available Treatment Plan Treatment Plan Treatment recommendations: Therapy as outlined in treatment plan below Follow-up recommendations: Skilled nursing-short term rehab (<3 hours/day) Functional status assessment: Patient has had a recent decline in their functional status and/or demonstrates limited ability to make significant improvements in function in a reasonable and predictable amount of time. Treatment frequency: Min 2x/week Treatment duration: 1 week Interventions: Aspiration precaution training; Compensatory techniques;  Patient/family education; Trials of upgraded texture/liquids; Diet toleration management by SLP Recommendations Recommendations for follow up therapy are one component of a multi-disciplinary discharge planning process, led by the attending physician.  Recommendations may be updated based on patient status, additional functional criteria and insurance authorization. Assessment: Orofacial Exam: Orofacial Exam Oral Cavity: Oral Hygiene: Dried secretions; Lingual coating Oral Cavity - Dentition: Edentulous Orofacial Anatomy: WFL Oral Motor/Sensory Function: WFL Anatomy: Anatomy: Suspected cervical osteophytes Boluses Administered: Boluses Administered Boluses Administered: Thin liquids (Level 0); Mildly thick liquids (Level 2, nectar thick); Moderately thick liquids (Level 3, honey thick); Puree  Oral Impairment Domain: Oral Impairment Domain Lip Closure: Interlabial escape, no progression to anterior lip Tongue control during bolus hold: Not tested Bolus preparation/mastication: Slow prolonged chewing/mashing with complete recollection Bolus transport/lingual motion: Delayed initiation of tongue motion (oral holding) Oral residue: Residue collection on oral structures Location of oral residue : Floor of mouth; Tongue; Palate Initiation of pharyngeal swallow : Pyriform sinuses  Pharyngeal Impairment Domain: Pharyngeal Impairment Domain Soft palate elevation: Escape to nasal cavity Laryngeal elevation: Partial superior movement of thyroid cartilage/partial approximation of arytenoids to epiglottic petiole Anterior hyoid excursion: Partial anterior movement Epiglottic movement: No inversion Laryngeal vestibule closure: Incomplete, narrow column air/contrast in laryngeal vestibule Pharyngeal stripping wave : Present - diminished Pharyngeal contraction (A/P view only): N/A Pharyngoesophageal segment opening: Complete distension and complete duration, no obstruction of flow Tongue base retraction: Wide column of contrast or  air between tongue base and PPW Pharyngeal  residue: Majority of contrast within or on pharyngeal structures Location of pharyngeal residue: Tongue base; Valleculae; Pharyngeal wall; Diffuse (>3 areas)  Esophageal Impairment Domain: No data recorded Pill: No data recorded Penetration/Aspiration Scale Score: Penetration/Aspiration Scale Score 2.  Material enters airway, remains ABOVE vocal cords then ejected out: Puree 3.  Material enters airway, remains ABOVE vocal cords and not ejected out: Moderately thick liquids (Level 3, honey thick) 7.  Material enters airway, passes BELOW cords and not ejected out despite cough attempt by patient: Mildly thick liquids (Level 2, nectar thick) 8.  Material enters airway, passes BELOW cords without attempt by patient to eject out (silent aspiration) : Thin liquids (Level 0) Compensatory Strategies: Compensatory Strategies Compensatory strategies: No   General Information: Caregiver present: No  Diet Prior to this Study: Dysphagia 1 (pureed); Extremely thick liquids (Level 4, pudding thick)   Temperature : Normal   Respiratory Status: WFL   Supplemental O2: None (Room air)   History of Recent Intubation: No  Behavior/Cognition: Confused; Agitated Self-Feeding Abilities: Needs hand-over-hand assist for feeding Baseline vocal quality/speech: Hypophonia/low volume Volitional Cough: Unable to elicit Volitional Swallow: Unable to elicit Exam Limitations: Fatigue Goal Planning: Prognosis for improved oropharyngeal function: Fair Barriers to Reach Goals: Cognitive deficits; Time post onset; Severity of deficits No data recorded Patient/Family Stated Goal: pt's son is thankful for more information Consulted and agree with results and recommendations: Patient; Family member/caregiver; Advanced practice provider Pain: Pain Assessment Pain Assessment: Faces Faces Pain Scale: 2 Pain Location: generalized with repositioning Pain Descriptors / Indicators: Discomfort; Grimacing Pain  Intervention(s): Monitored during session End of Session: Start Time:SLP Start Time (ACUTE ONLY): 1519 Stop Time: SLP Stop Time (ACUTE ONLY): 1605 Time Calculation:SLP Time Calculation (min) (ACUTE ONLY): 46 min Charges: SLP Evaluations $ SLP Speech Visit: 1 Visit SLP Evaluations $MBS Swallow: 1 Procedure $Swallowing Treatment: 1 Procedure SLP visit diagnosis: SLP Visit Diagnosis: Dysphagia, oropharyngeal phase (R13.12) Past Medical History: Past Medical History: Diagnosis Date  Acute on chronic respiratory failure with hypoxia (HCC)   Cardiac arrest (HCC)   Cardiac arrest (HCC)   Chronic atrial fibrillation (HCC)   COVID-19 virus infection   Hypertension  Past Surgical History: Past Surgical History: Procedure Laterality Date  HIP SURGERY    IR REPLC GASTRO/COLONIC TUBE PERCUT W/FLUORO  03/16/2020  IR REPLC GASTRO/COLONIC TUBE PERCUT W/FLUORO  04/07/2020 Marlou Porch., CF-SLP Speech Language Pathology, Acute Rehabilitation Services Secure Chat preferred [phone] 12/03/2022, 5:08 PM     Signature  -   Susa Raring M.D on 12/06/2022 at 9:15 AM   -  To page go to [email or web address]

## 2022-12-06 NOTE — Consult Note (Signed)
Consultation Note Date: 12/06/2022   Patient Name: Robert Carpenter  DOB: October 07, 1933  MRN: [id number]  Age / Sex: 89 y.o., male  PCP: Robert Alken, MD Referring Physician: Leroy Sea, MD  Reason for Consultation: Establishing goals of care, "failure to thrive, progressive decline"  HPI/Patient Profile: 89 y.o. male  with past medical history of ILD, dysphagia, CHF, cardiac arrest, suspect undiagnosed dementia was admitted on 12/05/2022 with poor oral intake/failure to thrive/severe protein calorie malnutrition/chachexia/acute on chronic oropharyngeal dysphagia, dehydration with hyperkalemia, and acute metabolic encephalopathy superimposed on likely dementia.   Of note, patient had recent hospitalization from 11/23-12/3/24 for COVID 19 infection, failure to thrive, dehydration. H&P notes: with supportive care he showed some improvement however his oral intake was very poor and he frequently required IV D50 to augment his CBGs. There was a long discussion with patient's son prior to his discharge on 12/04/2022 that his father's oral intake is very poor and that it looks like he is nearing his end, comfort measures were encouraged, he said he is quite religious and he will discuss this with his sister. PMT was not involved in these GOC discussions.  Clinical Assessment and Goals of Care: I have reviewed medical records including EPIC notes, labs, and imaging. Received report from primary RN - no acute concerns. RN reports patient has been lethargic, only responding to painful stimuli, not tolerating/accepting orals.    Went to visit patient at bedside - no family/visitors present. Patient was lying in bed - his eyes are closed and two nurses are at bedside attempting to place an IV. Patient does not open eyes or respond to IV attempt. Signs/non-verbal gestures of discomfort noted. No respiratory distress,  increased work of breathing, or secretions noted. He appears very frail.  10:15 AM Called [name] - emotional support provided. He is not able to talk at this time due to work - requests return call at 12:30p. Offered other siblings to join - he will include his sister at that time.  12:30 PM Attempted to call son - no answer.  12:35 PM Was able to speak with [name] and [name] via conference call.   Met with Robert Carpenter and Robert Carpenter  to discuss diagnosis, prognosis, GOC, EOL wishes, disposition, and options.  I introduced Palliative Medicine as specialized medical care for people living with serious illness. It focuses on providing relief from the symptoms and stress of a serious illness. The goal is to improve quality of life for both the patient and the family.  We discussed a brief life review of the patient as well as functional and nutritional status. Prior to this one and previous hospitalization, patient was living at [facility] - he has resided here for two years. At facility, patient was able to ambulate with a walker and dress/bathe himself. Family describe him as "very active" - he was a bowler, golfer, sang in a choir. He worked a full time job until he first got COVID two years ago. Patient has had COVID multiple times and has showed gradual decline with each infection. They tell me, "we know our dad and COVID took him through it."  Robert Carpenter states, "I thought we were going to talk about him getting a feeding tube?" Validated we would certainly review the risks/benefits of artificial feeding during our discussion today.  We discussed patient's current illness and what it means in the larger context of patient's on-going co-morbidities. Family have a clear understanding of patient's current acute medical situation.  Expressed concern that patient exhibited changes in three major domains of life - functional status, nutrition, and cognition - over the last  several months and this can sometimes indicate that someone is declining toward end of life. Concept of human mortality and the limitations of medical interventions to prolong quality of life when the body fails to thrive was explored. Education offered on the limitations of medical interventions to prolong quality of life when the body fails to thrive. Provided updates on patient's clinical presentation as noted above. Expressed concern that patient is likely approaching end of life. Natural disease trajectory and expectations at EOL were discussed. I attempted to elicit values and goals of care important to the patient. The difference between aggressive medical intervention and comfort care was considered in light of the patient's goals of care.   Concepts regarding PEG tube were discussed in detail. SLP notes reviewed with family. We discussed the lack of evidence that tube feeding is beneficial in dying patient's as well as doesn't prevent aspiration or improve wound healing. The burdens and complications associated with PEG tube feeding to include but not limited to: Pain at tube site, diarrhea, nausea, vomiting, fluid overload, tube malfunction, aspiration, cellulitis. Reviewed careful hand feeding for enjoyment with aspiration precautions would be recommended over PEG. I did explain that artificial feeding will give him nutrition but will not do anything to help him improve to have appetite or improved swallowing.   We discussed that time is limited no matter which path is chosen (aggressive vs comfort).   Encouraged family to consider DNR/DNI status understanding evidenced based poor outcomes in similar hospitalized patient, as the cause of arrest is likely associated with advanced chronic/terminal illness rather than an easily reversible acute cardio-pulmonary event.  I shared that even if we pursued resuscitation we would not able to resolve the underlying factors. I explained that DNR/DNI does not  change the medical plan and it only comes into effect after a person has arrested (died).  It is a protective measure to keep [place] from harming the patient in their last moments of life.   Recommendation today was given for comfort/hospice care, no PEG, comfort feeds with known aspiration risk, and DNR/DNI.   At this time, Robert Carpenter and [facility] request time to discuss information given to them today further with each other and Robert Carpenter's wife prior to making final decisions. They understand patient's poor prognosis and clinical condition - encouraged discussions/decisions sooner than later.  Discussed with patient/family the importance of continued conversation with each other and the medical providers regarding overall plan of care and treatment options, ensuring decisions are within the context of the patient's values and GOCs.    Questions and concerns were addressed. The patient/family was encouraged to call with questions and/or concerns. PMT number was provided.   Primary Decision Maker: NEXT OF KIN - children    SUMMARY OF RECOMMENDATIONS   Continue full code/full scope at this time Family request time to discuss information given to them today. They are open to PMT following up tomorrow 12/6 for final decisions Recommendation was given for comfort/hospice care, no PEG, comfort feeds with known aspiration risk, and DNR/DNI PMT will continue to follow and support holistically   Code Status/Advance Care Planning: Full code  Palliative Prophylaxis:  Aspiration, Delirium Protocol, Frequent Pain Assessment, Oral Care, and Turn Reposition  Additional Recommendations (Limitations, Scope, Preferences): Full Scope Treatment  Psycho-social/Spiritual:  Desire for further Chaplaincy support:no Created space and opportunity for patient and family to  express thoughts and feelings regarding patient's current medical situation.  Emotional support and therapeutic listening provided.  Prognosis:   Poor  Discharge Planning: To Be Determined      Primary Diagnoses: Present on Admission:  Dehydration  Hyperkalemia   I have reviewed the medical record, interviewed the patient and family, and examined the patient. The following aspects are pertinent.  Past Medical History:  Diagnosis Date   Acute on chronic respiratory failure with hypoxia (HCC)    Cardiac arrest (HCC)    Cardiac arrest (HCC)    Chronic atrial fibrillation (HCC)    COVID-19 virus infection    Hypertension    Social History   Socioeconomic History   Marital status: Married    Spouse name: Not on file   Number of children: Not on file   Years of education: Not on file   Highest education level: Not on file  Occupational History   Not on file  Tobacco Use   Smoking status: Former    Current packs/day: 0.00    Types: Cigarettes    Quit date: 1970    Years since quitting: 54.9   Smokeless tobacco: Never  Substance and Sexual Activity   Alcohol use: Not Currently   Drug use: Never   Sexual activity: Not on file  Other Topics Concern   Not on file  Social History Narrative   Not on file   Social Determinants of Health   Financial Resource Strain: Low Risk  (12/23/2019)   Received from [facility]   Overall Financial Resource Strain (CARDIA)    Difficulty of Paying Living Expenses: Not very hard  Food Insecurity: Patient Unable To Answer (12/05/2022)   Hunger Vital Sign    Worried About Running Out of Food in the Last Year: Patient unable to answer    Ran Out of Food in the Last Year: Patient unable to answer  Transportation Needs: Patient Unable To Answer (12/05/2022)   PRAPARE - Transportation    Lack of Transportation (Medical): Patient unable to answer    Lack of Transportation (Non-Medical): Patient unable to answer  Physical Activity: Not on file  Stress: Not on file  Social Connections: Not on file   History reviewed. No pertinent family history. Scheduled  Meds:  (feeding supplement) PROSource Plus  30 mL Oral BID BM   amantadine  100 mg Oral Daily   apixaban  5 mg Oral BID   fluticasone furoate-vilanterol  1 puff Inhalation Daily   megestrol  40 mg Oral Daily   methylPREDNISolone (SOLU-MEDROL) injection  30 mg Intravenous Q24H   metoprolol tartrate  25 mg Oral BID   mirtazapine  7.5 mg Oral QHS   nystatin  5 mL Oral QID   pantoprazole (PROTONIX) IV  40 mg Intravenous Q24H   QUEtiapine  25 mg Oral BID   tamsulosin  0.4 mg Oral Daily   Continuous Infusions:  lactated ringers 50 mL/hr at 12/06/22 0928   PRN Meds:.acetaminophen, diltiazem, haloperidol lactate, hydrALAZINE, ipratropium-albuterol, [DISCONTINUED] ondansetron **OR** ondansetron (ZOFRAN) IV, senna-docusate Medications Prior to Admission:  Prior to Admission medications   Medication Sig Start Date End Date Taking? Authorizing Provider  acetaminophen (TYLENOL) 325 MG tablet Take 650 mg by mouth every 6 (six) hours as needed for headache, fever or mild pain (pain score 1-3).   Yes [provider]  amantadine (SYMMETREL) 100 MG capsule Take 100 mg by mouth daily.   Yes [provider]  apixaban (ELIQUIS) 5 MG  TABS tablet Take 1 tablet (5 mg total) by mouth 2 (two) times daily. 12/04/22  Yes Robert Sea, MD  BREO ELLIPTA 200-25 MCG/ACT AEPB Inhale 1 puff into the lungs daily. 11/06/22  Yes [provider]  ipratropium-albuterol (DUONEB) 0.5-2.5 (3) MG/3ML SOLN Take 3 mLs by nebulization in the morning, at noon, and at bedtime. 11/06/22  Yes [provider]  metoprolol tartrate (LOPRESSOR) 100 MG tablet Take 1 tablet (100 mg total) by mouth 2 (two) times daily. 12/04/22  Yes Robert Sea, MD  mirtazapine (REMERON) 7.5 MG tablet Take 7.5 mg by mouth at bedtime.   Yes [provider]  Multiple Vitamin (MULTIVITAMIN WITH MINERALS) TABS tablet Take 1 tablet by mouth daily.   Yes [provider]  predniSONE (DELTASONE) 10 MG tablet  Take 10 mg by mouth daily. 11/06/22  Yes [provider]  QUEtiapine (SEROQUEL) 25 MG tablet Take 1 tablet (25 mg total) by mouth 2 (two) times daily. 12/04/22  Yes Robert Sea, MD  sennosides-docusate sodium (SENOKOT-S) 8.6-50 MG tablet Take 2 tablets by mouth in the morning and at bedtime.   Yes [provider]  sertraline (ZOLOFT) 50 MG tablet Take 50 mg by mouth daily.   Yes [provider]  tamsulosin (FLOMAX) 0.4 MG CAPS capsule Take 1 capsule (0.4 mg total) by mouth daily. 12/04/22  Yes Robert Sea, MD  traZODone (DESYREL) 50 MG tablet Take 50 mg by mouth at bedtime. 11/07/22  Yes [provider]  VENTOLIN HFA 108 (90 Base) MCG/ACT inhaler Inhale 1 puff into the lungs every 6 (six) hours as needed for wheezing or shortness of breath. 08/24/22  Yes [provider]  pantoprazole (PROTONIX) 40 MG tablet Take 1 tablet (40 mg total) by mouth daily. Patient not taking: Reported on 12/05/2022 12/04/22   Robert Sea, MD   No Known Allergies Review of Systems  Unable to perform ROS: Acuity of condition    Physical Exam Vitals and nursing note reviewed.  Constitutional:      General: He is not in acute distress.    Appearance: He is cachectic. He is ill-appearing.  Pulmonary:     Effort: No respiratory distress.  Skin:    General: Skin is warm and dry.  Neurological:     Mental Status: He is lethargic.     Motor: Weakness present.     Vital Signs: BP (!) 146/120 (BP Location: Right Arm)   Pulse 93   Temp 97.6 F (36.4 C) (Oral)   Resp 20   Ht 6\' 2"  (1.88 m)   Wt 62.3 kg   SpO2 95%   BMI 17.63 kg/m  Pain Scale: PAINAD   Pain Score: 0-No pain   SpO2: SpO2: 95 % O2 Device:SpO2: 95 % O2 Flow Rate: .O2 Flow Rate (L/min): 3 L/min  IO: Intake/output summary:  Intake/Output Summary (Last 24 hours) at 12/06/2022 0932 Last data filed at 12/05/2022 1912 Gross per 24 hour  Intake 1000 ml  Output --  Net 1000 ml    LBM:    Baseline Weight: Weight: 62.3 kg Most recent weight: Weight: 62.3 kg     Palliative Assessment/Data: PPS 10%      Time In/Out: 0950-1020/1230-1330 Time Total: 90 minutes  Signed by: Haskel Khan, NP   Please contact Palliative Medicine Team phone at [phone] for questions and concerns.  For individual provider: See Amion  *Portions of this note are a verbal dictation therefore any spelling and/or grammatical  errors are due to the "[employer]agon [employer] One" system interpretation.

## 2022-12-06 NOTE — Plan of Care (Signed)
  Problem: Education: Goal: Knowledge of risk factors and measures for prevention of condition will improve Outcome: Progressing   Problem: Coping: Goal: Psychosocial and spiritual needs will be supported Outcome: Progressing   Problem: Respiratory: Goal: Will maintain a patent airway Outcome: Progressing Goal: Complications related to the disease process, condition or treatment will be avoided or minimized Outcome: Progressing   Problem: Education: Goal: Knowledge of General Education information will improve Description: Including pain rating scale, medication(s)/side effects and non-pharmacologic comfort measures Outcome: Progressing   Problem: Health Behavior/Discharge Planning: Goal: Ability to manage health-related needs will improve Outcome: Progressing   Problem: Clinical Measurements: Goal: Will remain free from infection Outcome: Progressing Goal: Diagnostic test results will improve Outcome: Progressing Goal: Respiratory complications will improve Outcome: Progressing   Problem: Activity: Goal: Risk for activity intolerance will decrease Outcome: Progressing   Problem: Nutrition: Goal: Adequate nutrition will be maintained Outcome: Progressing

## 2022-12-06 NOTE — Progress Notes (Signed)
PT Cancellation Note  Patient Details Name: Robert Carpenter MRN: [id number] DOB: 04-01-1933   Cancelled Treatment:    Reason Eval/Treat Not Completed: Patient's level of consciousness (Pt completely unresponsive to verbal, tactile, and painful stimuli. Pt briefly opened eyes but immediately fell back asleep. RN made aware. Will follow up tomorrow.)   Gladys Damme 12/06/2022, 2:56 PM

## 2022-12-06 NOTE — Plan of Care (Signed)
  Problem: Education: Goal: Knowledge of risk factors and measures for prevention of condition will improve Outcome: Progressing   Problem: Coping: Goal: Psychosocial and spiritual needs will be supported Outcome: Progressing   Problem: Respiratory: Goal: Will maintain a patent airway Outcome: Progressing Goal: Complications related to the disease process, condition or treatment will be avoided or minimized Outcome: Progressing   Problem: Education: Goal: Knowledge of General Education information will improve Description: Including pain rating scale, medication(s)/side effects and non-pharmacologic comfort measures Outcome: Progressing   Problem: Clinical Measurements: Goal: Ability to maintain clinical measurements within normal limits will improve Outcome: Progressing Goal: Will remain free from infection Outcome: Progressing Goal: Diagnostic test results will improve Outcome: Progressing Goal: Respiratory complications will improve Outcome: Progressing   Problem: Activity: Goal: Risk for activity intolerance will decrease Outcome: Progressing   Problem: Nutrition: Goal: Adequate nutrition will be maintained Outcome: Progressing   Problem: Elimination: Goal: Will not experience complications related to bowel motility Outcome: Progressing

## 2022-12-06 NOTE — Evaluation (Signed)
Clinical/Bedside Swallow Evaluation Patient Details  Name: ZARIEL BOLING MRN: [id number] Date of Birth: [birth year]  Today's Date: 12/06/2022 Time: SLP Start Time (ACUTE ONLY): 1136 SLP Stop Time (ACUTE ONLY): 1145 SLP Time Calculation (min) (ACUTE ONLY): 9 min  Past Medical History:  Past Medical History:  Diagnosis Date   Acute on chronic respiratory failure with hypoxia (HCC)    Cardiac arrest (HCC)    Cardiac arrest (HCC)    Chronic atrial fibrillation (HCC)    COVID-19 virus infection    Hypertension    Past Surgical History:  Past Surgical History:  Procedure Laterality Date   HIP SURGERY     IR REPLC GASTRO/COLONIC TUBE PERCUT W/FLUORO  03/16/2020   IR REPLC GASTRO/COLONIC TUBE PERCUT W/FLUORO  04/07/2020   HPI:  89 y.o. male, with known history of ILD, dysphagia, aspiration pneumonia, severe COVID-19 pneumonia infection in the past with reoccurrence few weeks ago requiring hospital admission on 11/24/22, was recently discharged on 12/04/2022, suspected undiagnosed early dementia, poor oral intake, failure to thrive, chronic A-fib on Eliquis, history of DVT, prior history of cardiac arrest, prior history of intubation requiring trach and PEG due to severe COVID-19 few years ago, chronic prednisone use. Patient re-admitted from SNF with dehydration and weakness, encephalopathy, mild hyperkalemia, poor pos intake, failure to thrive. Has had several MBS, 01/10/22 that showed mild residue that clears with a second swallow, no penetration or aspiration with severely exacerbated deficits noted on MBS 12/03/22 prior to most recent discharge. Per that SLP, "Pt's deficits are severely exacerbated since prior [facility] 01/2022. His intake has been poor throughout this admission and he is at risk of aspiration given any texture due to diffuse oropharyngeal residue. Discussed extensively with pt's son and MD. Recommend engaging PMT for ongoing conversations regarding GOC. Pending these decisions, recommend  continuing current diet of Dys 1 texture solids with honey thick liquids when pt is fully awake, alert, and accepting. Do not anticipate pt achieving adequate oral intake."    Assessment / Plan / Recommendation  Clinical Impression  Patient presents with inablity to take pos based on presentation today. Eyes remained closed throughout evaluation despite spontaneous movement of lower limbs and max verbal and tactile cueing. Oral cavity largely open with open mouth breathing and severely dry oral cavity with white/yellow lingual coating. Oral care provided to determine if this would increase alertness. Patient did close mouth around oral swab with eventual initiation of a sluggish appearing pharyngeal swallow upon palpation. Intermittent coughing noted which was severely weak in nature. At this time, patient is neither alert nor strong enough for pos intake. Based on SLP and MD notes from most recent admission, prognosis judged poor for ability to consume pos both safely and efficiently in order to acheive adequate oral intake. Agree with palliative care involvement. SLP will f/u for any potential for pos while decisions being made regarding POC moving forward. At this time, diet can remain in place as dysphagia 1, honey thick liquids however this would be for comfort only if alert with known risk of aspiration. SLP Visit Diagnosis: Dysphagia, oropharyngeal phase (R13.12)    Aspiration Risk  Severe aspiration risk;Risk for inadequate nutrition/hydration    Diet Recommendation Dysphagia 1 (Puree);Honey-thick liquid    Liquid Administration via: Spoon Medication Administration: Crushed with puree Supervision: Full supervision/cueing for compensatory strategies Compensations: Slow rate;Small sips/bites Postural Changes: Seated upright at 90 degrees    Other  Recommendations Oral Care Recommendations: Oral care QID Caregiver Recommendations: Avoid jello, ice cream,  thin soups, popsicles;Remove water  pitcher;Have oral suction available    Recommendations for follow up therapy are one component of a multi-disciplinary discharge planning process, led by the attending physician.  Recommendations may be updated based on patient status, additional functional criteria and insurance authorization.  Follow up Recommendations Skilled nursing-short term rehab (<3 hours/day)         Functional Status Assessment Patient has had a recent decline in their functional status and demonstrates the ability to make significant improvements in function in a reasonable and predictable amount of time.  Frequency and Duration min 2x/week  2 weeks       Prognosis Prognosis for improved oropharyngeal function: Guarded Barriers to Reach Goals: Cognitive deficits;Severity of deficits      Swallow Study   General HPI: 89 y.o. male, with known history of ILD, dysphagia, aspiration pneumonia, severe COVID-19 pneumonia infection in the past with reoccurrence few weeks ago requiring hospital admission on 11/24/22, was recently discharged on 12/04/2022, suspected undiagnosed early dementia, poor oral intake, failure to thrive, chronic A-fib on Eliquis, history of DVT, prior history of cardiac arrest, prior history of intubation requiring trach and PEG due to severe COVID-19 few years ago, chronic prednisone use. Patient re-admitted from SNF with dehydration and weakness, encephalopathy, mild hyperkalemia, poor pos intake, failure to thrive. Has had several MBS, 01/10/22 that showed mild residue that clears with a second swallow, no penetration or aspiration with severely exacerbated deficits noted on MBS 12/03/22 prior to most recent discharge. Per that SLP, "Pt's deficits are severely exacerbated since prior [facility] 01/2022. His intake has been poor throughout this admission and he is at risk of aspiration given any texture due to diffuse oropharyngeal residue. Discussed extensively with pt's son and MD. Recommend engaging PMT for  ongoing conversations regarding GOC. Pending these decisions, recommend continuing current diet of Dys 1 texture solids with honey thick liquids when pt is fully awake, alert, and accepting. Do not anticipate pt achieving adequate oral intake." Type of Study: Bedside Swallow Evaluation Previous Swallow Assessment: see HPI Diet Prior to this Study: Dysphagia 1 (pureed);Moderately thick liquids (Level 3, honey thick) Temperature Spikes Noted: No Respiratory Status: Nasal cannula History of Recent Intubation: No Behavior/Cognition: Lethargic/Drowsy;Doesn't follow directions Oral Cavity Assessment: Other (comment) (white/yellow lingual coating) Oral Care Completed by SLP: Yes Oral Cavity - Dentition: Edentulous Self-Feeding Abilities: Total assist Patient Positioning: Upright in bed Baseline Vocal Quality:  (non-verbal) Volitional Cough: Cognitively unable to elicit Volitional Swallow: Able to elicit (following oral care)    Oral/Motor/Sensory Function Overall Oral Motor/Sensory Function:  (see impression statement)   Ice Chips Ice chips: Not tested   Thin Liquid Thin Liquid: Not tested    Nectar Thick Nectar Thick Liquid: Not tested   Honey Thick Honey Thick Liquid: Not tested   Puree Puree: Not tested   Solid     Solid: Not tested     Aquita Simmering MA, CCC-SLP  Olvin Rohr Meryl 12/06/2022,11:55 AM

## 2022-12-07 ENCOUNTER — Inpatient Hospital Stay (HOSPITAL_COMMUNITY): Payer: Medicare Other

## 2022-12-07 DIAGNOSIS — I4891 Unspecified atrial fibrillation: Secondary | ICD-10-CM

## 2022-12-07 DIAGNOSIS — I5040 Unspecified combined systolic (congestive) and diastolic (congestive) heart failure: Secondary | ICD-10-CM

## 2022-12-07 DIAGNOSIS — I10 Essential (primary) hypertension: Secondary | ICD-10-CM | POA: Diagnosis not present

## 2022-12-07 DIAGNOSIS — G9341 Metabolic encephalopathy: Secondary | ICD-10-CM | POA: Diagnosis not present

## 2022-12-07 DIAGNOSIS — R627 Adult failure to thrive: Secondary | ICD-10-CM | POA: Diagnosis not present

## 2022-12-07 DIAGNOSIS — Z515 Encounter for palliative care: Secondary | ICD-10-CM | POA: Diagnosis not present

## 2022-12-07 DIAGNOSIS — Z7189 Other specified counseling: Secondary | ICD-10-CM | POA: Diagnosis not present

## 2022-12-07 DIAGNOSIS — E860 Dehydration: Secondary | ICD-10-CM | POA: Diagnosis not present

## 2022-12-07 LAB — CBC WITH DIFFERENTIAL/PLATELET
Abs Immature Granulocytes: 0.09 10*3/uL — ABNORMAL HIGH (ref 0.00–0.07)
Basophils Absolute: 0.1 10*3/uL (ref 0.0–0.1)
Basophils Relative: 0 %
Eosinophils Absolute: 0 10*3/uL (ref 0.0–0.5)
Eosinophils Relative: 0 %
HCT: 45.7 % (ref 39.0–52.0)
Hemoglobin: 15 g/dL (ref 13.0–17.0)
Immature Granulocytes: 1 %
Lymphocytes Relative: 2 %
Lymphs Abs: 0.3 10*3/uL — ABNORMAL LOW (ref 0.7–4.0)
MCH: 31.3 pg (ref 26.0–34.0)
MCHC: 32.8 g/dL (ref 30.0–36.0)
MCV: 95.2 fL (ref 80.0–100.0)
Monocytes Absolute: 0.5 10*3/uL (ref 0.1–1.0)
Monocytes Relative: 3 %
Neutro Abs: 17.3 10*3/uL — ABNORMAL HIGH (ref 1.7–7.7)
Neutrophils Relative %: 94 %
Platelets: 197 10*3/uL (ref 150–400)
RBC: 4.8 MIL/uL (ref 4.22–5.81)
RDW: 15.6 % — ABNORMAL HIGH (ref 11.5–15.5)
WBC: 18.2 10*3/uL — ABNORMAL HIGH (ref 4.0–10.5)
nRBC: 0 % (ref 0.0–0.2)

## 2022-12-07 LAB — BLOOD GAS, ARTERIAL
Acid-Base Excess: 9.6 mmol/L — ABNORMAL HIGH (ref 0.0–2.0)
Bicarbonate: 38.3 mmol/L — ABNORMAL HIGH (ref 20.0–28.0)
O2 Saturation: 68.1 %
Patient temperature: 36.5
pCO2 arterial: 69 mm[Hg] (ref 32–48)
pH, Arterial: 7.35 (ref 7.35–7.45)
pO2, Arterial: 42 mm[Hg] — ABNORMAL LOW (ref 83–108)

## 2022-12-07 LAB — PROCALCITONIN: Procalcitonin: 0.56 ng/mL

## 2022-12-07 LAB — COMPREHENSIVE METABOLIC PANEL
ALT: 32 U/L (ref 0–44)
AST: 31 U/L (ref 15–41)
Albumin: 2.8 g/dL — ABNORMAL LOW (ref 3.5–5.0)
Alkaline Phosphatase: 53 U/L (ref 38–126)
Anion gap: 13 (ref 5–15)
BUN: 29 mg/dL — ABNORMAL HIGH (ref 8–23)
CO2: 32 mmol/L (ref 22–32)
Calcium: 8.9 mg/dL (ref 8.9–10.3)
Chloride: 99 mmol/L (ref 98–111)
Creatinine, Ser: 1.49 mg/dL — ABNORMAL HIGH (ref 0.61–1.24)
GFR, Estimated: 45 mL/min — ABNORMAL LOW (ref >60)
Glucose, Bld: 142 mg/dL — ABNORMAL HIGH (ref 70–99)
Potassium: 4.1 mmol/L (ref 3.5–5.1)
Sodium: 144 mmol/L (ref 135–145)
Total Bilirubin: 1.6 mg/dL — ABNORMAL HIGH (ref <1.2)
Total Protein: 5.8 g/dL — ABNORMAL LOW (ref 6.5–8.1)

## 2022-12-07 LAB — GLUCOSE, CAPILLARY
Glucose-Capillary: 140 mg/dL — ABNORMAL HIGH (ref 70–99)
Glucose-Capillary: 104 mg/dL — ABNORMAL HIGH (ref 70–99)

## 2022-12-07 LAB — BRAIN NATRIURETIC PEPTIDE: B Natriuretic Peptide: 704.4 pg/mL — ABNORMAL HIGH (ref 0.0–100.0)

## 2022-12-07 LAB — PHOSPHORUS: Phosphorus: 3.5 mg/dL (ref 2.5–4.6)

## 2022-12-07 LAB — MAGNESIUM: Magnesium: 1.9 mg/dL (ref 1.7–2.4)

## 2022-12-07 LAB — C-REACTIVE PROTEIN: CRP: 12.3 mg/dL — ABNORMAL HIGH (ref <1.0)

## 2022-12-07 MED ORDER — IPRATROPIUM BROMIDE 0.02 % IN SOLN
0.5000 mg | Freq: Four times a day (QID) | RESPIRATORY_TRACT | Status: DC
  Administered 2022-12-07 – 2022-12-08 (×6): 0.5 mg via RESPIRATORY_TRACT
  Filled 2022-12-07 (×6): qty 2.5

## 2022-12-07 MED ORDER — METOPROLOL TARTRATE 5 MG/5ML IV SOLN
5.0000 mg | Freq: Three times a day (TID) | INTRAVENOUS | Status: DC
  Administered 2022-12-07 – 2022-12-08 (×2): 5 mg via INTRAVENOUS
  Filled 2022-12-07 (×2): qty 5

## 2022-12-07 MED ORDER — LACTATED RINGERS IV SOLN
INTRAVENOUS | Status: DC
Start: 2022-12-07 — End: 2022-12-07

## 2022-12-07 MED ORDER — ALBUMIN HUMAN 25 % IV SOLN
25.0000 g | Freq: Once | INTRAVENOUS | Status: AC
  Administered 2022-12-07: 25 g via INTRAVENOUS
  Filled 2022-12-07: qty 100

## 2022-12-07 MED ORDER — ACETAZOLAMIDE SODIUM 500 MG IJ SOLR
500.0000 mg | Freq: Once | INTRAMUSCULAR | Status: AC
  Administered 2022-12-07: 500 mg via INTRAVENOUS
  Filled 2022-12-07: qty 500

## 2022-12-07 MED ORDER — STERILE WATER FOR INJECTION IJ SOLN
INTRAMUSCULAR | Status: AC
  Administered 2022-12-07: 5 mL
  Filled 2022-12-07: qty 10

## 2022-12-07 MED ORDER — ALBUTEROL SULFATE (2.5 MG/3ML) 0.083% IN NEBU: 2.5000 mg | INHALATION_SOLUTION | RESPIRATORY_TRACT | Status: DC | PRN

## 2022-12-07 MED ORDER — SODIUM CHLORIDE 0.9 % IV SOLN
3.0000 g | Freq: Four times a day (QID) | INTRAVENOUS | Status: DC
  Administered 2022-12-07 – 2022-12-08 (×6): 3 g via INTRAVENOUS
  Filled 2022-12-07 (×6): qty 8

## 2022-12-07 MED ORDER — SODIUM CHLORIDE 0.9 % IV SOLN: INTRAVENOUS | Status: AC

## 2022-12-07 MED ORDER — FUROSEMIDE 10 MG/ML IJ SOLN
40.0000 mg | Freq: Once | INTRAMUSCULAR | Status: AC
  Administered 2022-12-07: 40 mg via INTRAVENOUS
  Filled 2022-12-07: qty 4

## 2022-12-07 MED ORDER — ACETYLCYSTEINE 20 % IN SOLN
3.0000 mL | Freq: Four times a day (QID) | RESPIRATORY_TRACT | Status: DC
  Administered 2022-12-07 – 2022-12-08 (×2): 3 mL via RESPIRATORY_TRACT
  Filled 2022-12-07 (×8): qty 4

## 2022-12-07 MED ORDER — LEVALBUTEROL HCL 0.63 MG/3ML IN NEBU
0.6300 mg | INHALATION_SOLUTION | Freq: Four times a day (QID) | RESPIRATORY_TRACT | Status: DC
  Administered 2022-12-07 – 2022-12-08 (×6): 0.63 mg via RESPIRATORY_TRACT
  Filled 2022-12-07 (×6): qty 3

## 2022-12-07 MED ORDER — HEPARIN SODIUM (PORCINE) 5000 UNIT/ML IJ SOLN: 5000.0000 [IU] | Freq: Three times a day (TID) | INTRAMUSCULAR | Status: DC

## 2022-12-07 MED ORDER — METOPROLOL TARTRATE 5 MG/5ML IV SOLN
2.5000 mg | INTRAVENOUS | Status: AC
  Administered 2022-12-07: 2.5 mg via INTRAVENOUS
  Filled 2022-12-07: qty 5

## 2022-12-07 NOTE — Progress Notes (Signed)
PT Cancellation Note  Patient Details Name: Robert Carpenter MRN: [id number] DOB: Jul 26, 1933   Cancelled Treatment:    Reason Eval/Treat Not Completed: Patient not medically ready PT orders received, chart reviewed. Per chart, pt with decline in medical status, has increased O2 needs. Nurse reports pt is not able to follow commands at this time. Will hold PT evaluation at this time & f/u as able as pt is medically appropriate.  Aleda Grana, PT, DPT 12/07/22, 11:04 AM   Sandi Mariscal 12/07/2022, 11:03 AM

## 2022-12-07 NOTE — Consult Note (Signed)
NAME:  Robert Carpenter, MRN:  [id number], DOB:  06/03/1933, LOS: 2 ADMISSION DATE:  12/05/2022, CONSULTATION DATE:  12/07/2022 REFERRING MD:  Dr Janalyn Shy, CHIEF COMPLAINT: Altered mental status, hypoxemia  History of Present Illness:  Elderly gentleman with background history of dementia with recent failure to thrive Recent hospitalization  Had rapid response called secondary to decreased saturation  History of interstitial lung disease, dysphagia, aspiration pneumonia, recently treated for COVID  Recent decline in health with failure to thrive Palliative care involved with discussions regarding goals of care Recent hospitalization at skilled nursing facility  Pertinent  Medical History   Past Medical History:  Diagnosis Date   Acute on chronic respiratory failure with hypoxia (HCC)    Cardiac arrest (HCC)    Cardiac arrest (HCC)    Chronic atrial fibrillation (HCC)    COVID-19 virus infection    Hypertension    Significant Hospital Events: Including procedures, antibiotic start and stop dates in addition to other pertinent events   12/07/2022 critical care consult  Interim History / Subjective:  Patient is somnolent  Objective   Blood pressure (!) 150/108, pulse (!) 111, temperature 97.7 F (36.5 C), temperature source Axillary, resp. rate (!) 22, height 6\' 2"  (1.88 m), weight 62.3 kg, SpO2 92%.    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% -With ongoing concerns of dehydration -Diuretics held  Chronic atrial fibrillation -Rate controlled, on Eliquis  Hypertension -Continue to monitor  Background history of interstitial lung disease, Likely aspirated with poor control of oral secretions  Overarching problem at the present time is failure to thrive Continue discussions with palliative care to assist with further goals of care discussions -Mirtazapine started for appetite stimulation, also on Megace  Hypoxemic respiratory failure -Continue oxygen supplementation -Does not require BiPAP at present -Continue respiratory toileting -Continue bronchodilator treatments -Does have some contraction alkalosis -Will give dose of Diamox 500 x 1  PCCM will follow   Best Practice (right click and "Reselect all SmartList Selections" daily)   Diet/type: NPO-with his altered mental status  DVT prophylaxis: SCDs Start: 12/05/22 1556 apixaban (ELIQUIS) tablet 5 mg    Rest per primary team  Labs   CBC: Recent Labs  Lab 11/30/22 0450 12/02/22 0620 12/03/22 0517 12/05/22 1215 12/06/22 0457  WBC 9.3 7.6 5.9 10.3 8.3  NEUTROABS  --  6.4 4.6  --  7.5  HGB 14.5 14.8 14.2 15.7 12.9*  HCT 44.0 44.2 42.6 49.4 39.5  MCV 93.6 95.3 93.4 99.2 93.4  PLT 131* 142* 147* 159 181    Basic Metabolic Panel: Recent Labs  Lab 11/30/22 0450 12/02/22 0620 12/03/22 0517 12/05/22 1215 12/05/22 1647 12/06/22 0457  NA 140 142 141 138  --  136  K 4.6 4.5 4.2 5.6* 4.0 4.0  CL 100 103 99 98  --  95*  CO2 31 26 31 23   --  33*  GLUCOSE 109* 125* 150* 91  --  204*  BUN 20 31* 30* 32*  --  32*  CREATININE 1.06 1.04 1.20 1.18  --  1.36*  CALCIUM 8.7* 8.8* 8.7* 8.7*  --  8.3*  MG 1.9 1.9 2.1  --   --  1.9  PHOS 2.4* 3.1 2.7  --   --  4.6   GFR: Estimated Creatinine Clearance: 33.7 mL/min (A) (by C-G formula based on SCr of 1.36 mg/dL (H)). Recent Labs  Lab  12/02/22 0620 12/02/22 0956 12/03/22 0517 12/05/22 1215 12/06/22 0457  PROCALCITON  --  0.10 0.10  --   --   WBC 7.6  --  5.9 10.3 8.3    Liver Function Tests: Recent Labs  Lab 12/05/22 1215  AST 40  ALT 30  ALKPHOS 47  BILITOT 1.9*  PROT 5.7*  ALBUMIN 2.8*   No results for input(s): "LIPASE", "AMYLASE" in the last 168 hours. Recent Labs  Lab 12/05/22 1647  AMMONIA 24    ABG    Component Value Date/Time   PHART 7.35 12/07/2022 0226   PCO2ART 69 (HH) 12/07/2022 0226   PO2ART 42 (L) 12/07/2022 0226   HCO3 38.3 (H) 12/07/2022 0226   TCO2 34 (H) 11/28/2022 0836   ACIDBASEDEF 2.0 11/25/2022 0254   O2SAT 68.1 12/07/2022 0226     Coagulation Profile: No results for input(s): "INR", "PROTIME" in the last 168 hours.  Cardiac Enzymes: No results for input(s): "CKTOTAL", "CKMB", "CKMBINDEX", "TROPONINI" in the last 168 hours.  HbA1C: Hgb A1c MFr Bld  Date/Time Value Ref Range Status  03/10/2020 04:09 AM 5.8 (H) 4.8 - 5.6 % Final    Comment:    (NOTE) Pre diabetes:          5.7%-6.4%  Diabetes:              >6.4%  Glycemic control for   <7.0% adults with diabetes     CBG: Recent Labs  Lab 12/05/22 2341 12/06/22 0821 12/06/22 1223 12/06/22 1624 12/07/22 0116  GLUCAP 177* 199* 175* 163* 140*    Review of Systems:   Unable to provide history  Past Medical History:  He,  has a past medical history of Acute on chronic respiratory failure with hypoxia (HCC), Cardiac arrest (HCC), Cardiac arrest (HCC), Chronic atrial fibrillation (HCC), COVID-19 virus infection, and Hypertension.   Surgical History:   Past Surgical History:  Procedure Laterality Date   HIP SURGERY     IR [facility] GASTRO/COLONIC TUBE PERCUT W/FLUORO  03/16/2020   IR [facility] GASTRO/COLONIC TUBE PERCUT W/FLUORO  04/07/2020     Social History:   reports that he quit smoking about 54 years ago. His smoking use included cigarettes. He has never used smokeless tobacco. He reports that he does not  currently use alcohol. He reports that he does not use drugs.   Family History:  His family history is not on file.   Allergies No Known Allergies   Home Medications  Prior to Admission medications   Medication Sig Start Date End Date Taking? Authorizing Provider  acetaminophen (TYLENOL) 325 MG tablet Take 650 mg by mouth every 6 (six) hours as needed for headache, fever or mild pain (pain score 1-3).   Yes [provider]  amantadine (SYMMETREL) 100 MG capsule Take 100 mg by mouth daily.   Yes [provider]  apixaban (ELIQUIS) 5 MG TABS tablet Take 1 tablet (5 mg total) by mouth 2 (two) times daily. 12/04/22  Yes Leroy Sea, MD  BREO ELLIPTA [phone] MCG/ACT AEPB  Inhale 1 puff into the lungs daily. 11/06/22  Yes [provider]  ipratropium-albuterol (DUONEB) 0.5-2.5 (3) MG/3ML SOLN Take 3 mLs by nebulization in the morning, at noon, and at bedtime. 11/06/22  Yes [provider]  metoprolol tartrate (LOPRESSOR) 100 MG tablet Take 1 tablet (100 mg total) by mouth 2 (two) times daily. 12/04/22  Yes Leroy Sea, MD  mirtazapine (REMERON) 7.5 MG tablet Take 7.5 mg by mouth at bedtime.   Yes [provider]  Multiple Vitamin (MULTIVITAMIN WITH MINERALS) TABS tablet Take 1 tablet by mouth daily.   Yes [provider]  predniSONE (DELTASONE) 10 MG tablet Take 10 mg by mouth daily. 11/06/22  Yes [provider]  QUEtiapine (SEROQUEL) 25 MG tablet Take 1 tablet (25 mg total) by mouth 2 (two) times daily. 12/04/22  Yes Leroy Sea, MD  sennosides-docusate sodium (SENOKOT-S) 8.6-50 MG tablet Take 2 tablets by mouth in the morning and at bedtime.   Yes [provider]  sertraline (ZOLOFT) 50 MG tablet Take 50 mg by mouth daily.   Yes [provider]  tamsulosin (FLOMAX) 0.4 MG CAPS capsule Take 1 capsule (0.4 mg total) by mouth daily. 12/04/22  Yes Leroy Sea, MD  traZODone (DESYREL) 50 MG tablet Take 50  mg by mouth at bedtime. 11/07/22  Yes [provider]  VENTOLIN HFA 108 (90 Base) MCG/ACT inhaler Inhale 1 puff into the lungs every 6 (six) hours as needed for wheezing or shortness of breath. 08/24/22  Yes [provider]  pantoprazole (PROTONIX) 40 MG tablet Take 1 tablet (40 mg total) by mouth daily. Patient not taking: Reported on 12/05/2022 12/04/22   Leroy Sea, MD    Virl Diamond, MD [place] [facility] Pager: See Loretha Stapler

## 2022-12-07 NOTE — Progress Notes (Signed)
Assessment complete.  Patient family at bedside.  Patient resting with eyes closed and breathing 20 times a minute.  Lungs sounds clear on the right and left with rhonchi.  Venturi mask at 14 L noted with Oxygen sats of 91-92%.  Mittens noted on bilateral hands.  All AM po meds held and IV and oral care given as ordered.  Patient responds to verbal stimuli.  Pupil even and reactive brisk 3 to 2.  All PPP.  Feet are cold and will place an extra blanket.  Questions answered from family.  Verbalized understanding.  Will continue to monitor.

## 2022-12-07 NOTE — Progress Notes (Signed)
OT Cancellation Note  Patient Details Name: Robert Carpenter MRN: [id number] DOB: 1933/10/18   Cancelled Treatment:    Reason Eval/Treat Not Completed: Patient not medically ready (Patient not medically ready PT orders received, chart reviewed. Per chart, pt with decline in medical status, has increased O2 needs. Nurse reports pt is not able to follow commands at this time.) OT will follow-up with pt as able.   12/07/2022  AB, OTR/L  Acute Rehabilitation Services  Office: [phone]  Tristan Schroeder 12/07/2022, 2:27 PM

## 2022-12-07 NOTE — Progress Notes (Signed)
Palliative:  HPI: 89 y.o. male  with past medical history of ILD, dysphagia, CHF, cardiac arrest, suspect undiagnosed dementia was admitted on 12/05/2022 with poor oral intake/failure to thrive/severe protein calorie malnutrition/chachexia/acute on chronic oropharyngeal dysphagia, dehydration with hyperkalemia, and acute metabolic encephalopathy superimposed on likely dementia.   Reviewed chart. Noted prolonged hospital stay resulting in trach/PEG as a result of COVID in December 2021. Noted previous cardiac arrest after patient removed trach per notes. Reviewed conservations already had today with hospitalist and PCCM - family have requested full code/full scope.   I met today at Robert Carpenter' bedside along with his sister. Sister shares that son and daughter have left but son will be returning later today. Discussed with sister who describes her brother's decline and noting his weight and muscle loss. She shares that he has a wonderful sense of humor. She reports that he has never been able to sit still often having multiple jobs and enjoyed golf and bowling. She shares that he walked her down the aisle for her wedding 5 years ago. She acknowledges his decline over the past few years and seems more recently.   I returned later in the day and sister still at bedside resting. I called son, Robert Hua, but no answer. I did not leave voicemail as I am leaving for the day shortly. Will ask my colleague to attempt follow up again tomorrow.   All questions/concerns addressed. Emotional support provided.   Exam: Mostly unresponsive. Some non-purposeful movements but no verbal response. Cachectic - muscle wasting. Breathing unlabored on oxygen. Poor reserve. No edema. Appears to be at end of life.   Plan: - Needs further conversation  25 min  Robert Channel, NP Palliative Medicine Team Pager [phone] (Please see [email or web address] for schedule) Team Phone [phone]    Greater than 50%  of this time was spent  counseling and coordinating care related to the above assessment and plan

## 2022-12-07 NOTE — Progress Notes (Signed)
SLP Cancellation Note  Patient Details Name: Robert Carpenter MRN: [id number] DOB: [birth year]   Cancelled treatment:       Reason Eval/Treat Not Completed: Medical issues which prohibited therapy. Patient now requiring venti-mask for O2. Per RN, only minimally responsive to sternal rub. Will continue efforts.  Ferdinand Lango MA, CCC-SLP    [name] 12/07/2022, 9:45 AM

## 2022-12-07 NOTE — Significant Event (Signed)
Rapid Response Event Note   Reason for Call :  Decreased mental status.  Pt seen last night by RRT as well. Last night pt would move all extremities spontaneously and to painful stimulation, would not speak or follow commands, would chew on suction device when placed in his mouth but had no gag reflex to deep oral suctioning and nastracheal suctioning.   Initial Focused Assessment:  Pt lying in bed with eyes closed. His breathing is labored. He moves upper extremities weakly and non purposefully. He will not move to painful stimulation, speak, or follow commands. He will close his mouth around suction device but has no gag reflex. His lungs are rhonchus t/o. Skin warm to touch, dry.  T-98, HR-120s, BP-116/73, RR-30, SpO2-90-94% on .55 VM  Interventions:  Metoprolol 2.5mg  IV ordered PTA RRT  Plan of Care:  PCCM MD to discuss patient's plan of care and code status with family. Await conclusion of discussion. Continue to monitor. Please call RRT if further assistance needed.   Event Summary:   MD Notified: Dr. Janalyn Shy notified, PCCM notified.  Call Time:2029 Arrival Time:2034 End [id number]  Terrilyn Saver, RN

## 2022-12-07 NOTE — Progress Notes (Signed)
NAME:  Robert Carpenter, MRN:  [id number], DOB:  April 05, 1933, LOS: 2 ADMISSION DATE:  12/05/2022, CONSULTATION DATE:  12/07/2022 REFERRING MD:  Dr Janalyn Shy, CHIEF COMPLAINT: Altered mental status, hypoxemia  History of Present Illness:  Elderly gentleman with background history of dementia with recent failure to thrive Recent hospitalization  Had rapid response called secondary to decreased saturation  History of interstitial lung disease, dysphagia, aspiration pneumonia, recently treated for COVID  Recent decline in health with failure to thrive Palliative care involved with discussions regarding goals of care Recent hospitalization at skilled nursing facility  Pertinent  Medical History   Past Medical History:  Diagnosis Date   Acute on chronic respiratory failure with hypoxia (HCC)    Cardiac arrest (HCC)    Cardiac arrest (HCC)    Chronic atrial fibrillation (HCC)    COVID-19 virus infection    Hypertension    Significant Hospital Events: Including procedures, antibiotic start and stop dates in addition to other pertinent events   12/07/2022 critical care consult  Interim History / Subjective:  Appears to be actively dying  Objective   Blood pressure (!) 120/90, pulse (!) 106, temperature 98.7 F (37.1 C), temperature source Axillary, resp. rate (!) 28, height 6\' 2"  (1.88 m), weight 62.3 kg, SpO2 94%.    FiO2 (%):  [40 %-55 %] 55 %   Intake/Output Summary (Last 24 hours) at 12/07/2022 1001 Last data filed at 12/07/2022 0810 Gross per 24 hour  Intake --  Output 200 ml  Net -200 ml   Filed Weights   12/05/22 1714  Weight: 62.3 kg    Examination: Elderly frail male who is cachectic Currently on full facemask to With poor air movement Heart sounds are distant Abdomen is soft Extremities are rigid and without edema He appears to be actively dying  Resolved Hospital Problem list     Assessment & Plan:  Metabolic encephalopathy superimposed on dementia Moot  point at this time  Chronic combined systolic and diastolic heart failure with ejection fraction of 45% Appears to be dehydrated Diuretics are on hold  Chronic atrial fibrillation Rate controlled  Hypertension Monitor  12/07/2022 Dr. Isaiah Serge Dr. Thedore Mins spoke with the family at length concerning CODE STATUS.  The family is adamant about continuing current goals of care.  We will try using medical interventions and have further discussions about CODE STATUS should he fail   Best Practice (right click and "Reselect all SmartList Selections" daily)   Diet/type: NPO-with his altered mental status  DVT prophylaxis: SCDs Start: 12/05/22 1556 apixaban (ELIQUIS) tablet 5 mg    Rest per primary team  Labs   CBC: Recent Labs  Lab 12/02/22 0620 12/03/22 0517 12/05/22 1215 12/06/22 0457 12/07/22 0322  WBC 7.6 5.9 10.3 8.3 18.2*  NEUTROABS 6.4 4.6  --  7.5 17.3*  HGB 14.8 14.2 15.7 12.9* 15.0  HCT 44.2 42.6 49.4 39.5 45.7  MCV 95.3 93.4 99.2 93.4 95.2  PLT 142* 147* 159 181 197    Basic Metabolic Panel: Recent Labs  Lab 12/02/22 0620 12/03/22 0517 12/05/22 1215 12/05/22 1647 12/06/22 0457 12/07/22 0322  NA 142 141 138  --  136 144  K 4.5 4.2 5.6* 4.0 4.0 4.1  CL 103 99 98  --  95* 99  CO2 26 31 23   --  33* 32  GLUCOSE 125* 150* 91  --  204* 142*  BUN 31* 30* 32*  --  32* 29*  CREATININE 1.04 1.20 1.18  --  1.36*  1.49*  CALCIUM 8.8* 8.7* 8.7*  --  8.3* 8.9  MG 1.9 2.1  --   --  1.9 1.9  PHOS 3.1 2.7  --   --  4.6 3.5   GFR: Estimated Creatinine Clearance: 30.8 mL/min (A) (by C-G formula based on SCr of 1.49 mg/dL (H)). Recent Labs  Lab 12/02/22 0956 12/03/22 0517 12/05/22 1215 12/06/22 0457 12/07/22 0322  PROCALCITON 0.10 0.10  --   --  0.56  WBC  --  5.9 10.3 8.3 18.2*    Liver Function Tests: Recent Labs  Lab 12/05/22 1215 12/07/22 0322  AST 40 31  ALT 30 32  ALKPHOS 47 53  BILITOT 1.9* 1.6*  PROT 5.7* 5.8*  ALBUMIN 2.8* 2.8*   No results for  input(s): "LIPASE", "AMYLASE" in the last 168 hours. Recent Labs  Lab 12/05/22 1647  AMMONIA 24    ABG    Component Value Date/Time   PHART 7.35 12/07/2022 0226   PCO2ART 69 (HH) 12/07/2022 0226   PO2ART 42 (L) 12/07/2022 0226   HCO3 38.3 (H) 12/07/2022 0226   TCO2 34 (H) 11/28/2022 0836   ACIDBASEDEF 2.0 11/25/2022 0254   O2SAT 68.1 12/07/2022 0226     Coagulation Profile: No results for input(s): "INR", "PROTIME" in the last 168 hours.  Cardiac Enzymes: No results for input(s): "CKTOTAL", "CKMB", "CKMBINDEX", "TROPONINI" in the last 168 hours.  HbA1C: Hgb A1c MFr Bld  Date/Time Value Ref Range Status  03/10/2020 04:09 AM 5.8 (H) 4.8 - 5.6 % Final    Comment:    (NOTE) Pre diabetes:          5.7%-6.4%  Diabetes:              >6.4%  Glycemic control for   <7.0% adults with diabetes     CBG: Recent Labs  Lab 12/05/22 2341 12/06/22 0821 12/06/22 1223 12/06/22 1624 12/07/22 0116  GLUCAP 177* 199* 175* 163* 140*    Steve Aaron Bostwick ACNP Acute Care Nurse Practitioner [facility]/Critical [facility] Please consult Amion 12/07/2022, 10:02 AM

## 2022-12-07 NOTE — Progress Notes (Signed)
Cross covering ICU physician  Called by RN about pt's decreased mentation and inability to protect his airway. Pt is designated full code but after discussion with pt's son he has stated he does not want CPR but for pt to pass with "respect and dignity" if it came to that. 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 I attempted to reassure him we were by no means trying to transition to comfort care. I expressed that we would continue with all measures of care for the pt that would align with the patient's and pt's family wishes for hopeful recovery. Son states that he understood and agrees that we should continue with current care at this time but states that he would like to transition to DNR/DNI, however he also requested I discuss this with his sister 70.   I personally placed a call to [facility] and discussed the above with her. She has requested a moment to discuss with her family and will call back. I will await her call.   RN updated via message.   Update 0017:  45 min conversation with daughter via phone relatively circling in conversation. She has repeatedly stated to follow what her brother has recommended with DNR/DNI and then states she wants him intubated and CPR but doesn't want [place] to break his ribs. I discussed with her that if she wants him to remain full code he will need to move to ICU for airway protection. She then is a bit hesitant stating not do cpr but also don't intubate, yet move him to the icu.   At this time she has now requested I call her brother back and then also give them a day to decide what to do as they have never had discussions with their father about his wishes. I expressed that we may not have that long and if he remains a full code that of course in emergent situation we  will focus on the pt first which would mean all measures to achieve ROSC.  I expressed my understanding the difficulty and discomfort in navigating these decisions on short notice yet also want to ensure we are honoring the patients and family's wishes as well as circumventing a potentially life threatening emergency.   RN updated via secure chat.

## 2022-12-07 NOTE — Progress Notes (Signed)
PROGRESS NOTE                                                                                                                                                                                                             Patient Demographics:    Robert Carpenter, is a 89 y.o. male, DOB - Sep 01, 1933, VFI:[id number]  Outpatient Primary MD for the patient is Robert Alken, MD    LOS - 2  Admit date - 12/05/2022    Chief Complaint  Patient presents with   Weakness       Brief Narrative (HPI from H&P)   89 y.o. male, with known history of ILD, dysphagia, aspiration pneumonia, severe COVID-19 pneumonia infection in the past with reoccurrence few weeks ago requiring hospital admission was recently discharged on 12/04/2022, suspected undiagnosed early dementia, poor oral intake, failure to thrive, chronic A-fib on Eliquis, history of DVT, prior history of cardiac arrest, prior history of intubation requiring trach and PEG due to severe COVID-19 few years ago, chronic prednisone use.   Patient with above history was admitted to this hospital on 11/24/2022 from SNF with decreased mental status, decreased p.o. intake, failure to thrive, dehydration, he does have the symptoms at baseline but symptoms got considerably worse, he was diagnosed with another bout of COVID-19 infection with acute metabolic encephalopathy, poor oral intake severe dehydration and admitted to the hospital.  With supportive care he showed some improvement however his oral intake was very poor and he frequently required IV D50 to augment his CBGs.  We had long discussion with patient's son prior to his discharge on 12/04/2022 that his father's oral intake is very poor and that it looks like he is nearing his and, we encouraged him to think about comfort measures, he said he is quite religious and he will discuss this with his sister.   Patient subsequently went to SNF  however he did not eat or drink at all since he was at [facility] and he was sent to the hospital for dehydration and generalized weakness.  He is currently awake however he has encephalopathy and will not answer questions, appears to be in no distress.  Lab work consistent with dehydration, mild hyperkalemia, I was requested to admit him for poor oral intake, failure to thrive, metabolic encephalopathy, dehydration and hyperkalemia.   Subjective:  Robert Carpenter today in bed quite confused, unable to provide any review of systems.   Assessment  & Plan :    Poor oral intake, failure to thrive, severe protein calorie malnutrition, cachexia, chronic history of poor oral intake, oropharyngeal dysphagia acute on chronic, dehydration, AKI.   Patient has had gradually progressive dysphagia and failure to thrive along with poor oral intake which has been ongoing for several months, he recently had COVID-19 infection few weeks ago which made the symptoms worse, he was discharged to SNF on 12/04/2022 and son was updated on patient's extremely poor oral intake and the likelihood of him not doing well, he was encouraged to think about comfort measures however he wanted to get some more time to think.   Unfortunately as expected patient did not eat or drink well at SNF and is back within a day, we will consult palliative care for goals of care, I have called the son again already however he still wants more time to think about future course of action for now he wants everything done and wants his dad to be full code.  Will commence with IV fluids with D5, continue Remeron and add Megace, he is on chronic 10 mg of oral prednisone for his underlying lung issues I will switch it to IV steroids which may augment his appetite, speech therapy will be reinvolved for his underlying dysphagia and he will be kept on soft diet as tolerated with feeding assistance and aspiration precautions.   His long-term prognosis again looks  quite poor and looks like he is gradually declining due to his advanced age and most likely underlying early dementia.    Aspiration pneumonia night of 12/06/2022.  Unasyn, he is aspirating his oral secretions, again prognosis is very grim.  Explained family in detail on 12/07/2022, PCCM also assisted in the conversation and meeting.  Family still wants to hold off on comfort measures.  Will place on Unasyn and monitor.    Dehydration, AKI with hyperkalemia.  All likely due to extremely poor oral intake with severe protein calorie malnutrition.  He is now third spacing fluid, patient's daughter wants a trial of IV albumin and Lasix, clearly told that Lasix at times can help but at times can cause worsening of renal function as well, however she said she is requesting for trial of Lasix to see how the urine output and renal function does after it.  Will order a dose of IV albumin and Lasix, renal ultrasound is nonacute, no urinary retention on bladder scans.  Avoid nephrotoxins and continue to monitor.   Acute metabolic encephalopathy superimposed on dementia:  Etiology multifactorial likely due to oral intake on top of his underlying dementia, low-dose Seroquel, minimize benzodiazepines and narcotics, negative head CT, stable TSH and ammonia, UA does not show any UTI, continue to monitor bladder scans to rule out any retention.   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  [place] vas 2 score of greater than 3. Rate controlled continue beta-blocker and Eliquis.  As needed IV Cardizem.   Essential hypertension: As he is dehydrated will drop his home beta-blocker dose, as needed IV Cardizem and hydralazine on board.   Mild thrombocytopenia: Likely due to COVID-19 infection.  Monitor intermittently.   Recent acute  urinary retention: Continue Flomax, monitor bladder scans, required Foley  last admission.  Monitor.   Oropharyngeal dysphagia: Speech evaluated the patient now on a dysphagia 1 diet.  Dry oral mucosa with trach oral secretions coating his tongue, oral care per RN, nystatin swish and swallow although chances of him having fungal infection seem very less on exam.   Severe protein calorie malnutrition.  Protein supplements if tolerated oral diet, IV albumin upon family request on 12/07/2022.    History of severe COVID-19 infection in 2021 with prolonged vent requirement trach and PEG (decannulated and PEG removed)/now with pulmonary fibrosis:  Noted has been weaned to room air.  Oral steroids switched to IV due to poor oral intake, chest x-ray nonacute currently on room air.  Continue to monitor.         Condition - Extremely Guarded  Family Communication  :   son [name] [phone]  detail on 12/05/2022, son [name] in detail along with patient's daughter over the phone on 12/07/2022 in the presence of critical care team, clearly explained that patient is actively dying.  They want more time to think about it.  Code Status :  Full  Consults  : Palliative care, PCCM  PUD Prophylaxis : PPI   Procedures  :     CT head - non acute      Disposition Plan  :    Status is: Inpatient   DVT Prophylaxis  :    Place and maintain sequential compression device Start: 12/07/22 1105 SCDs Start: 12/05/22 1556 apixaban (ELIQUIS) tablet 5 mg     Lab Results  Component Value Date   PLT 197 12/07/2022    Diet :  Diet Order             DIET - DYS 1 Room service appropriate? Yes; Fluid consistency: Honey Thick  Diet effective now                    Inpatient Medications  Scheduled Meds:  (feeding supplement) PROSource Plus  30 mL Oral BID BM   acetylcysteine  3 mL Nebulization Q6H   amantadine  100 mg Oral Daily   apixaban  5 mg Oral BID   furosemide  40 mg Intravenous Once    ipratropium  0.5 mg Nebulization Q6H   levalbuterol  0.63 mg Nebulization Q6H   megestrol  40 mg Oral Daily   methylPREDNISolone (SOLU-MEDROL) injection  30 mg Intravenous Q24H   metoprolol tartrate  25 mg Oral BID   mirtazapine  7.5 mg Oral QHS   nystatin  5 mL Oral QID   pantoprazole (PROTONIX) IV  40 mg Intravenous Q24H   QUEtiapine  25 mg Oral BID   tamsulosin  0.4 mg Oral Daily   Continuous Infusions:  sodium chloride     ampicillin-sulbactam (UNASYN) IV 3 g (12/07/22 0720)   PRN Meds:.acetaminophen, diltiazem, haloperidol lactate, hydrALAZINE, [DISCONTINUED] ondansetron **OR** ondansetron (ZOFRAN) IV, senna-docusate  Antibiotics  :    Anti-infectives (From admission, onward)    Start     Dose/Rate Route Frequency Ordered Stop   12/07/22 0630  Ampicillin-Sulbactam (UNASYN) 3 g in sodium chloride 0.9 % 100 mL IVPB        3 g 200 mL/hr over 30 Minutes Intravenous Every 6 hours 12/07/22 0615           Objective:   Vitals:   12/07/22 0359 12/07/22 0545 12/07/22 0841 12/07/22 0917  BP: 120/80 132/88 (!) 120/90   Pulse: (!) 114 (!) 114 (!) 106   Resp: Marland Kitchen)  30 (!) 30 (!) 28   Temp: 97.8 F (36.6 C) 97.7 F (36.5 C) 98.7 F (37.1 C)   TempSrc: Axillary Axillary Axillary   SpO2: 95% 100% 96% 94%  Weight:      Height:        Wt Readings from Last 3 Encounters:  12/05/22 62.3 kg  12/04/22 62.3 kg  07/18/21 78.6 kg     Intake/Output Summary (Last 24 hours) at 12/07/2022 1110 Last data filed at 12/07/2022 0810 Gross per 24 hour  Intake --  Output 200 ml  Net -200 ml     Physical Exam  Elderly, frail, cachectic African-American male, awake but confused,, No new F.N deficits, oral mucosa, thick dry oral secretions coating the tongue [place].AT,PERRAL Supple Neck, No JVD,   Symmetrical Chest wall movement, Good air movement bilaterally, coarse bibasilar breath sounds right worse than left RRR,No Gallops,Rubs or new Murmurs,  +ve B.Sounds, Abd Soft, No tenderness,    No Cyanosis, Clubbing or edema        Data Review:    Recent Labs  Lab 12/02/22 0620 12/03/22 0517 12/05/22 1215 12/06/22 0457 12/07/22 0322  WBC 7.6 5.9 10.3 8.3 18.2*  HGB 14.8 14.2 15.7 12.9* 15.0  HCT 44.2 42.6 49.4 39.5 45.7  PLT 142* 147* 159 181 197  MCV 95.3 93.4 99.2 93.4 95.2  MCH 31.9 31.1 31.5 30.5 31.3  MCHC 33.5 33.3 31.8 32.7 32.8  RDW 15.3 15.0 14.9 14.8 15.6*  LYMPHSABS 0.8 0.8  --  0.4* 0.3*  MONOABS 0.4 0.4  --  0.4 0.5  EOSABS 0.0 0.0  --  0.0 0.0  BASOSABS 0.0 0.0  --  0.0 0.1    Recent Labs  Lab 12/02/22 0620 12/02/22 0956 12/03/22 0517 12/05/22 1215 12/05/22 1647 12/06/22 0457 12/07/22 0322 12/07/22 0324  NA 142  --  141 138  --  136 144  --   K 4.5  --  4.2 5.6* 4.0 4.0 4.1  --   CL 103  --  99 98  --  95* 99  --   CO2 26  --  31 23  --  33* 32  --   ANIONGAP 13  --  11 17*  --  8 13  --   GLUCOSE 125*  --  150* 91  --  204* 142*  --   BUN 31*  --  30* 32*  --  32* 29*  --   CREATININE 1.04  --  1.20 1.18  --  1.36* 1.49*  --   AST  --   --   --  40  --   --  31  --   ALT  --   --   --  30  --   --  32  --   ALKPHOS  --   --   --  47  --   --  53  --   BILITOT  --   --   --  1.9*  --   --  1.6*  --   ALBUMIN  --   --   --  2.8*  --   --  2.8*  --   CRP 6.7*  --  4.6*  --  2.8* 2.3* 12.3*  --   PROCALCITON  --  0.10 0.10  --   --   --  0.56  --   TSH  --   --   --   --  3.046  --   --   --  AMMONIA  --   --   --   --  24  --   --   --   BNP 939.5*  --  1,115.5*  --   --  1,052.7*  --  704.4*  MG 1.9  --  2.1  --   --  1.9 1.9  --   CALCIUM 8.8*  --  8.7* 8.7*  --  8.3* 8.9  --       Recent Labs  Lab 12/02/22 0620 12/02/22 0956 12/03/22 0517 12/05/22 1215 12/05/22 1647 12/06/22 0457 12/07/22 0322 12/07/22 0324  CRP 6.7*  --  4.6*  --  2.8* 2.3* 12.3*  --   PROCALCITON  --  0.10 0.10  --   --   --  0.56  --   TSH  --   --   --   --  3.046  --   --   --   AMMONIA  --   --   --   --  24  --   --   --   BNP 939.5*  --   1,115.5*  --   --  1,052.7*  --  704.4*  MG 1.9  --  2.1  --   --  1.9 1.9  --   CALCIUM 8.8*  --  8.7* 8.7*  --  8.3* 8.9  --     --------------------------------------------------------------------------------------------------------------- No results found for: "CHOL", "HDL", "LDLCALC", "LDLDIRECT", "TRIG", "CHOLHDL"  Lab Results  Component Value Date   HGBA1C 5.8 (H) 03/10/2020   Recent Labs    12/05/22 1647  TSH 3.046   No results for input(s): "VITAMINB12", "FOLATE", "FERRITIN", "TIBC", "IRON", "RETICCTPCT" in the last 72 hours. ------------------------------------------------------------------------------------------------------------------ Cardiac Enzymes No results for input(s): "CKMB", "TROPONINI", "MYOGLOBIN" in the last 168 hours.  Invalid input(s): "CK"  Micro Results No results found for this or any previous visit (from the past 240 hour(s)).  Radiology Reports DG Chest Port 1 View  Result Date: 12/07/2022 CLINICAL DATA:  Acute respiratory disease.  Shortness of breath. EXAM: PORTABLE CHEST 1 VIEW COMPARISON:  12/05/2022 FINDINGS: Shallow inspiration. Cardiac enlargement. Linear fibrosis or scarring in the lung bases. More prominent infiltration in the right perihilar region may represent superimposed asymmetric edema or consolidation. Probable right pleural effusion. No pneumothorax. Calcified and tortuous aorta. Degenerative changes in the spine and shoulders. IMPRESSION: Cardiac enlargement. Chronic scarring in the lungs. Suggestion of developing perihilar infiltrate on the right. Small right pleural effusion. Electronically Signed   By: Burman Nieves M.D.   On: 12/07/2022 03:03   US RENAL  Result Date: 12/06/2022 CLINICAL DATA:  Acute renal insufficiency. EXAM: RENAL / URINARY TRACT ULTRASOUND COMPLETE COMPARISON:  None Available. FINDINGS: Evaluation is limited due to patient's positioning. Right Kidney: Renal measurements: 7.5 x 3.3 x 3.9 cm = volume: 50 mL.  There is diffuse increased renal parenchymal echogenicity. No hydronephrosis or shadowing stone. Left Kidney: Renal measurements: 8.4 x 4.5 x 3.4 cm = volume: 67 mL. Diffuse increased renal parenchymal echogenicity. No hydronephrosis or shadowing stone. Bladder: Appears normal for degree of bladder distention. Other: A right sided pleural effusion is partially visualized. IMPRESSION: 1. Echogenic kidneys may represent medical renal disease. No hydronephrosis or shadowing stone. 2. Right-sided pleural effusion. Electronically Signed   By: Elgie Collard M.D.   On: 12/06/2022 23:11   CT HEAD WO CONTRAST ( )  Result Date: 12/05/2022 CLINICAL DATA:  Altered mental status EXAM: CT HEAD WITHOUT CONTRAST TECHNIQUE: Contiguous axial images were obtained from  the base of the skull through the vertex without intravenous contrast. RADIATION DOSE REDUCTION: This exam was performed according to the departmental dose-optimization program which includes automated exposure control, adjustment of the mA and/or kV according to patient size and/or use of iterative reconstruction technique. COMPARISON:  11/24/2022 FINDINGS: Brain: No evidence of acute infarction, hemorrhage, mass, mass effect, or midline shift. No hydrocephalus or extra-axial fluid collection. Periventricular white matter changes, likely the sequela of chronic small vessel ischemic disease. Age related cerebral atrophy. Vascular: No hyperdense vessel. Skull: Negative for fracture or focal lesion. Sinuses/Orbits: No acute finding. Other: The mastoid air cells are well aerated. Redemonstrated metallic fragments in the scalp. Incidental note is made of high density material lining the patient's tongue and palate, which is new from the prior exam IMPRESSION: 1. No acute intracranial process. 2. Incidental note is made of high density material lining the patient's tongue and palate, which is new from the prior exam. Correlate with physical exam. Electronically  Signed   By: Wiliam Ke M.D.   On: 12/05/2022 18:25   DG Chest Port 1 View  Result Date: 12/05/2022 CLINICAL DATA:  Weakness. EXAM: PORTABLE CHEST 1 VIEW COMPARISON:  12/02/2022. FINDINGS: Low lung volume. Redemonstration of diffuse chronic interstitial markings throughout bilateral lungs with relative sparing of the left upper and mid lung zones, essentially similar to the prior study. Findings may represent underlying lung fibrosis/scarring. No acute consolidation or lung collapse seen. There is probable small layering right pleural effusion. No significant left pleural effusion. No pneumothorax on either side. Stable cardio-mediastinal silhouette. No acute osseous abnormalities. The soft tissues are within normal limits. IMPRESSION: *Essentially stable exam. Redemonstration of diffuse chronic interstitial markings throughout bilateral lungs with relative sparing of the left upper and mid lung zones. *No acute consolidation or lung collapse. Electronically Signed   By: Jules Schick M.D.   On: 12/05/2022 14:00   DG Swallowing Func-Speech Pathology  Result Date: 12/03/2022 Table formatting from the original result was not included. Modified Barium Swallow Study Patient Details Name: CHARLY PARTIDA MRN: [id number] Date of Birth: [birth year] Today's Date: 12/03/2022 HPI/PMH: HPI: 89 year old man who is critically ill due to acute hypoxic and hypercarbic respiratory failure requiring NIV due to COVID pneumonia. He is SNF-bound at baseline, HFrEF and chronic Afib. He has baseline disorientation but is normally communicative. Pt has a history of cardiac arrest, trach and PEG to recover, now removed. Has has several MBS, the last on 01/10/22 that showed mild residue that clears with a second swallow, no penetration or aspiration. He has had recurrent aspiration pna however. Recommended to consume Mech soft/thin liquids due to impairment in mastication. Clinical Impression: Clinical Impression: Pt presents with a  significant oropharyngeal dysphagia characterized by impaired strength, timing, and control. Pt's cognition further impacts structural deficits resulting in posterior spillage of boluses prior to swallow initiation. This, in addition to incomplete anterior hyoid burst and subsequent deficient laryngeal vestibule closure, results in silent aspiration (PAS 8) of thin liquids and eventual sensed aspiration of nectar thick liquids (PAS 7). Limited trials of honey thick liquids and purees are penetrated above the vocal folds and only intermittently cleared. Pt has substantial residue that is not effectively cleared by unsuccessful attempts at subswallows given pt's base of tongue weakness, absent epiglottic inversion, and decreased pharyngeal stripping wave. Pt's velopharyngeal closure is incomplete, allowing boluses to consistently enter his nasal cavity. He was unable to follow commands to swallow, cough, or perform any additional compensatory strategies. Pt's  deficits are severely exacerbated since prior [facility] 01/2022. His intake has been poor throughout this admission and he is at risk of aspiration given any texture due to diffuse oropharyngeal residue. Discussed extensively with pt's son and MD. Recommend engaging PMT for ongoing conversations regarding GOC. Pending these decisions, recommend continuing current diet of Dys 1 texture solids with honey thick liquids when pt is fully awake, alert, and accepting. Do not anticipate pt achieving adequate oral intake. Will continue to follow. Factors that may increase risk of adverse event in presence of aspiration Rubye Oaks & Clearance Coots 2021): Factors that may increase risk of adverse event in presence of aspiration Rubye Oaks & Clearance Coots 2021): Poor general health and/or compromised immunity; Reduced cognitive function; Limited mobility; Frail or deconditioned; Dependence for feeding and/or oral hygiene; Inadequate oral hygiene; Weak cough; Aspiration of thick, dense, and/or acidic  materials Recommendations/Plan: Swallowing Evaluation Recommendations Swallowing Evaluation Recommendations Recommendations: PO diet PO Diet Recommendation: Dysphagia 1 (Pureed); Moderately thick liquids (Level 3, honey thick) Liquid Administration via: Cup; Straw; Spoon Medication Administration: Crushed with puree Supervision: Full assist for feeding; Full supervision/cueing for swallowing strategies Swallowing strategies  : Minimize environmental distractions; Slow rate; Small bites/sips; Check for pocketing or oral holding Postural changes: Position pt fully upright for meals; Stay upright 30-60 min after meals Oral care recommendations: Oral care QID (4x/day); Staff/trained caregiver to provide oral care; Use suctioning for oral care Recommended consults: Consider Palliative care Caregiver Recommendations: Avoid jello, ice cream, thin soups, popsicles; Remove water pitcher; Have oral suction available Treatment Plan Treatment Plan Treatment recommendations: Therapy as outlined in treatment plan below Follow-up recommendations: Skilled nursing-short term rehab (<3 hours/day) Functional status assessment: Patient has had a recent decline in their functional status and/or demonstrates limited ability to make significant improvements in function in a reasonable and predictable amount of time. Treatment frequency: Min 2x/week Treatment duration: 1 week Interventions: Aspiration precaution training; Compensatory techniques; Patient/family education; Trials of upgraded texture/liquids; Diet toleration management by SLP Recommendations Recommendations for follow up therapy are one component of a multi-disciplinary discharge planning process, led by the attending physician.  Recommendations may be updated based on patient status, additional functional criteria and insurance authorization. Assessment: Orofacial Exam: Orofacial Exam Oral Cavity: Oral Hygiene: Dried secretions; Lingual coating Oral Cavity - Dentition:  Edentulous Orofacial Anatomy: WFL Oral Motor/Sensory Function: WFL Anatomy: Anatomy: Suspected cervical osteophytes Boluses Administered: Boluses Administered Boluses Administered: Thin liquids (Level 0); Mildly thick liquids (Level 2, nectar thick); Moderately thick liquids (Level 3, honey thick); Puree  Oral Impairment Domain: Oral Impairment Domain Lip Closure: Interlabial escape, no progression to anterior lip Tongue control during bolus hold: Not tested Bolus preparation/mastication: Slow prolonged chewing/mashing with complete recollection Bolus transport/lingual motion: Delayed initiation of tongue motion (oral holding) Oral residue: Residue collection on oral structures Location of oral residue : Floor of mouth; Tongue; Palate Initiation of pharyngeal swallow : Pyriform sinuses  Pharyngeal Impairment Domain: Pharyngeal Impairment Domain Soft palate elevation: Escape to nasal cavity Laryngeal elevation: Partial superior movement of thyroid cartilage/partial approximation of arytenoids to epiglottic petiole Anterior hyoid excursion: Partial anterior movement Epiglottic movement: No inversion Laryngeal vestibule closure: Incomplete, narrow column air/contrast in laryngeal vestibule Pharyngeal stripping wave : Present - diminished Pharyngeal contraction (A/P view only): N/A Pharyngoesophageal segment opening: Complete distension and complete duration, no obstruction of flow Tongue base retraction: Wide column of contrast or air between tongue base and PPW Pharyngeal residue: Majority of contrast within or on pharyngeal structures Location of pharyngeal residue: Tongue base; Valleculae; Pharyngeal wall;  Diffuse (>3 areas)  Esophageal Impairment Domain: No data recorded Pill: No data recorded Penetration/Aspiration Scale Score: Penetration/Aspiration Scale Score 2.  Material enters airway, remains ABOVE vocal cords then ejected out: Puree 3.  Material enters airway, remains ABOVE vocal cords and not ejected out:  Moderately thick liquids (Level 3, honey thick) 7.  Material enters airway, passes BELOW cords and not ejected out despite cough attempt by patient: Mildly thick liquids (Level 2, nectar thick) 8.  Material enters airway, passes BELOW cords without attempt by patient to eject out (silent aspiration) : Thin liquids (Level 0) Compensatory Strategies: Compensatory Strategies Compensatory strategies: No   General Information: Caregiver present: No  Diet Prior to this Study: Dysphagia 1 (pureed); Extremely thick liquids (Level 4, pudding thick)   Temperature : Normal   Respiratory Status: WFL   Supplemental O2: None (Room air)   History of Recent Intubation: No  Behavior/Cognition: Confused; Agitated Self-Feeding Abilities: Needs hand-over-hand assist for feeding Baseline vocal quality/speech: Hypophonia/low volume Volitional Cough: Unable to elicit Volitional Swallow: Unable to elicit Exam Limitations: Fatigue Goal Planning: Prognosis for improved oropharyngeal function: Fair Barriers to Reach Goals: Cognitive deficits; Time post onset; Severity of deficits No data recorded Patient/Family Stated Goal: pt's son is thankful for more information Consulted and agree with results and recommendations: Patient; Family member/caregiver; Advanced practice provider Pain: Pain Assessment Pain Assessment: Faces Faces Pain Scale: 2 Pain Location: generalized with repositioning Pain Descriptors / Indicators: Discomfort; Grimacing Pain Intervention(s): Monitored during session End of Session: Start Time:SLP Start Time (ACUTE ONLY): 1519 Stop Time: SLP Stop Time (ACUTE ONLY): 1605 Time Calculation:SLP Time Calculation (min) (ACUTE ONLY): 46 min Charges: SLP Evaluations $ SLP Speech Visit: 1 Visit SLP Evaluations $MBS Swallow: 1 Procedure $Swallowing Treatment: 1 Procedure SLP visit diagnosis: SLP Visit Diagnosis: Dysphagia, oropharyngeal phase (R13.12) Past Medical History: Past Medical History: Diagnosis Date  Acute on chronic  respiratory failure with hypoxia (HCC)   Cardiac arrest (HCC)   Cardiac arrest (HCC)   Chronic atrial fibrillation (HCC)   COVID-19 virus infection   Hypertension  Past Surgical History: Past Surgical History: Procedure Laterality Date  HIP SURGERY    IR REPLC GASTRO/COLONIC TUBE PERCUT W/FLUORO  03/16/2020  IR REPLC GASTRO/COLONIC TUBE PERCUT W/FLUORO  04/07/2020 Marlou Porch., CF-SLP Speech Language Pathology, Acute Rehabilitation Services Secure Chat preferred [phone] 12/03/2022, 5:08 PM     Signature  -   Susa Raring M.D on 12/07/2022 at 11:10 AM   -  To page go to [email or web address]

## 2022-12-07 NOTE — Significant Event (Signed)
Rapid Response Event Note   Reason for Call :  Congested breathing, SpO2-88-93% on 3L [place]  Initial Focused Assessment:  Pt lying in bed with eyes closed. His breathing is tachypneic, congested, labored. He is having retractions. He will move all extremities purposefully but will not follow commands or speak. Lungs with rhonchi t/o, decreased air movement. Skin warm/dry.   HR--103, BP-135/82, RR-40, SpO2 92% on 3L [place].  Interventions:  NTS-small amount tan/white thick secretions NRB>VM PCXR-Cardiac enlargement. Chronic scarring in the lungs. Suggestion of developing perihilar infiltrate on the right. Small right pleural effusion. ABG-7.35/69/42/38.3 CBC CMP Xopenex Mucomyst PCCM consulted Plan of Care:  Pt's breathing is still labored despite breathing treatments, increased oxygen, and NT suctioning. Full code status verified by [facility] MD. PCCM to bedside to see pt. Plan to continue to monitor pt.  Please call RRT if further assistance needed.  Event Summary:   MD Notified: Dr. Janalyn Shy notified by bedside RN and came to bedside. Dr. Wynona Neat, PCCM MD to consulted and to bedside.  Call Time:0132 Arrival [id number] End HYQM:[id number]  Terrilyn Saver, RN

## 2022-12-07 NOTE — Significant Event (Addendum)
Acute hypoxic respiratory failure: Acute metabolic encephalopathy/altered mentation in the setting of acute hypoxic and hypercarbic respiratory failure Rapid response called around 2 AM as patient found to have respiratory distress O2 sat dropping to 83 to 90% on 3 L nasal cannula oxygen.  At bedside physical exam showing patient nasal cannula in his mouth and O2 sat 93 to 94%, bilateral upper and lower lung field rhonchi.  Patient is almost end which is around his baseline due to underlying dementia.  -Obtaining stat chest x-ray, ABG, CBC and CMP. - Starting Xopenex, ipratropium and Mucomyst nebulizer treatment every 6 hours and continue DuoNeb as needed. -If chest x-ray shows significant pulmonary edema will give IV Lasix. -Continue facemask oxygenation versus high flow 8 to 10 L to keep O2 sats above 92%.  Patient is a mouth breather and due to dementia continues to take [place] oxygen.  Continue bilateral hand soft mitten.   Per chart review patient has been admitted for management for failure to thrive, dysphagia, poor oral intake, dehydration, AKI and acute metabolic encephalopathy superimposed on dementia.  Palliative team has been consulted and patient is full code.   Addendum,  ABG showing patient is retaining pCO2 69, pO2 42 and bicarb 38. - Ordering BiPAP.  Requested for a bedside sitter for observation as patient as the patient is almost in and high risk of aspiration while on BiPAP. -Patient is very somnolent and there is risk for aspiration with BiPAP however as patient is retaining pCO2 69 plan to continue BiPAP for now.  There is no bedside sitter available at this time in the hospital.  There is high risk for decompensation  .-Consulted PCCM Dr. Roseanna Rainbow for concern for respiratory distress and unable to continue BiPAP in the setting of altered mentation. -Called and verified with patient's son and daughter over phone, family wish to keep patient Full code in the terms of CPR, intubation  mechanical ventilation if needed.  Addendum: -Intensivist Dr. Roseanna Rainbow has been evaluated the patient did not recommended BiPAP for now plan to continue facemask oxygen.  Recommended to continue respiratory toileting, bronchodilator treatment. -Appreciate ICU input.  Tereasa Coop, MD [facility] 12/07/2022, 4:09 AM

## 2022-12-07 NOTE — Progress Notes (Signed)
  I have received a call from patient's family 6-7 times overnight and patient's family is very worried and fixated about low albumin as patient is not eating well.  They have requesting multiple times to give him albumin and they believe with giving albumin patient is overall condition will be improved.  I have tried to explain to him multiple times over phone for more than 30 to 35 minutes that our concern for the patient is that he has acute hypoxic respiratory failure and we are managing with high flow oxygen and also explained that was unable to do BiPAP overnight as patient has high risk of aspiration.  Patient's daughter and son's phone number and name listed on the chart.  Both of them stated that if patient needs intubation and cardiac compressions are okay about all aggressive management but again they goes back to asking for blood albumin level and patient's daughter specifically say that as patient's albumin is 2.8 it should be at least above 3 and she is requesting for to give IV albumin.  -I have ordered IV albumin 25 g per patient's family request and satisfaction.

## 2022-12-07 NOTE — Progress Notes (Signed)
Report given to oncoming RN at bedside.  All VSS.  Patient still in Afib.  Venturi mask at 14L and O2 sats 94%.

## 2022-12-07 NOTE — Progress Notes (Signed)
Pharmacy Antibiotic Note  Robert Carpenter is a 89 y.o. male admitted on 12/05/2022, now with concern for aspiration pneumonia.  Pharmacy has been consulted for Unasyn dosing.  Plan: Unasyn 3g IV Q6H.  Height: 6\' 2"  (188 cm) Weight: 62.3 kg (137 lb 5.6 oz) IBW/kg (Calculated) : 82.2  Temp (24hrs), Avg:97.8 F (36.6 C), Min:97.6 F (36.4 C), Max:97.9 F (36.6 C)  Recent Labs  Lab 12/02/22 0620 12/03/22 0517 12/05/22 1215 12/06/22 0457 12/07/22 0322  WBC 7.6 5.9 10.3 8.3 18.2*  CREATININE 1.04 1.20 1.18 1.36* 1.49*    Estimated Creatinine Clearance: 30.8 mL/min (A) (by C-G formula based on SCr of 1.49 mg/dL (H)).    No Known Allergies   Thank you for allowing pharmacy to be a part of this patient's care.  Vernard Gambles, PharmD, BCPS  12/07/2022 6:17 AM

## 2022-12-07 NOTE — Progress Notes (Signed)
   12/07/22 0200  Assess: MEWS Score  Temp 97.9 F (36.6 C)  BP 135/82  MAP (mmHg) 93  Pulse Rate (!) 110  ECG Heart Rate (!) 120  Resp (!) 30  Level of Consciousness Responds to Pain  SpO2 92 %  O2 Device Non-rebreather Mask  Patient Activity (if Appropriate) In bed  O2 Flow Rate (L/min) 14 L/min  Assess: MEWS Score  MEWS Temp 0  MEWS Systolic 0  MEWS Pulse 2  MEWS RR 2  MEWS LOC 2  MEWS Score 6  MEWS Score Color Red  Assess: if the MEWS score is Yellow or Red  Were vital signs accurate and taken at a resting state? Yes  Does the patient meet 2 or more of the SIRS criteria? No  Does the patient have a confirmed or suspected source of infection? Yes  MEWS guidelines implemented  Yes, red  Treat  MEWS Interventions Considered administering scheduled or prn medications/treatments as ordered  Take Vital Signs  Increase Vital Sign Frequency  Red: Q1hr x2, continue Q4hrs until patient remains green for 12hrs  Escalate  MEWS: Escalate Red: Discuss with charge nurse and notify provider. Consider notifying RRT. If remains red for 2 hours consider need for higher level of care  Notify: Charge Nurse/RN  Name of Charge Nurse/RN Notified Gaffer  Provider Notification  Provider Name/Title Dr.Sundil  Date Provider Notified 12/07/22  Time Provider Notified 0200  Method of Notification Page  Notification Reason Change in status  Provider response At bedside;See new orders  Date of Provider Response 12/14/22  Time of Provider Response 0200  Notify: Rapid Response  Name of Rapid Response RN Notified Mindy rn  Date Rapid Response Notified 12/07/22  Time Rapid Response Notified 0130  Assess: SIRS CRITERIA  SIRS Temperature  0  SIRS Pulse 1  SIRS Respirations  1  SIRS WBC 0  SIRS Score Sum  2   Patient is in respiratory distress.Sats are at 88-93% at 3ltr [place].Respiratory therapist  and rapid at bedside.Paged Dr.Sundil.Pt is placed on non re breather.C-xray,ABG ordered. ABG result;  PCO2 -69 PCCM consulted,not recommended BiPAP at this time.Patient is placed on venturi mask.One time dose of Diamox 500mg  iv given.Patient spO2 at 98% in 14ltr venturi mask.

## 2022-12-08 ENCOUNTER — Inpatient Hospital Stay (HOSPITAL_COMMUNITY): Payer: Medicare Other

## 2022-12-08 DIAGNOSIS — Z7189 Other specified counseling: Secondary | ICD-10-CM | POA: Diagnosis not present

## 2022-12-08 DIAGNOSIS — Z515 Encounter for palliative care: Secondary | ICD-10-CM | POA: Diagnosis not present

## 2022-12-08 DIAGNOSIS — E860 Dehydration: Secondary | ICD-10-CM | POA: Diagnosis not present

## 2022-12-08 LAB — BASIC METABOLIC PANEL
Anion gap: 11 (ref 5–15)
BUN: 28 mg/dL — ABNORMAL HIGH (ref 8–23)
CO2: 33 mmol/L — ABNORMAL HIGH (ref 22–32)
Calcium: 8.4 mg/dL — ABNORMAL LOW (ref 8.9–10.3)
Chloride: 101 mmol/L (ref 98–111)
Creatinine, Ser: 1.62 mg/dL — ABNORMAL HIGH (ref 0.61–1.24)
GFR, Estimated: 41 mL/min — ABNORMAL LOW (ref >60)
Glucose, Bld: 118 mg/dL — ABNORMAL HIGH (ref 70–99)
Potassium: 4 mmol/L (ref 3.5–5.1)
Sodium: 145 mmol/L (ref 135–145)

## 2022-12-08 LAB — CBC WITH DIFFERENTIAL/PLATELET
Abs Immature Granulocytes: 0 10*3/uL (ref 0.00–0.07)
Basophils Absolute: 0 10*3/uL (ref 0.0–0.1)
Basophils Relative: 0 %
Eosinophils Absolute: 0 10*3/uL (ref 0.0–0.5)
Eosinophils Relative: 0 %
HCT: 46.9 % (ref 39.0–52.0)
Hemoglobin: 15 g/dL (ref 13.0–17.0)
Lymphocytes Relative: 1 %
Lymphs Abs: 0.2 10*3/uL — ABNORMAL LOW (ref 0.7–4.0)
MCH: 32.1 pg (ref 26.0–34.0)
MCHC: 32 g/dL (ref 30.0–36.0)
MCV: 100.4 fL — ABNORMAL HIGH (ref 80.0–100.0)
Monocytes Absolute: 0.4 10*3/uL (ref 0.1–1.0)
Monocytes Relative: 2 %
Neutro Abs: 17.4 10*3/uL — ABNORMAL HIGH (ref 1.7–7.7)
Neutrophils Relative %: 97 %
Platelets: 142 10*3/uL — ABNORMAL LOW (ref 150–400)
RBC: 4.67 MIL/uL (ref 4.22–5.81)
RDW: 16.3 % — ABNORMAL HIGH (ref 11.5–15.5)
WBC: 17.9 10*3/uL — ABNORMAL HIGH (ref 4.0–10.5)
nRBC: 0 % (ref 0.0–0.2)
nRBC: 0 /100{WBCs}

## 2022-12-08 LAB — PHOSPHORUS: Phosphorus: 4.4 mg/dL (ref 2.5–4.6)

## 2022-12-08 LAB — MAGNESIUM: Magnesium: 2 mg/dL (ref 1.7–2.4)

## 2022-12-08 LAB — C-REACTIVE PROTEIN: CRP: 26.5 mg/dL — ABNORMAL HIGH (ref <1.0)

## 2022-12-08 LAB — BRAIN NATRIURETIC PEPTIDE: B Natriuretic Peptide: 587.8 pg/mL — ABNORMAL HIGH (ref 0.0–100.0)

## 2022-12-08 MED ORDER — LACTATED RINGERS IV SOLN: INTRAVENOUS | Status: DC

## 2022-12-08 MED ORDER — LEVALBUTEROL HCL 0.63 MG/3ML IN NEBU
0.6300 mg | INHALATION_SOLUTION | Freq: Two times a day (BID) | RESPIRATORY_TRACT | Status: DC
  Filled 2022-12-08: qty 3

## 2022-12-08 MED ORDER — IPRATROPIUM BROMIDE 0.02 % IN SOLN
0.5000 mg | Freq: Two times a day (BID) | RESPIRATORY_TRACT | Status: DC
  Filled 2022-12-08: qty 2.5

## 2022-12-08 MED ORDER — ACETYLCYSTEINE 20 % IN SOLN
3.0000 mL | Freq: Two times a day (BID) | RESPIRATORY_TRACT | Status: DC
  Filled 2022-12-08 (×2): qty 4

## 2022-12-08 MED ORDER — SODIUM CHLORIDE 0.9 % IV SOLN: 3.0000 g | Freq: Two times a day (BID) | INTRAVENOUS | Status: DC

## 2022-12-09 DIAGNOSIS — E860 Dehydration: Secondary | ICD-10-CM | POA: Diagnosis not present

## 2023-01-02 NOTE — Progress Notes (Signed)
   12/16/2022 1838  Spiritual Encounters  Type of Visit Initial  Conversation partners present during encounter Nurse  Referral source Code page  Reason for visit Code  OnCall Visit Yes   Chaplain responded to a code blue. No family is present at this time. Patient's RN was able to connect with family via phone. Family may be coming in. Spiritual care services available as needed.   Alda Ponder, Chaplain 12/20/2022

## 2023-01-02 NOTE — Progress Notes (Signed)
PT Cancellation Note  Patient Details Name: Robert Carpenter MRN: [id number] DOB: 03-23-1933   Cancelled Treatment:    Reason Eval/Treat Not Completed: (P) Patient not medically ready. Patient on venturi mask with eyes closed, non-responsive to noxious stimuli such as sternal rub. We will continue to follow.   Jamesetta Geralds, PT, DPT [facility] Rehabilitation Department Office: [phone]  Jamesetta Geralds 12/30/2022, 11:56 AM

## 2023-01-02 NOTE — Progress Notes (Signed)
[facility] Organ donation couldnot be reached out at this moment.

## 2023-01-02 NOTE — Progress Notes (Addendum)
PROGRESS NOTE                                                                                                                                                                                                             Patient Demographics:    Robert Carpenter, is a 89 y.o. male, DOB - [birth year], WGN:[id number]  Outpatient Primary MD for the patient is Shayne Alken, MD    LOS - 3  Admit date - 12/05/2022    Chief Complaint  Patient presents with   Weakness       Brief Narrative (HPI from H&P)   89 y.o. male, with known history of ILD, dysphagia, aspiration pneumonia, severe COVID-19 pneumonia infection in the past with reoccurrence few weeks ago requiring hospital admission was recently discharged on 12/04/2022, suspected undiagnosed early dementia, poor oral intake, failure to thrive, chronic A-fib on Eliquis, history of DVT, prior history of cardiac arrest, prior history of intubation requiring trach and PEG due to severe COVID-19 few years ago, chronic prednisone use.   Patient with above history was admitted to this hospital on 11/24/2022 from SNF with decreased mental status, decreased p.o. intake, failure to thrive, dehydration, he does have the symptoms at baseline but symptoms got considerably worse, he was diagnosed with another bout of COVID-19 infection with acute metabolic encephalopathy, poor oral intake severe dehydration and admitted to the hospital.  With supportive care he showed some improvement however his oral intake was very poor and he frequently required IV D50 to augment his CBGs.  We had long discussion with patient's son prior to his discharge on 12/04/2022 that his father's oral intake is very poor and that it looks like he is nearing his and, we encouraged him to think about comfort measures, he said he is quite religious and he will discuss this with his sister.   Patient subsequently went to SNF  however he did not eat or drink at all since he was at [facility] and he was sent to the hospital for dehydration and generalized weakness.  He is currently awake however he has encephalopathy and will not answer questions, appears to be in no distress.  Lab work consistent with dehydration, mild hyperkalemia, I was requested to admit him for poor oral intake, failure to thrive, metabolic encephalopathy, dehydration and hyperkalemia.   Subjective:  Robert Carpenter today in bed quite confused, unable to provide any review of systems.   Assessment  & Plan :    Poor oral intake, failure to thrive, severe protein calorie malnutrition, cachexia, chronic history of poor oral intake, oropharyngeal dysphagia acute on chronic, dehydration, AKI.   Patient has had gradually progressive dysphagia and failure to thrive along with poor oral intake which has been ongoing for several months, he recently had COVID-19 infection few weeks ago which made the symptoms worse, he was discharged to SNF on 12/04/2022 and son was updated on patient's extremely poor oral intake and the likelihood of him not doing well, he was encouraged to think about comfort measures however he wanted to get some more time to think.   Unfortunately as expected patient did not eat or drink well at SNF and is back within a day, we will consult palliative care for goals of care, I have called the son again already however he still wants more time to think about future course of action for now he wants everything done and wants his dad to be full code.  Will commence with IV fluids with D5, continue Remeron and add Megace, he is on chronic 10 mg of oral prednisone for his underlying lung issues I will switch it to IV steroids which may augment his appetite, speech therapy will be reinvolved for his underlying dysphagia and he will be kept on soft diet as tolerated with feeding assistance and aspiration precautions.   His long-term prognosis again looks  quite poor and looks like he is gradually declining due to his advanced age and most likely underlying early dementia.  He is clearly rapidly declining and actively dying, I have communicated this clearly to patient's son multiple times, family still wants more time to decide on future course of action.  Has been counseled by me, multiple PCCM physicians, palliative care physicians.   DNR - son called 12/31/2022 - 11.09 am   Aspiration pneumonia night of 12/06/2022.  Unasyn, he is aspirating his oral secretions, again prognosis is very grim.  Explained family in detail on 12/07/2022, PCCM also assisted in the conversation and meeting.  Family still wants to hold off on comfort measures.  Will place on Unasyn and monitor.    Dehydration, AKI with hyperkalemia.  All likely due to extremely poor oral intake with severe protein calorie malnutrition.  He is now third spacing fluid, patient's daughter wants a trial of IV albumin and Lasix, clearly told that Lasix at times can help but at times can cause worsening of renal function as well, however she said she is requesting for trial of Lasix to see how the urine output and renal function does after it.  Will order a dose of IV albumin and Lasix, renal ultrasound is nonacute, no urinary retention on bladder scans.  Avoid nephrotoxins and continue to monitor.   Acute metabolic encephalopathy superimposed on dementia:  Etiology multifactorial likely due to oral intake on top of his underlying dementia, low-dose Seroquel, minimize benzodiazepines and narcotics, negative head CT, stable TSH and ammonia, UA does not show any UTI, continue to monitor bladder scans to rule out any retention.   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  [place] vas 2 score of greater than 3. Rate  controlled continue beta-blocker and Eliquis.  As needed IV Cardizem.   Essential hypertension: As he is dehydrated will drop his home beta-blocker dose, as needed IV Cardizem and hydralazine on board.   Mild thrombocytopenia: Likely due to COVID-19 infection.  Monitor intermittently.   Recent acute urinary retention: Continue Flomax, monitor bladder scans, required Foley last admission.  Monitor.   Oropharyngeal dysphagia: Speech evaluated the patient now on a dysphagia 1 diet.  Dry oral mucosa with trach oral secretions coating his tongue, oral care per RN, nystatin swish and swallow although chances of him having fungal infection seem very less on exam.   Severe protein calorie malnutrition.  Protein supplements if tolerated oral diet, IV albumin upon family request on 12/07/2022.    History of severe COVID-19 infection in 2021 with prolonged vent requirement trach and PEG (decannulated and PEG removed)/now with pulmonary fibrosis:  Noted has been weaned to room air.  Oral steroids switched to IV due to poor oral intake, chest x-ray nonacute currently on room air.  Continue to monitor.         Condition - Extremely Guarded  Family Communication  :   son [name] [phone]  detail on 12/05/2022, son [name] in detail along with patient's daughter over the phone on 12/07/2022 in the presence of critical care team, clearly explained that patient is actively dying.  They want more time to think about it.  Discussed with son [name] again on 12/07/2018 24 x 3 with PCCM attendings as well.  Called son [name] 12/19/2022 at 9:12 AM.  Micah Flesher to answering machine for cell phone switched off.  Code Status : DNR - son called 12/30/2022 - 11.09 am  Consults  : Palliative care, PCCM  PUD Prophylaxis : PPI   Procedures  :     CT head - non acute      Disposition Plan  :    Status is: Inpatient   DVT Prophylaxis  :    Place and maintain sequential compression device Start: 12/07/22 1105 SCDs  Start: 12/05/22 1556 apixaban (ELIQUIS) tablet 5 mg     Lab Results  Component Value Date   PLT 142 (L) 12/29/2022    Diet :  Diet Order             DIET - DYS 1 Room service appropriate? Yes; Fluid consistency: Honey Thick  Diet effective now                    Inpatient Medications  Scheduled Meds:  (feeding supplement) PROSource Plus  30 mL Oral BID BM   acetylcysteine  3 mL Nebulization Q6H   amantadine  100 mg Oral Daily   apixaban  5 mg Oral BID   ipratropium  0.5 mg Nebulization Q6H   levalbuterol  0.63 mg Nebulization Q6H   megestrol  40 mg Oral Daily   methylPREDNISolone (SOLU-MEDROL) injection  30 mg Intravenous Q24H   metoprolol tartrate  5 mg Intravenous Q8H   mirtazapine  7.5 mg Oral QHS   nystatin  5 mL Oral QID   pantoprazole (PROTONIX) IV  40 mg Intravenous Q24H   QUEtiapine  25 mg Oral BID   tamsulosin  0.4 mg Oral Daily   Continuous Infusions:  sodium chloride 50 mL/hr at 12/07/22 1238   ampicillin-sulbactam (UNASYN) IV 3 g (12/07/2022 0600)   PRN Meds:.acetaminophen, diltiazem, haloperidol lactate, hydrALAZINE, [DISCONTINUED] ondansetron **OR** ondansetron (ZOFRAN) IV, senna-docusate  Antibiotics  :    Anti-infectives (From admission, onward)    Start  Dose/Rate Route Frequency Ordered Stop   12/07/22 0630  Ampicillin-Sulbactam (UNASYN) 3 g in sodium chloride 0.9 % 100 mL IVPB        3 g 200 mL/hr over 30 Minutes Intravenous Every 6 hours 12/07/22 0615           Objective:   Vitals:   12/31/2022 0701 12/22/2022 0703 12/22/2022 0800 12/04/2022 0846  BP: (!) 88/65 100/70 107/67   Pulse: 95 80 95 (!) 102  Resp: (!) 27 (!) 22 (!) 21 (!) 22  Temp: (!) 97.4 F (36.3 C)  98.1 F (36.7 C)   TempSrc: Axillary  Axillary   SpO2: 96% 96% 97% 98%  Weight:      Height:        Wt Readings from Last 3 Encounters:  12/05/22 62.3 kg  12/04/22 62.3 kg  07/18/21 78.6 kg     Intake/Output Summary (Last 24 hours) at 12/24/2022 0912 Last data  filed at 12/07/2022 2048 Gross per 24 hour  Intake 50 ml  Output 1100 ml  Net -1050 ml     Physical Exam  Elderly, frail, cachectic African-American male, awake but confused,, No new F.N deficits, oral mucosa, mentation worse on 12/12/2022 [place].AT,PERRAL Supple Neck, No JVD,   Symmetrical Chest wall movement, Good air movement bilaterally, coarse bibasilar breath sounds right worse than left RRR,No Gallops,Rubs or new Murmurs,  +ve B.Sounds, Abd Soft, No tenderness,   No Cyanosis, Clubbing or edema        Data Review:    Recent Labs  Lab 12/02/22 0620 12/03/22 0517 12/05/22 1215 12/06/22 0457 12/07/22 0322 12/22/2022 0421  WBC 7.6 5.9 10.3 8.3 18.2* 17.9*  HGB 14.8 14.2 15.7 12.9* 15.0 15.0  HCT 44.2 42.6 49.4 39.5 45.7 46.9  PLT 142* 147* 159 181 197 142*  MCV 95.3 93.4 99.2 93.4 95.2 100.4*  MCH 31.9 31.1 31.5 30.5 31.3 32.1  MCHC 33.5 33.3 31.8 32.7 32.8 32.0  RDW 15.3 15.0 14.9 14.8 15.6* 16.3*  LYMPHSABS 0.8 0.8  --  0.4* 0.3* 0.2*  MONOABS 0.4 0.4  --  0.4 0.5 0.4  EOSABS 0.0 0.0  --  0.0 0.0 0.0  BASOSABS 0.0 0.0  --  0.0 0.1 0.0    Recent Labs  Lab 12/02/22 0620 12/02/22 0956 12/03/22 0517 12/05/22 1215 12/05/22 1647 12/06/22 0457 12/07/22 0322 12/07/22 0324 12/22/2022 0421  NA 142  --  141 138  --  136 144  --  145  K 4.5  --  4.2 5.6* 4.0 4.0 4.1  --  4.0  CL 103  --  99 98  --  95* 99  --  101  CO2 26  --  31 23  --  33* 32  --  33*  ANIONGAP 13  --  11 17*  --  8 13  --  11  GLUCOSE 125*  --  150* 91  --  204* 142*  --  118*  BUN 31*  --  30* 32*  --  32* 29*  --  28*  CREATININE 1.04  --  1.20 1.18  --  1.36* 1.49*  --  1.62*  AST  --   --   --  40  --   --  31  --   --   ALT  --   --   --  30  --   --  32  --   --   ALKPHOS  --   --   --  47  --   --  53  --   --   BILITOT  --   --   --  1.9*  --   --  1.6*  --   --   ALBUMIN  --   --   --  2.8*  --   --  2.8*  --   --   CRP 6.7*  --  4.6*  --  2.8* 2.3* 12.3*  --  26.5*  PROCALCITON  --   0.10 0.10  --   --   --  0.56  --   --   TSH  --   --   --   --  3.046  --   --   --   --   AMMONIA  --   --   --   --  24  --   --   --   --   BNP 939.5*  --  1,115.5*  --   --  1,052.7*  --  704.4* 587.8*  MG 1.9  --  2.1  --   --  1.9 1.9  --  2.0  CALCIUM 8.8*  --  8.7* 8.7*  --  8.3* 8.9  --  8.4*      Recent Labs  Lab 12/02/22 0620 12/02/22 0956 12/03/22 0517 12/05/22 1215 12/05/22 1647 12/06/22 0457 12/07/22 0322 12/07/22 0324 12/26/2022 0421  CRP 6.7*  --  4.6*  --  2.8* 2.3* 12.3*  --  26.5*  PROCALCITON  --  0.10 0.10  --   --   --  0.56  --   --   TSH  --   --   --   --  3.046  --   --   --   --   AMMONIA  --   --   --   --  24  --   --   --   --   BNP 939.5*  --  1,115.5*  --   --  1,052.7*  --  704.4* 587.8*  MG 1.9  --  2.1  --   --  1.9 1.9  --  2.0  CALCIUM 8.8*  --  8.7* 8.7*  --  8.3* 8.9  --  8.4*    --------------------------------------------------------------------------------------------------------------- No results found for: "CHOL", "HDL", "LDLCALC", "LDLDIRECT", "TRIG", "CHOLHDL"  Lab Results  Component Value Date   HGBA1C 5.8 (H) 03/10/2020   Recent Labs    12/05/22 1647  TSH 3.046   No results for input(s): "VITAMINB12", "FOLATE", "FERRITIN", "TIBC", "IRON", "RETICCTPCT" in the last 72 hours. ------------------------------------------------------------------------------------------------------------------ Cardiac Enzymes No results for input(s): "CKMB", "TROPONINI", "MYOGLOBIN" in the last 168 hours.  Invalid input(s): "CK"  Micro Results No results found for this or any previous visit (from the past 240 hour(s)).  Radiology Reports DG Chest Port 1 View  Result Date: 12/07/2022 CLINICAL DATA:  Acute respiratory disease.  Shortness of breath. EXAM: PORTABLE CHEST 1 VIEW COMPARISON:  12/05/2022 FINDINGS: Shallow inspiration. Cardiac enlargement. Linear fibrosis or scarring in the lung bases. More prominent infiltration in the right  perihilar region may represent superimposed asymmetric edema or consolidation. Probable right pleural effusion. No pneumothorax. Calcified and tortuous aorta. Degenerative changes in the spine and shoulders. IMPRESSION: Cardiac enlargement. Chronic scarring in the lungs. Suggestion of developing perihilar infiltrate on the right. Small right pleural effusion. Electronically Signed   By: Burman Nieves M.D.   On: 12/07/2022 03:03   US RENAL  Result Date: 12/06/2022  CLINICAL DATA:  Acute renal insufficiency. EXAM: RENAL / URINARY TRACT ULTRASOUND COMPLETE COMPARISON:  None Available. FINDINGS: Evaluation is limited due to patient's positioning. Right Kidney: Renal measurements: 7.5 x 3.3 x 3.9 cm = volume: 50 mL. There is diffuse increased renal parenchymal echogenicity. No hydronephrosis or shadowing stone. Left Kidney: Renal measurements: 8.4 x 4.5 x 3.4 cm = volume: 67 mL. Diffuse increased renal parenchymal echogenicity. No hydronephrosis or shadowing stone. Bladder: Appears normal for degree of bladder distention. Other: A right sided pleural effusion is partially visualized. IMPRESSION: 1. Echogenic kidneys may represent medical renal disease. No hydronephrosis or shadowing stone. 2. Right-sided pleural effusion. Electronically Signed   By: Elgie Collard M.D.   On: 12/06/2022 23:11   CT HEAD WO CONTRAST ( )  Result Date: 12/05/2022 CLINICAL DATA:  Altered mental status EXAM: CT HEAD WITHOUT CONTRAST TECHNIQUE: Contiguous axial images were obtained from the base of the skull through the vertex without intravenous contrast. RADIATION DOSE REDUCTION: This exam was performed according to the departmental dose-optimization program which includes automated exposure control, adjustment of the mA and/or kV according to patient size and/or use of iterative reconstruction technique. COMPARISON:  11/24/2022 FINDINGS: Brain: No evidence of acute infarction, hemorrhage, mass, mass effect, or midline shift. No  hydrocephalus or extra-axial fluid collection. Periventricular white matter changes, likely the sequela of chronic small vessel ischemic disease. Age related cerebral atrophy. Vascular: No hyperdense vessel. Skull: Negative for fracture or focal lesion. Sinuses/Orbits: No acute finding. Other: The mastoid air cells are well aerated. Redemonstrated metallic fragments in the scalp. Incidental note is made of high density material lining the patient's tongue and palate, which is new from the prior exam IMPRESSION: 1. No acute intracranial process. 2. Incidental note is made of high density material lining the patient's tongue and palate, which is new from the prior exam. Correlate with physical exam. Electronically Signed   By: Wiliam Ke M.D.   On: 12/05/2022 18:25   DG Chest Port 1 View  Result Date: 12/05/2022 CLINICAL DATA:  Weakness. EXAM: PORTABLE CHEST 1 VIEW COMPARISON:  12/02/2022. FINDINGS: Low lung volume. Redemonstration of diffuse chronic interstitial markings throughout bilateral lungs with relative sparing of the left upper and mid lung zones, essentially similar to the prior study. Findings may represent underlying lung fibrosis/scarring. No acute consolidation or lung collapse seen. There is probable small layering right pleural effusion. No significant left pleural effusion. No pneumothorax on either side. Stable cardio-mediastinal silhouette. No acute osseous abnormalities. The soft tissues are within normal limits. IMPRESSION: *Essentially stable exam. Redemonstration of diffuse chronic interstitial markings throughout bilateral lungs with relative sparing of the left upper and mid lung zones. *No acute consolidation or lung collapse. Electronically Signed   By: Jules Schick M.D.   On: 12/05/2022 14:00      Signature  -   Susa Raring M.D on 12/20/2022 at 9:12 AM   -  To page go to [email or web address]

## 2023-01-02 NOTE — Progress Notes (Signed)
OT Cancellation Note  Patient Details Name: Robert Carpenter MRN: [id number] DOB: July 29, 1933   Cancelled Treatment:    Reason Eval/Treat Not Completed: Patient not medically ready;Patient's level of consciousness (Patient on venturi mask with eyes closed, non-responsive to noxious stimuli. OT will follow-up with pt as able)  12/16/2022  AB, OTR/L  Acute Rehabilitation Services  Office: [phone]   Robert Carpenter 12/26/2022, 12:52 PM

## 2023-01-02 NOTE — Progress Notes (Signed)
   12/04/2022 0407  Vitals  BP 107/88  MAP (mmHg) 96  BP Location Left Arm  BP Method Automatic  Patient Position (if appropriate) Lying  Pulse Rate (!) 105  Pulse Rate Source Monitor  ECG Heart Rate (!) 115  Resp (!) 24  Level of Consciousness  Level of Consciousness Unresponsive  MEWS COLOR  MEWS Score Color Red  Oxygen Therapy  SpO2 92 %  O2 Device Venturi Mask  O2 Flow Rate (L/min) 14 L/min  FiO2 (%) 55 %  Patient Activity (if Appropriate) In bed  Pulse Oximetry Type Continuous  Pain Assessment  Pain Scale Faces  Pain Score 0  POSS Scale (Pasero Opioid Sedation Scale)  POSS *See Group Information* 4-INTERVENTION REQUIRED,Unacceptable,Somnolent, mininal or no response to verbal and physical stimulation  PCA/Epidural/Spinal Assessment  Respiratory Pattern Symmetrical;Dyspnea at rest  MEWS Score  MEWS Temp 0  MEWS Systolic 0  MEWS Pulse 2  MEWS RR 1  MEWS LOC 3  MEWS Score 6

## 2023-01-02 NOTE — Progress Notes (Signed)
Daily Progress Note   Patient Name: Robert Carpenter       Date: 12/22/2022 DOB: [birth year]  Age: 89 y.o. MRN#: [id number] Attending Physician: Robert Sea, MD Primary Care Physician: Robert Alken, MD Admit Date: 12/05/2022  Reason for Consultation/Follow-up: Establishing goals of care  Subjective: Patient sleeping with venturi mask on. Mitts on. Breathing is labored. No family at bedside.  Length of Stay: 3  Current Medications: Scheduled Meds:   (feeding supplement) PROSource Plus  30 mL Oral BID BM   acetylcysteine  3 mL Nebulization BID   amantadine  100 mg Oral Daily   apixaban  5 mg Oral BID   ipratropium  0.5 mg Nebulization BID   levalbuterol  0.63 mg Nebulization BID   megestrol  40 mg Oral Daily   methylPREDNISolone (SOLU-MEDROL) injection  30 mg Intravenous Q24H   metoprolol tartrate  5 mg Intravenous Q8H   mirtazapine  7.5 mg Oral QHS   nystatin  5 mL Oral QID   pantoprazole (PROTONIX) IV  40 mg Intravenous Q24H   QUEtiapine  25 mg Oral BID   tamsulosin  0.4 mg Oral Daily    Continuous Infusions:  ampicillin-sulbactam (UNASYN) IV 3 g (12/28/2022 1208)    PRN Meds: acetaminophen, diltiazem, haloperidol lactate, hydrALAZINE, [DISCONTINUED] ondansetron **OR** ondansetron (ZOFRAN) IV, senna-docusate  Physical Exam Vitals reviewed.  Constitutional:      General: He is sleeping.     Appearance: He is cachectic. He is ill-appearing.     Interventions: Face mask in place.     Comments: mitts  Cardiovascular:     Rate and Rhythm: Tachycardia present.  Pulmonary:     Effort: Tachypnea present.     Comments: Labored breathing Skin:    General: Skin is warm and dry.  Neurological:     Mental Status: He is unresponsive.             Vital Signs:  BP (!) 91/53   Pulse 96   Temp 98.2 F (36.8 C) (Axillary)   Resp (!) 30   Ht 6\' 2"  (1.88 m)   Wt 62.3 kg   SpO2 94%   BMI 17.63 kg/m  SpO2: SpO2: 94 % O2 Device: O2 Device: Venturi Mask O2 Flow Rate: O2 Flow Rate (L/min): 14 L/min  Palliative Assessment/Data:10%      Patient Active Problem List   Diagnosis Date Noted   Dehydration 12/05/2022   Hyperkalemia 12/05/2022   Protein-calorie malnutrition, severe 11/26/2022   Hypoxic respiratory failure (HCC) 11/25/2022   Hypercarbia 11/25/2022   Atrial flutter with rapid ventricular response (HCC) 11/25/2022   Acute encephalopathy 11/25/2022   Neurocognitive deficits 11/25/2022   Interstitial lung disease (HCC) 11/25/2022   Altered mental status 11/25/2022   Pneumonia due to COVID-19 virus 11/24/2022   Thyroid nodule 01/18/2021   Pulmonary edema 01/18/2021   Chronic systolic CHF (congestive heart failure) (HCC) 01/18/2021   Acute respiratory failure with hypoxia and hypercapnia (HCC) 01/18/2021   HTN (hypertension) 01/13/2021   Chronic atrial fibrillation (HCC)    COVID-19 virus infection    History of DVT (deep vein thrombosis) 02/19/2020   History of cardiac arrest 02/19/2020    Palliative Care Assessment & Plan   Patient Profile: 89 y.o. male  with past medical history of ILD, dysphagia, CHF, cardiac arrest, suspect undiagnosed dementia was admitted on 12/05/2022 with poor oral intake/failure to thrive/severe protein calorie malnutrition/chachexia/acute on chronic oropharyngeal dysphagia, dehydration with hyperkalemia, and acute metabolic encephalopathy superimposed on likely dementia.   Today's Discussion: Spoke by phone with patient's son Robert Carpenter. He shares that he understands his father's condition is worsening and that he had recently changed his father's code status to DNR/DNI. I shared my understanding of the patient's current illness including limitations of ongoing interventions and high risk for further  decline despite treatment efforts. He shares that he is trying to support his father's wishes the best he can and that his father has always wanted "to live." He tells me he wants to "cut to the chase" and that he wants his father to be comfortable. I share that we can make him comfortable if we change the focus from treating the treatable to comfort based care.   I share that once a person is transitioned to comfort care the patient would no longer receive aggressive medical interventions such as continuous vital signs, lab work, radiology testing, or medications not focused on comfort. All care would focus on how the patient is looking and feeling. This would include management of any symptoms that may cause discomfort, pain, shortness of breath, cough, nausea, agitation, anxiety, and/or secretions etc. Symptoms would be managed with medications and other non-pharmacological interventions. Robert Carpenter shared that comfort care sounds good as long as we can keep the patient's oxygen on. I share that we usually decrease the amount of oxygen once the patient is comfortable but we agree to keep the oxygen on the current setting but not escalate/ increase it. Then Robert Carpenter asks if we can transition the patient in phases-- not starting comfort medications. I shared that we would not start any continuous medications but would just provide medications when he was symptomatic. At first Robert Carpenter was agreeable but then he stated that he wants to keep his father on the current therapies. He wants to talk to family again before they make any decisions and he also wants to be able to be at the hospital (he is currently out of town). I shared that I was worried his father might decompensate further in the meantime. I offered to call him back later this afternoon to discuss goals of care further but he tells me to call him back tomorrow.  Encouraged Robert Carpenter to call with any questions or concerns before  tomorrow.  Recommendations/Plan: DNR/DNI Continue current treatment Encouraged family to continue conversations  re: goals of care/ comfort measures PMT will continue to support   Code Status:    Code Status Orders  (From admission, onward)           Start     Ordered   12/15/2022 1110  Do not attempt resuscitation (DNR)- Limited -Do Not Intubate (DNI)  (Code Status)  Continuous       Question Answer Comment  If pulseless and not breathing No CPR or chest compressions.   In Pre-Arrest Conditions (Patient Is Breathing and Has A Pulse) Do not intubate. Provide all appropriate non-invasive medical interventions. Avoid ICU transfer unless indicated or required.   Consent: Discussion documented in EHR or advanced directives reviewed      12/03/2022 1109         Extensive chart review has been completed prior to seeing the patient including labs, vital signs, imaging, progress/consult notes, orders, medications, and available advance directive documents.   Care plan was discussed with Dr. Thedore Mins  Time spent: 35 minutes  Thank you for allowing the Palliative Medicine Team to assist in the care of this patient.    Sherryll Burger, NP  Please contact Palliative Medicine Team phone at [phone] for questions and concerns.

## 2023-01-02 NOTE — Plan of Care (Signed)

## 2023-01-02 NOTE — Death Summary Note (Signed)
[facility] Death Note                                                                                                                                                                                               Robert Carpenter, is a 89 y.o. male, DOB - May 21, 1933, AVW:[id number]  Admit date - 12-31-2022   Admitting Physician Leroy Sea, MD  Outpatient Primary MD for the patient is Shayne Alken, MD  LOS - 4  Chief Complaint  Patient presents with   Weakness       Notification: Shayne Alken, MD notified of death of January 04, 2023   Admit Date:  12-31-22  Date of Death: Date of Death: Jan 03, 2023  Time of Death: Time of Death: January 10, 1830  Length of Stay: 4    Pronounced by -  RN  History of present illness:   Robert Carpenter is a 89 y.o. male with a history of - 89 y.o. male, with known history of ILD, dysphagia, aspiration pneumonia, severe COVID-19 pneumonia infection in the past with reoccurrence few weeks ago requiring hospital admission was recently discharged on 12/04/2022, suspected undiagnosed early dementia, poor oral intake, failure to thrive, chronic A-fib on Eliquis, history of DVT, prior history of cardiac arrest, prior history of intubation requiring trach and PEG due to severe COVID-19 few years ago, chronic prednisone use.   Patient with above history was admitted to this hospital on 11/24/2022 from SNF with decreased mental status, decreased p.o. intake, failure to thrive, dehydration, he does have the symptoms at baseline but symptoms got considerably worse, he was diagnosed with another bout of COVID-19 infection with acute metabolic encephalopathy, poor oral intake severe dehydration and admitted to the hospital.  With supportive care he showed some improvement however his oral intake was very poor and he frequently required IV D50 to augment his CBGs.  We had long  discussion with patient's son prior to his discharge on 12/04/2022 that his father's oral intake is very poor and that it looks like he is nearing his and, we encouraged him to think about comfort measures, he said he is quite religious and he will discuss this with his sister.   Patient subsequently went to SNF however he did not eat or drink at all since he was at [facility] and he was sent to the hospital for dehydration and generalized weakness.  He is currently awake however he has encephalopathy and will not answer questions, appears to be in no distress.  Lab work consistent with dehydration, mild hyperkalemia, I was requested to admit him for poor oral intake, failure to  thrive, metabolic encephalopathy, dehydration and hyperkalemia.  He was seen by [facility] care, Critical care and me, all providers were of the opinion that the patient was in a gradual terminal decline, FTT, Dehydration, AKI, Metabolic Encephalopathy, Severe PCM, Asp.PNA. He was treated with IVF, ABX, O2 but with no improvement, passed away on 2022-12-10 at 16.31 pm.   Final Diagnoses:  Cause if death - Aspiration Pneumonia  Signature  -    Susa Raring M.D on 12/31/2022 at 5:37 AM   -  To page go to [email or web address]   Total clinical and documentation time for today Under 30 minutes   Last Note                                                                     PROGRESS NOTE                                                                                                                                                                                                             Patient Demographics:    Robert Carpenter, is a 89 y.o. male, DOB - [birth year], IEP:[id number]  Outpatient Primary MD for the patient is Shayne Alken, MD    LOS - 4  Admit date - 12/05/2022    Chief Complaint  Patient presents with   Weakness       Brief Narrative (HPI from H&P)   89 y.o. male, with known history of ILD, dysphagia, aspiration  pneumonia, severe COVID-19 pneumonia infection in the past with reoccurrence few weeks ago requiring hospital admission was recently discharged on 12/04/2022, suspected undiagnosed early dementia, poor oral intake, failure to thrive, chronic A-fib on Eliquis, history of DVT, prior history of cardiac arrest, prior history of intubation requiring trach and PEG due to severe COVID-19 few years ago, chronic prednisone use.   Patient with above history was admitted to this hospital on 11/24/2022 from SNF with decreased mental status, decreased p.o. intake, failure to thrive, dehydration, he does have the symptoms at baseline but symptoms got considerably worse, he was diagnosed with another bout of COVID-19 infection with acute metabolic encephalopathy, poor oral intake severe dehydration and admitted to the hospital.  With supportive care he showed some improvement however his oral intake was very poor and he frequently required IV  D50 to augment his CBGs.  We had long discussion with patient's son prior to his discharge on 12/04/2022 that his father's oral intake is very poor and that it looks like he is nearing his and, we encouraged him to think about comfort measures, he said he is quite religious and he will discuss this with his sister.   Patient subsequently went to SNF however he did not eat or drink at all since he was at [facility] and he was sent to the hospital for dehydration and generalized weakness.  He is currently awake however he has encephalopathy and will not answer questions, appears to be in no distress.  Lab work consistent with dehydration, mild hyperkalemia, I was requested to admit him for poor oral intake, failure to thrive, metabolic encephalopathy, dehydration and hyperkalemia.   Subjective:    [name] today in bed quite confused, unable to provide any review of systems.   Assessment  & Plan :    Poor oral intake, failure to thrive, severe protein calorie malnutrition, cachexia,  chronic history of poor oral intake, oropharyngeal dysphagia acute on chronic, dehydration, AKI.   Patient has had gradually progressive dysphagia and failure to thrive along with poor oral intake which has been ongoing for several months, he recently had COVID-19 infection few weeks ago which made the symptoms worse, he was discharged to SNF on 12/04/2022 and son was updated on patient's extremely poor oral intake and the likelihood of him not doing well, he was encouraged to think about comfort measures however he wanted to get some more time to think.   Unfortunately as expected patient did not eat or drink well at SNF and is back within a day, we will consult palliative care for goals of care, I have called the son again already however he still wants more time to think about future course of action for now he wants everything done and wants his dad to be full code.  Will commence with IV fluids with D5, continue Remeron and add Megace, he is on chronic 10 mg of oral prednisone for his underlying lung issues I will switch it to IV steroids which may augment his appetite, speech therapy will be reinvolved for his underlying dysphagia and he will be kept on soft diet as tolerated with feeding assistance and aspiration precautions.   His long-term prognosis again looks quite poor and looks like he is gradually declining due to his advanced age and most likely underlying early dementia.  He is clearly rapidly declining and actively dying, I have communicated this clearly to patient's son multiple times, family still wants more time to decide on future course of action.  Has been counseled by me, multiple PCCM physicians, palliative care physicians.   DNR - son called 12/15/2022 - 11.09 am   Aspiration pneumonia night of 12/06/2022.  Unasyn, he is aspirating his oral secretions, again prognosis is very grim.  Explained family in detail on 12/07/2022, PCCM also assisted in the conversation and meeting.  Family  still wants to hold off on comfort measures.  Will place on Unasyn and monitor.    Dehydration, AKI with hyperkalemia.  All likely due to extremely poor oral intake with severe protein calorie malnutrition.  He is now third spacing fluid, patient's daughter wants a trial of IV albumin and Lasix, clearly told that Lasix at times can help but at times can cause worsening of renal function as well, however she said she is requesting for trial of Lasix  to see how the urine output and renal function does after it.  Will order a dose of IV albumin and Lasix, renal ultrasound is nonacute, no urinary retention on bladder scans.  Avoid nephrotoxins and continue to monitor.   Acute metabolic encephalopathy superimposed on dementia:  Etiology multifactorial likely due to oral intake on top of his underlying dementia, low-dose Seroquel, minimize benzodiazepines and narcotics, negative head CT, stable TSH and ammonia, UA does not show any UTI, continue to monitor bladder scans to rule out any retention.   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  [place] vas 2 score of greater than 3. Rate controlled continue beta-blocker and Eliquis.  As needed IV Cardizem.   Essential hypertension: As he is dehydrated will drop his home beta-blocker dose, as needed IV Cardizem and hydralazine on board.   Mild thrombocytopenia: Likely due to COVID-19 infection.  Monitor intermittently.   Recent acute urinary retention: Continue Flomax, monitor bladder scans, required Foley last admission.  Monitor.   Oropharyngeal dysphagia: Speech evaluated the patient now on a dysphagia 1 diet.  Dry oral mucosa with trach oral secretions coating his tongue, oral care per RN, nystatin swish and swallow although chances of him having fungal infection seem  very less on exam.   Severe protein calorie malnutrition.  Protein supplements if tolerated oral diet, IV albumin upon family request on 12/07/2022.    History of severe COVID-19 infection in 2021 with prolonged vent requirement trach and PEG (decannulated and PEG removed)/now with pulmonary fibrosis:  Noted has been weaned to room air.  Oral steroids switched to IV due to poor oral intake, chest x-ray nonacute currently on room air.  Continue to monitor.         Condition - Extremely Guarded  Family Communication  :   son [name] [phone]  detail on 12/05/2022, son [name] in detail along with patient's daughter over the phone on 12/07/2022 in the presence of critical care team, clearly explained that patient is actively dying.  They want more time to think about it.  Discussed with son [name] again on 12/07/2018 24 x 3 with PCCM attendings as well.  Called son [name] 01/01/2023 at 9:12 AM.  Micah Flesher to answering machine for cell phone switched off.  Code Status : DNR - son called 12/21/2022 - 11.09 am  Consults  : Palliative care, PCCM  PUD Prophylaxis : PPI   Procedures  :     CT head - non acute      Disposition Plan  :    Status is: Inpatient   DVT Prophylaxis  :         Lab Results  Component Value Date   PLT 142 (L) 12/11/2022    Diet :  Diet Order             DIET - DYS 1 Room service appropriate? Yes; Fluid consistency: Honey Thick  Diet effective now                    Inpatient Medications  Scheduled Meds:  (feeding supplement) PROSource Plus  30 mL Oral BID BM   acetylcysteine  3 mL Nebulization BID   amantadine  100 mg Oral Daily   apixaban  5 mg Oral BID   ipratropium  0.5 mg Nebulization BID  levalbuterol  0.63 mg Nebulization BID   megestrol  40 mg Oral Daily   methylPREDNISolone (SOLU-MEDROL) injection  30 mg Intravenous Q24H   metoprolol tartrate  5 mg Intravenous Q8H   mirtazapine  7.5 mg Oral QHS   nystatin  5 mL Oral QID   pantoprazole  (PROTONIX) IV  40 mg Intravenous Q24H   QUEtiapine  25 mg Oral BID   tamsulosin  0.4 mg Oral Daily   Continuous Infusions:  ampicillin-sulbactam (UNASYN) IV     lactated ringers 75 mL/hr at 12/02/2022 1810   PRN Meds:.acetaminophen, diltiazem, haloperidol lactate, hydrALAZINE, [DISCONTINUED] ondansetron **OR** ondansetron (ZOFRAN) IV, senna-docusate  Antibiotics  :    Anti-infectives (From admission, onward)    Start     Dose/Rate Route Frequency Ordered Stop   01/01/2023 0000  Ampicillin-Sulbactam (UNASYN) 3 g in sodium chloride 0.9 % 100 mL IVPB        3 g 200 mL/hr over 30 Minutes Intravenous Every 12 hours 12/30/2022 1503     12/07/22 0630  Ampicillin-Sulbactam (UNASYN) 3 g in sodium chloride 0.9 % 100 mL IVPB  Status:  Discontinued        3 g 200 mL/hr over 30 Minutes Intravenous Every 6 hours 12/07/22 0615 12/14/2022 1503         Objective:   Vitals:   12/31/2022 1239 12/24/2022 1300 12/27/2022 1400 12/10/2022 1600  BP: 92/64 (!) 82/60 (!) 91/53 (!) 87/51  Pulse: (!) 102 (!) 101 96 98  Resp: 20 (!) 23 (!) 30 (!) 23  Temp: 98.2 F (36.8 C)   98.4 F (36.9 C)  TempSrc: Axillary   Axillary  SpO2: 98% 97% 94% 97%  Weight:      Height:        Wt Readings from Last 3 Encounters:  12/05/22 62.3 kg  12/04/22 62.3 kg  07/18/21 78.6 kg     Intake/Output Summary (Last 24 hours) at 12/25/2022 0537 Last data filed at 12/22/2022 1810 Gross per 24 hour  Intake 600.5 ml  Output 100 ml  Net 500.5 ml     Physical Exam  Elderly, frail, cachectic African-American male, awake but confused,, No new F.N deficits, oral mucosa, mentation worse on 12/27/2022 [place].AT,PERRAL Supple Neck, No JVD,   Symmetrical Chest wall movement, Good air movement bilaterally, coarse bibasilar breath sounds right worse than left RRR,No Gallops,Rubs or new Murmurs,  +ve B.Sounds, Abd Soft, No tenderness,   No Cyanosis, Clubbing or edema        Data Review:    Recent Labs  Lab 12/02/22 0620  12/03/22 0517 12/05/22 1215 12/06/22 0457 12/07/22 0322 12/22/2022 0421  WBC 7.6 5.9 10.3 8.3 18.2* 17.9*  HGB 14.8 14.2 15.7 12.9* 15.0 15.0  HCT 44.2 42.6 49.4 39.5 45.7 46.9  PLT 142* 147* 159 181 197 142*  MCV 95.3 93.4 99.2 93.4 95.2 100.4*  MCH 31.9 31.1 31.5 30.5 31.3 32.1  MCHC 33.5 33.3 31.8 32.7 32.8 32.0  RDW 15.3 15.0 14.9 14.8 15.6* 16.3*  LYMPHSABS 0.8 0.8  --  0.4* 0.3* 0.2*  MONOABS 0.4 0.4  --  0.4 0.5 0.4  EOSABS 0.0 0.0  --  0.0 0.0 0.0  BASOSABS 0.0 0.0  --  0.0 0.1 0.0    Recent Labs  Lab 12/02/22 0620 12/02/22 0956 12/03/22 0517 12/05/22 1215 12/05/22 1647 12/06/22 0457 12/07/22 0322 12/07/22 0324 12/13/2022 0421  NA 142  --  141 138  --  136 144  --  145  K 4.5  --  4.2 5.6* 4.0 4.0 4.1  --  4.0  CL 103  --  99 98  --  95* 99  --  101  CO2 26  --  31 23  --  33* 32  --  33*  ANIONGAP 13  --  11 17*  --  8 13  --  11  GLUCOSE 125*  --  150* 91  --  204* 142*  --  118*  BUN 31*  --  30* 32*  --  32* 29*  --  28*  CREATININE 1.04  --  1.20 1.18  --  1.36* 1.49*  --  1.62*  AST  --   --   --  40  --   --  31  --   --   ALT  --   --   --  30  --   --  32  --   --   ALKPHOS  --   --   --  47  --   --  53  --   --   BILITOT  --   --   --  1.9*  --   --  1.6*  --   --   ALBUMIN  --   --   --  2.8*  --   --  2.8*  --   --   CRP 6.7*  --  4.6*  --  2.8* 2.3* 12.3*  --  26.5*  PROCALCITON  --  0.10 0.10  --   --   --  0.56  --   --   TSH  --   --   --   --  3.046  --   --   --   --   AMMONIA  --   --   --   --  24  --   --   --   --   BNP 939.5*  --  1,115.5*  --   --  1,052.7*  --  704.4* 587.8*  MG 1.9  --  2.1  --   --  1.9 1.9  --  2.0  CALCIUM 8.8*  --  8.7* 8.7*  --  8.3* 8.9  --  8.4*      Recent Labs  Lab 12/02/22 0620 12/02/22 0956 12/03/22 0517 12/05/22 1215 12/05/22 1647 12/06/22 0457 12/07/22 0322 12/07/22 0324 12/17/2022 0421  CRP 6.7*  --  4.6*  --  2.8* 2.3* 12.3*  --  26.5*  PROCALCITON  --  0.10 0.10  --   --   --  0.56  --   --    TSH  --   --   --   --  3.046  --   --   --   --   AMMONIA  --   --   --   --  24  --   --   --   --   BNP 939.5*  --  1,115.5*  --   --  1,052.7*  --  704.4* 587.8*  MG 1.9  --  2.1  --   --  1.9 1.9  --  2.0  CALCIUM 8.8*  --  8.7* 8.7*  --  8.3* 8.9  --  8.4*    --------------------------------------------------------------------------------------------------------------- No results found for: "CHOL", "HDL", "LDLCALC", "LDLDIRECT", "TRIG", "CHOLHDL"  Lab Results  Component Value Date   HGBA1C 5.8 (H) 03/10/2020   No results for input(s): "TSH", "T4TOTAL", "FREET4", "T3FREE", "THYROIDAB"  in the last 72 hours.  No results for input(s): "VITAMINB12", "FOLATE", "FERRITIN", "TIBC", "IRON", "RETICCTPCT" in the last 72 hours. ------------------------------------------------------------------------------------------------------------------ Cardiac Enzymes No results for input(s): "CKMB", "TROPONINI", "MYOGLOBIN" in the last 168 hours.  Invalid input(s): "CK"  Micro Results No results found for this or any previous visit (from the past 240 hour(s)).  Radiology Reports DG Chest Port 1 View  Result Date: 12/28/2022 CLINICAL DATA:  Shortness of breath and nausea. EXAM: PORTABLE ABDOMEN - 1 VIEW; PORTABLE CHEST - 1 VIEW COMPARISON:  Chest radiograph of yesterday. Most recent abdominal radiograph 11/24/2022 FINDINGS: Chest: Patient rotated to the right. Moderate cardiomegaly. Possible small right pleural effusion. Interstitial lung disease, with chronic diffuse interstitial coarsening, relative sparing of the left upper lung. Appearance is relatively similar to at least 01/19/2021. This makes evaluation for acute superimposed process challenging. No gross interval change. Abdomen: Single supine view of the abdomen and upper pelvis demonstrates contrast within normal caliber colon. No bowel obstruction. No gross free intraperitoneal air. No abnormal abdominal calcifications. Low pelvis excluded.  IMPRESSION: 1. Diffuse interstitial coarsening and scarring, likely related to interstitial lung disease. This makes evaluation for acute superimposed process challenging. No convincing evidence of acute process. 2. Moderate cardiomegaly with possible small right pleural effusion. 3. No acute process in the abdomen. Electronically Signed   By: Jeronimo Greaves M.D.   On: 12/12/2022 09:21   DG Abd Portable 1V  Result Date: 12/04/2022 CLINICAL DATA:  Shortness of breath and nausea. EXAM: PORTABLE ABDOMEN - 1 VIEW; PORTABLE CHEST - 1 VIEW COMPARISON:  Chest radiograph of yesterday. Most recent abdominal radiograph 11/24/2022 FINDINGS: Chest: Patient rotated to the right. Moderate cardiomegaly. Possible small right pleural effusion. Interstitial lung disease, with chronic diffuse interstitial coarsening, relative sparing of the left upper lung. Appearance is relatively similar to at least 01/19/2021. This makes evaluation for acute superimposed process challenging. No gross interval change. Abdomen: Single supine view of the abdomen and upper pelvis demonstrates contrast within normal caliber colon. No bowel obstruction. No gross free intraperitoneal air. No abnormal abdominal calcifications. Low pelvis excluded. IMPRESSION: 1. Diffuse interstitial coarsening and scarring, likely related to interstitial lung disease. This makes evaluation for acute superimposed process challenging. No convincing evidence of acute process. 2. Moderate cardiomegaly with possible small right pleural effusion. 3. No acute process in the abdomen. Electronically Signed   By: Jeronimo Greaves M.D.   On: 12/29/2022 09:21   DG Chest Port 1 View  Result Date: 12/07/2022 CLINICAL DATA:  Acute respiratory disease.  Shortness of breath. EXAM: PORTABLE CHEST 1 VIEW COMPARISON:  12/05/2022 FINDINGS: Shallow inspiration. Cardiac enlargement. Linear fibrosis or scarring in the lung bases. More prominent infiltration in the right perihilar region may  represent superimposed asymmetric edema or consolidation. Probable right pleural effusion. No pneumothorax. Calcified and tortuous aorta. Degenerative changes in the spine and shoulders. IMPRESSION: Cardiac enlargement. Chronic scarring in the lungs. Suggestion of developing perihilar infiltrate on the right. Small right pleural effusion. Electronically Signed   By: Burman Nieves M.D.   On: 12/07/2022 03:03   US RENAL  Result Date: 12/06/2022 CLINICAL DATA:  Acute renal insufficiency. EXAM: RENAL / URINARY TRACT ULTRASOUND COMPLETE COMPARISON:  None Available. FINDINGS: Evaluation is limited due to patient's positioning. Right Kidney: Renal measurements: 7.5 x 3.3 x 3.9 cm = volume: 50 mL. There is diffuse increased renal parenchymal echogenicity. No hydronephrosis or shadowing stone. Left Kidney: Renal measurements: 8.4 x 4.5 x 3.4 cm = volume: 67 mL. Diffuse increased renal parenchymal  echogenicity. No hydronephrosis or shadowing stone. Bladder: Appears normal for degree of bladder distention. Other: A right sided pleural effusion is partially visualized. IMPRESSION: 1. Echogenic kidneys may represent medical renal disease. No hydronephrosis or shadowing stone. 2. Right-sided pleural effusion. Electronically Signed   By: Elgie Collard M.D.   On: 12/06/2022 23:11   CT HEAD WO CONTRAST ( )  Result Date: 12/05/2022 CLINICAL DATA:  Altered mental status EXAM: CT HEAD WITHOUT CONTRAST TECHNIQUE: Contiguous axial images were obtained from the base of the skull through the vertex without intravenous contrast. RADIATION DOSE REDUCTION: This exam was performed according to the departmental dose-optimization program which includes automated exposure control, adjustment of the mA and/or kV according to patient size and/or use of iterative reconstruction technique. COMPARISON:  11/24/2022 FINDINGS: Brain: No evidence of acute infarction, hemorrhage, mass, mass effect, or midline shift. No hydrocephalus or  extra-axial fluid collection. Periventricular white matter changes, likely the sequela of chronic small vessel ischemic disease. Age related cerebral atrophy. Vascular: No hyperdense vessel. Skull: Negative for fracture or focal lesion. Sinuses/Orbits: No acute finding. Other: The mastoid air cells are well aerated. Redemonstrated metallic fragments in the scalp. Incidental note is made of high density material lining the patient's tongue and palate, which is new from the prior exam IMPRESSION: 1. No acute intracranial process. 2. Incidental note is made of high density material lining the patient's tongue and palate, which is new from the prior exam. Correlate with physical exam. Electronically Signed   By: Wiliam Ke M.D.   On: 12/05/2022 18:25   DG Chest Port 1 View  Result Date: 12/05/2022 CLINICAL DATA:  Weakness. EXAM: PORTABLE CHEST 1 VIEW COMPARISON:  12/02/2022. FINDINGS: Low lung volume. Redemonstration of diffuse chronic interstitial markings throughout bilateral lungs with relative sparing of the left upper and mid lung zones, essentially similar to the prior study. Findings may represent underlying lung fibrosis/scarring. No acute consolidation or lung collapse seen. There is probable small layering right pleural effusion. No significant left pleural effusion. No pneumothorax on either side. Stable cardio-mediastinal silhouette. No acute osseous abnormalities. The soft tissues are within normal limits. IMPRESSION: *Essentially stable exam. Redemonstration of diffuse chronic interstitial markings throughout bilateral lungs with relative sparing of the left upper and mid lung zones. *No acute consolidation or lung collapse. Electronically Signed   By: Jules Schick M.D.   On: 12/05/2022 14:00      Signature  -   Susa Raring M.D on 01/01/2023 at 5:37 AM   -  To page go to [email or web address]

## 2023-01-02 NOTE — Progress Notes (Signed)
Telemetry called about patient's HR being dropped slowly at around 1825,RN rushed to the room,HR was on 40s,was having agonal breathing,unresponsive.Code blue called,attending doctor made aware.Provided all non invasive medical intervention including medication.Patient was declared dead at 75.Family made aware.Attending doctor made aware.

## 2023-01-02 NOTE — Progress Notes (Signed)
SLP Cancellation Note  Patient Details Name: KAZMIR IMBRIANO MRN: [id number] DOB: 09-15-1933   Cancelled treatment:       Reason Eval/Treat Not Completed: Medical issues which prohibited therapy Patient on venti-mask, open mouth breathing, eyes closed, moving spontaneously a bit but non-responsive to sternal rub. Will continue to f/u.   Clever Geraldo MA, CCC-SLP   Diar Berkel Meryl 12/04/2022, 9:20 AM

## 2023-01-02 NOTE — Progress Notes (Signed)
Pharmacy Antibiotic Note  Robert Carpenter is a 89 y.o. male admitted on 12/05/2022, now with concern for aspiration pneumonia.  Pharmacy has been consulted for Unasyn dosing.  Update:  12/13/2022 SCr increased to 1.62,  CrCl 28.3 ml/min.  Will need to reduce Unasyn dose to q12h dosing interval.   Plan: Reduce Unasyn to 3g IV q12hr Monitor clinical status, renal function and culture results daily.    Height: 6\' 2"  (188 cm) Weight: 62.3 kg (137 lb 5.6 oz) IBW/kg (Calculated) : 82.2  Temp (24hrs), Avg:98 F (36.7 C), Min:97.4 F (36.3 C), Max:98.3 F (36.8 C)  Recent Labs  Lab 12/03/22 0517 12/05/22 1215 12/06/22 0457 12/07/22 0322 12/27/2022 0421  WBC 5.9 10.3 8.3 18.2* 17.9*  CREATININE 1.20 1.18 1.36* 1.49* 1.62*    Estimated Creatinine Clearance: 28.3 mL/min (A) (by C-G formula based on SCr of 1.62 mg/dL (H)).    No Known Allergies   Thank you for allowing pharmacy to be a part of this patient's care.  Noah Delaine, RPh Clinical Pharmacist  12/25/2022 3:03 PM

## 2023-01-02 NOTE — Plan of Care (Signed)
  Problem: Education: Goal: Knowledge of risk factors and measures for prevention of condition will improve Outcome: Not Progressing   Problem: Coping: Goal: Psychosocial and spiritual needs will be supported Outcome: Not Progressing   Problem: Respiratory: Goal: Will maintain a patent airway Outcome: Not Progressing Goal: Complications related to the disease process, condition or treatment will be avoided or minimized Outcome: Not Progressing   Problem: Education: Goal: Knowledge of General Education information will improve Description: Including pain rating scale, medication(s)/side effects and non-pharmacologic comfort measures Outcome: Not Progressing   Problem: Health Behavior/Discharge Planning: Goal: Ability to manage health-related needs will improve Outcome: Not Progressing   Problem: Clinical Measurements: Goal: Ability to maintain clinical measurements within normal limits will improve Outcome: Not Progressing Goal: Will remain free from infection Outcome: Not Progressing Goal: Diagnostic test results will improve Outcome: Not Progressing Goal: Respiratory complications will improve Outcome: Not Progressing Goal: Cardiovascular complication will be avoided Outcome: Not Progressing   Problem: Activity: Goal: Risk for activity intolerance will decrease Outcome: Not Progressing   Problem: Nutrition: Goal: Adequate nutrition will be maintained Outcome: Not Progressing

## 2023-01-02 DEATH — deceased

## 2023-01-15 MED FILL — Medication: Qty: 1 | Status: AC
